# Patient Record
Sex: Male | Born: 1940
Health system: Southern US, Community
[De-identification: ages and names within clinical notes are randomized; demographics above are authoritative.]

## PROBLEM LIST (undated history)

## (undated) DIAGNOSIS — N2889 Other specified disorders of kidney and ureter: Secondary | ICD-10-CM

## (undated) DIAGNOSIS — K635 Polyp of colon: Secondary | ICD-10-CM

## (undated) DIAGNOSIS — S42009A Fracture of unspecified part of unspecified clavicle, initial encounter for closed fracture: Secondary | ICD-10-CM

## (undated) DIAGNOSIS — Z8719 Personal history of other diseases of the digestive system: Secondary | ICD-10-CM

## (undated) DIAGNOSIS — G4730 Sleep apnea, unspecified: Secondary | ICD-10-CM

## (undated) DIAGNOSIS — C44509 Unspecified malignant neoplasm of skin of other part of trunk: Secondary | ICD-10-CM

## (undated) DIAGNOSIS — M1990 Unspecified osteoarthritis, unspecified site: Secondary | ICD-10-CM

## (undated) DIAGNOSIS — I499 Cardiac arrhythmia, unspecified: Secondary | ICD-10-CM

## (undated) DIAGNOSIS — R609 Edema, unspecified: Secondary | ICD-10-CM

## (undated) DIAGNOSIS — R519 Headache, unspecified: Secondary | ICD-10-CM

## (undated) DIAGNOSIS — R7303 Prediabetes: Secondary | ICD-10-CM

## (undated) DIAGNOSIS — I10 Essential (primary) hypertension: Secondary | ICD-10-CM

## (undated) HISTORY — DX: Essential (primary) hypertension: I10

## (undated) HISTORY — PX: OTHER SURGICAL HISTORY: SHX169

## (undated) HISTORY — DX: Unspecified osteoarthritis, unspecified site: M19.90

## (undated) HISTORY — DX: Other specified disorders of kidney and ureter: N28.89

## (undated) HISTORY — PX: FOOT SURGERY: SHX648

## (undated) HISTORY — DX: Polyp of colon: K63.5

## (undated) HISTORY — PX: SKIN CANCER EXCISION: SHX779

---

## 1968-01-22 HISTORY — PX: HERNIA REPAIR: SHX51

## 1968-01-22 HISTORY — PX: CHOLECYSTECTOMY: SHX55

## 1968-01-22 HISTORY — PX: FRACTURE SURGERY: SHX138

## 2001-01-21 DIAGNOSIS — K635 Polyp of colon: Secondary | ICD-10-CM

## 2001-01-21 HISTORY — DX: Polyp of colon: K63.5

## 2005-03-18 ENCOUNTER — Ambulatory Visit: Payer: Self-pay | Admitting: General Surgery

## 2008-03-22 ENCOUNTER — Ambulatory Visit: Payer: Self-pay | Admitting: General Surgery

## 2008-03-22 HISTORY — PX: COLONOSCOPY: SHX174

## 2008-03-22 LAB — HM COLONOSCOPY: HM Colonoscopy: NORMAL

## 2009-01-21 HISTORY — PX: BUNIONECTOMY WITH HAMMERTOE RECONSTRUCTION: SHX5600

## 2011-01-28 ENCOUNTER — Ambulatory Visit: Payer: Self-pay | Admitting: Family Medicine

## 2011-01-28 DIAGNOSIS — Z79899 Other long term (current) drug therapy: Secondary | ICD-10-CM | POA: Diagnosis not present

## 2011-01-28 DIAGNOSIS — M79609 Pain in unspecified limb: Secondary | ICD-10-CM | POA: Diagnosis not present

## 2011-01-28 DIAGNOSIS — M129 Arthropathy, unspecified: Secondary | ICD-10-CM | POA: Diagnosis not present

## 2011-01-28 DIAGNOSIS — N400 Enlarged prostate without lower urinary tract symptoms: Secondary | ICD-10-CM | POA: Diagnosis not present

## 2011-01-28 DIAGNOSIS — I10 Essential (primary) hypertension: Secondary | ICD-10-CM | POA: Diagnosis not present

## 2011-02-28 DIAGNOSIS — M79609 Pain in unspecified limb: Secondary | ICD-10-CM | POA: Diagnosis not present

## 2011-02-28 DIAGNOSIS — M545 Low back pain: Secondary | ICD-10-CM | POA: Diagnosis not present

## 2011-02-28 DIAGNOSIS — I10 Essential (primary) hypertension: Secondary | ICD-10-CM | POA: Diagnosis not present

## 2011-02-28 DIAGNOSIS — N400 Enlarged prostate without lower urinary tract symptoms: Secondary | ICD-10-CM | POA: Diagnosis not present

## 2011-07-30 DIAGNOSIS — Z0000 Encounter for general adult medical examination without abnormal findings: Secondary | ICD-10-CM | POA: Diagnosis not present

## 2011-07-30 DIAGNOSIS — Z136 Encounter for screening for cardiovascular disorders: Secondary | ICD-10-CM | POA: Diagnosis not present

## 2011-07-30 DIAGNOSIS — Z23 Encounter for immunization: Secondary | ICD-10-CM | POA: Diagnosis not present

## 2011-07-30 DIAGNOSIS — N400 Enlarged prostate without lower urinary tract symptoms: Secondary | ICD-10-CM | POA: Diagnosis not present

## 2011-07-30 DIAGNOSIS — M79609 Pain in unspecified limb: Secondary | ICD-10-CM | POA: Diagnosis not present

## 2011-07-30 DIAGNOSIS — I10 Essential (primary) hypertension: Secondary | ICD-10-CM | POA: Diagnosis not present

## 2011-07-30 DIAGNOSIS — Z125 Encounter for screening for malignant neoplasm of prostate: Secondary | ICD-10-CM | POA: Diagnosis not present

## 2011-09-16 DIAGNOSIS — H2510 Age-related nuclear cataract, unspecified eye: Secondary | ICD-10-CM | POA: Diagnosis not present

## 2011-12-17 DIAGNOSIS — L570 Actinic keratosis: Secondary | ICD-10-CM | POA: Diagnosis not present

## 2011-12-17 DIAGNOSIS — L821 Other seborrheic keratosis: Secondary | ICD-10-CM | POA: Diagnosis not present

## 2012-01-02 DIAGNOSIS — Z23 Encounter for immunization: Secondary | ICD-10-CM | POA: Diagnosis not present

## 2012-01-02 DIAGNOSIS — N400 Enlarged prostate without lower urinary tract symptoms: Secondary | ICD-10-CM | POA: Diagnosis not present

## 2012-01-27 DIAGNOSIS — I10 Essential (primary) hypertension: Secondary | ICD-10-CM | POA: Diagnosis not present

## 2012-01-27 DIAGNOSIS — R51 Headache: Secondary | ICD-10-CM | POA: Diagnosis not present

## 2012-01-27 DIAGNOSIS — E669 Obesity, unspecified: Secondary | ICD-10-CM | POA: Diagnosis not present

## 2012-01-27 DIAGNOSIS — M129 Arthropathy, unspecified: Secondary | ICD-10-CM | POA: Diagnosis not present

## 2012-01-31 ENCOUNTER — Ambulatory Visit: Payer: Self-pay | Admitting: Family Medicine

## 2012-01-31 DIAGNOSIS — R51 Headache: Secondary | ICD-10-CM | POA: Diagnosis not present

## 2012-02-10 DIAGNOSIS — R9431 Abnormal electrocardiogram [ECG] [EKG]: Secondary | ICD-10-CM | POA: Diagnosis not present

## 2012-02-10 DIAGNOSIS — I10 Essential (primary) hypertension: Secondary | ICD-10-CM | POA: Diagnosis not present

## 2012-02-10 DIAGNOSIS — E669 Obesity, unspecified: Secondary | ICD-10-CM | POA: Diagnosis not present

## 2012-02-10 DIAGNOSIS — I498 Other specified cardiac arrhythmias: Secondary | ICD-10-CM | POA: Diagnosis not present

## 2012-02-12 DIAGNOSIS — I10 Essential (primary) hypertension: Secondary | ICD-10-CM | POA: Diagnosis not present

## 2012-02-12 DIAGNOSIS — E785 Hyperlipidemia, unspecified: Secondary | ICD-10-CM | POA: Diagnosis not present

## 2012-02-12 DIAGNOSIS — I4949 Other premature depolarization: Secondary | ICD-10-CM | POA: Diagnosis not present

## 2012-02-12 DIAGNOSIS — R9431 Abnormal electrocardiogram [ECG] [EKG]: Secondary | ICD-10-CM | POA: Diagnosis not present

## 2012-02-12 DIAGNOSIS — R002 Palpitations: Secondary | ICD-10-CM | POA: Diagnosis not present

## 2012-02-18 DIAGNOSIS — I209 Angina pectoris, unspecified: Secondary | ICD-10-CM | POA: Diagnosis not present

## 2012-02-18 DIAGNOSIS — I4949 Other premature depolarization: Secondary | ICD-10-CM | POA: Diagnosis not present

## 2012-02-20 DIAGNOSIS — I209 Angina pectoris, unspecified: Secondary | ICD-10-CM | POA: Diagnosis not present

## 2012-02-20 DIAGNOSIS — R002 Palpitations: Secondary | ICD-10-CM | POA: Diagnosis not present

## 2012-02-20 DIAGNOSIS — I4949 Other premature depolarization: Secondary | ICD-10-CM | POA: Diagnosis not present

## 2012-02-20 DIAGNOSIS — R9431 Abnormal electrocardiogram [ECG] [EKG]: Secondary | ICD-10-CM | POA: Diagnosis not present

## 2012-02-27 DIAGNOSIS — I10 Essential (primary) hypertension: Secondary | ICD-10-CM | POA: Diagnosis not present

## 2012-02-27 DIAGNOSIS — I059 Rheumatic mitral valve disease, unspecified: Secondary | ICD-10-CM | POA: Diagnosis not present

## 2012-02-27 DIAGNOSIS — I4949 Other premature depolarization: Secondary | ICD-10-CM | POA: Diagnosis not present

## 2012-06-23 DIAGNOSIS — I10 Essential (primary) hypertension: Secondary | ICD-10-CM | POA: Diagnosis not present

## 2012-06-23 DIAGNOSIS — I498 Other specified cardiac arrhythmias: Secondary | ICD-10-CM | POA: Diagnosis not present

## 2012-06-23 DIAGNOSIS — E669 Obesity, unspecified: Secondary | ICD-10-CM | POA: Diagnosis not present

## 2012-06-23 DIAGNOSIS — R011 Cardiac murmur, unspecified: Secondary | ICD-10-CM | POA: Diagnosis not present

## 2012-09-16 DIAGNOSIS — H2510 Age-related nuclear cataract, unspecified eye: Secondary | ICD-10-CM | POA: Diagnosis not present

## 2012-10-22 DIAGNOSIS — I10 Essential (primary) hypertension: Secondary | ICD-10-CM | POA: Diagnosis not present

## 2012-10-22 DIAGNOSIS — I498 Other specified cardiac arrhythmias: Secondary | ICD-10-CM | POA: Diagnosis not present

## 2012-10-22 DIAGNOSIS — Z23 Encounter for immunization: Secondary | ICD-10-CM | POA: Diagnosis not present

## 2012-10-22 DIAGNOSIS — Z0000 Encounter for general adult medical examination without abnormal findings: Secondary | ICD-10-CM | POA: Diagnosis not present

## 2012-10-22 DIAGNOSIS — Z125 Encounter for screening for malignant neoplasm of prostate: Secondary | ICD-10-CM | POA: Diagnosis not present

## 2012-10-22 DIAGNOSIS — E669 Obesity, unspecified: Secondary | ICD-10-CM | POA: Diagnosis not present

## 2012-10-22 DIAGNOSIS — Z1331 Encounter for screening for depression: Secondary | ICD-10-CM | POA: Diagnosis not present

## 2012-10-22 DIAGNOSIS — Z1339 Encounter for screening examination for other mental health and behavioral disorders: Secondary | ICD-10-CM | POA: Diagnosis not present

## 2012-12-16 DIAGNOSIS — D235 Other benign neoplasm of skin of trunk: Secondary | ICD-10-CM | POA: Diagnosis not present

## 2012-12-16 DIAGNOSIS — L570 Actinic keratosis: Secondary | ICD-10-CM | POA: Diagnosis not present

## 2012-12-16 DIAGNOSIS — L821 Other seborrheic keratosis: Secondary | ICD-10-CM | POA: Diagnosis not present

## 2013-03-01 DIAGNOSIS — I050 Rheumatic mitral stenosis: Secondary | ICD-10-CM | POA: Diagnosis not present

## 2013-03-01 DIAGNOSIS — I10 Essential (primary) hypertension: Secondary | ICD-10-CM | POA: Diagnosis not present

## 2013-03-01 DIAGNOSIS — K2100 Gastro-esophageal reflux disease with esophagitis, without bleeding: Secondary | ICD-10-CM | POA: Diagnosis not present

## 2013-03-01 DIAGNOSIS — E785 Hyperlipidemia, unspecified: Secondary | ICD-10-CM | POA: Diagnosis not present

## 2013-03-24 ENCOUNTER — Ambulatory Visit (INDEPENDENT_AMBULATORY_CARE_PROVIDER_SITE_OTHER): Payer: Medicare Other | Admitting: General Surgery

## 2013-03-24 ENCOUNTER — Encounter: Payer: Self-pay | Admitting: General Surgery

## 2013-03-24 VITALS — BP 130/72 | HR 76 | Resp 12 | Ht 71.0 in | Wt 268.0 lb

## 2013-03-24 DIAGNOSIS — Z86010 Personal history of colonic polyps: Secondary | ICD-10-CM | POA: Diagnosis not present

## 2013-03-24 MED ORDER — POLYETHYLENE GLYCOL 3350 17 GM/SCOOP PO POWD: ORAL | Status: DC

## 2013-03-24 NOTE — Patient Instructions (Addendum)
Colonoscopy A colonoscopy is an exam to look at the entire large intestine (colon). This exam can help find problems such as tumors, polyps, inflammation, and areas of bleeding. The exam takes about 1 hour.  LET [facility] CARE PROVIDER KNOW ABOUT:   Any allergies you have.  All medicines you are taking, including vitamins, herbs, eye drops, creams, and over-the-counter medicines.  Previous problems you or members of your family have had with the use of anesthetics.  Any blood disorders you have.  Previous surgeries you have had.  Medical conditions you have. RISKS AND COMPLICATIONS  Generally, this is a safe procedure. However, as with any procedure, complications can occur. Possible complications include:  Bleeding.  Tearing or rupture of the colon wall.  Reaction to medicines given during the exam.  Infection (rare). BEFORE THE PROCEDURE   Ask your health care provider about changing or stopping your regular medicines.  You may be prescribed an oral bowel prep. This involves drinking a large amount of medicated liquid, starting the day before your procedure. The liquid will cause you to have multiple loose stools until your stool is almost clear or light green. This cleans out your colon in preparation for the procedure.  Do not eat or drink anything else once you have started the bowel prep, unless your health care provider tells you it is safe to do so.  Arrange for someone to drive you home after the procedure. PROCEDURE   You will be given medicine to help you relax (sedative).  You will lie on your side with your knees bent.  A long, flexible tube with a light and camera on the end (colonoscope) will be inserted through the rectum and into the colon. The camera sends video back to a computer screen as it moves through the colon. The colonoscope also releases carbon dioxide gas to inflate the colon. This helps your health care provider see the area better.  During  the exam, your health care provider may take a small tissue sample (biopsy) to be examined under a microscope if any abnormalities are found.  The exam is finished when the entire colon has been viewed. AFTER THE PROCEDURE   Do not drive for 24 hours after the exam.  You may have a small amount of blood in your stool.  You may pass moderate amounts of gas and have mild abdominal cramping or bloating. This is caused by the gas used to inflate your colon during the exam.  Ask when your test results will be ready and how you will get your results. Make sure you get your test results. Document Released: 01/05/2000 Document Revised: 10/28/2012 Document Reviewed: 09/14/2012 [facility] Patient Information 2014 [place].  Patient has been scheduled for a colonoscopy on 04-13-13 at [facility]. This patient has been asked to decrease aspirin to an 81 mg aspirin starting one week prior to procedure.

## 2013-03-24 NOTE — Progress Notes (Signed)
Patient ID: Patrick Thornton, male   DOB: 09-09-1940, 73 y.o.   MRN: [id number]  Chief Complaint  Patient presents with  . Colonoscopy    HPI Patrick Thornton is a 73 y.o. male who presents for an evaluation of a colonoscopy. His last colonoscopy was performed in 2010. Bowels move daily with no complications.   Normal stress test and echocardiogram in 2014 with Dr Nehemiah Massed.  HPI  Past Medical History  Diagnosis Date  . Arthritis   . Hypertension   . Colon polyp 2003    Past Surgical History  Procedure Laterality Date  . Hernia repair    . Cholecystectomy    . Colonoscopy  03-22-08    Dr Bary Castilla    Family History  Problem Relation Age of Onset  . Stroke Mother   . Cancer Father     melanoma    Social History History  Substance Use Topics  . Smoking status: Former Smoker -- 40 years  . Smokeless tobacco: Never Used  . Alcohol Use: No    No Known Allergies  Current Outpatient Prescriptions  Medication Sig Dispense Refill  . Cholecalciferol (VITAMIN D-3 PO) Take 1 tablet by mouth daily.      . Cyanocobalamin (VITAMIN B-12 PO) Take 1 tablet by mouth daily.      Marland Kitchen doxazosin (CARDURA) 8 MG tablet       . furosemide (LASIX) 20 MG tablet       . ibuprofen (ADVIL,MOTRIN) 800 MG tablet every 8 (eight) hours as needed.       . Multiple Vitamin (MULTIVITAMIN) tablet Take 1 tablet by mouth daily.      . quinapril (ACCUPRIL) 20 MG tablet       . temazepam (RESTORIL) 30 MG capsule at bedtime as needed.       . polyethylene glycol powder (GLYCOLAX/MIRALAX) powder 255 grams one bottle for colonoscopy prep  255 g  0   No current facility-administered medications for this visit.    Review of Systems Review of Systems  Constitutional: Negative.   Respiratory: Negative.   Cardiovascular: Negative.   Gastrointestinal: Negative for nausea, vomiting, diarrhea and blood in stool.    Blood pressure 130/72, pulse 76, resp. rate 12, height 5\' 11"  (1.803 m), weight 268 lb (121.564  kg).  Physical Exam Physical Exam  Constitutional: He is oriented to person, place, and time. He appears well-developed and well-nourished.  Neck: Neck supple.  Cardiovascular: Normal rate, regular rhythm and normal heart sounds.   Pulmonary/Chest: Effort normal and breath sounds normal.  Lymphadenopathy:    He has no cervical adenopathy.  Neurological: He is alert and oriented to person, place, and time.  Skin: Skin is warm and dry.    Data Reviewed Colonoscopy completed March 2010 with Versed and fentanyl showed no recurrent polyps.  2007 colonoscopy showed mucosa with hyperplastic changes.  January 2003 colonoscopy showed a large tubulovillous adenoma of the rectum at 3 cm. The largest fragment measured 1 cm in diameter.  Assessment    Previous tubulovillous no lobe the rectum 2003.  No current lower GI symptoms.     Plan    The patient is a candidate for a follow up exam based on the size of the 2003 polyp. This will be a screening exam. Colonoscopy with possible biopsy/polypectomy prn: Information regarding the procedure, including its potential risks and complications (including but not limited to perforation of the bowel, which may require emergency surgery to repair, and bleeding) was verbally given  to the patient. Educational information regarding lower instestinal endoscopy was given to the patient. Written instructions for how to complete the bowel prep using Miralax were provided. The importance of drinking ample fluids to avoid dehydration as a result of the prep emphasized.     Patient has been scheduled for a colonoscopy on 04-13-13 at [facility]. This patient has been asked to decrease aspirin to an 81 mg aspirin starting one week prior to procedure.    Robert Bellow 03/25/2013, 4:56 PM

## 2013-03-25 ENCOUNTER — Other Ambulatory Visit: Payer: Self-pay | Admitting: General Surgery

## 2013-03-25 DIAGNOSIS — Z1211 Encounter for screening for malignant neoplasm of colon: Secondary | ICD-10-CM

## 2013-03-25 DIAGNOSIS — Z860100 Personal history of colon polyps, unspecified: Secondary | ICD-10-CM | POA: Insufficient documentation

## 2013-04-13 ENCOUNTER — Ambulatory Visit: Payer: Self-pay | Admitting: General Surgery

## 2013-04-13 DIAGNOSIS — K639 Disease of intestine, unspecified: Secondary | ICD-10-CM | POA: Diagnosis not present

## 2013-04-13 DIAGNOSIS — Z823 Family history of stroke: Secondary | ICD-10-CM | POA: Diagnosis not present

## 2013-04-13 DIAGNOSIS — Z79899 Other long term (current) drug therapy: Secondary | ICD-10-CM | POA: Diagnosis not present

## 2013-04-13 DIAGNOSIS — D129 Benign neoplasm of anus and anal canal: Secondary | ICD-10-CM

## 2013-04-13 DIAGNOSIS — Z86010 Personal history of colonic polyps: Secondary | ICD-10-CM | POA: Diagnosis not present

## 2013-04-13 DIAGNOSIS — Z808 Family history of malignant neoplasm of other organs or systems: Secondary | ICD-10-CM | POA: Diagnosis not present

## 2013-04-13 DIAGNOSIS — Z87891 Personal history of nicotine dependence: Secondary | ICD-10-CM | POA: Diagnosis not present

## 2013-04-13 DIAGNOSIS — D128 Benign neoplasm of rectum: Secondary | ICD-10-CM | POA: Diagnosis not present

## 2013-04-13 DIAGNOSIS — Z1211 Encounter for screening for malignant neoplasm of colon: Secondary | ICD-10-CM | POA: Diagnosis not present

## 2013-04-13 DIAGNOSIS — K6389 Other specified diseases of intestine: Secondary | ICD-10-CM | POA: Diagnosis not present

## 2013-04-13 DIAGNOSIS — I10 Essential (primary) hypertension: Secondary | ICD-10-CM | POA: Diagnosis not present

## 2013-04-13 DIAGNOSIS — M129 Arthropathy, unspecified: Secondary | ICD-10-CM | POA: Diagnosis not present

## 2013-04-14 ENCOUNTER — Encounter: Payer: Self-pay | Admitting: General Surgery

## 2013-04-16 ENCOUNTER — Encounter: Payer: Self-pay | Admitting: General Surgery

## 2013-04-20 ENCOUNTER — Telehealth: Payer: Self-pay | Admitting: *Deleted

## 2013-04-20 ENCOUNTER — Encounter: Payer: Self-pay | Admitting: General Surgery

## 2013-04-20 LAB — PATHOLOGY REPORT

## 2013-04-20 NOTE — Telephone Encounter (Signed)
Message copied by Carson Myrtle on Tue Apr 20, 2013  4:53 PM ------      Message from: Robert Bellow      Created: Tue Apr 20, 2013  1:08 PM       Please notify the patient the biopsies were fine.  F/U exams if any problems. (We stop screening at age 73) ------

## 2013-04-20 NOTE — Telephone Encounter (Signed)
Notified patient wife as instructed, she was pleased. Discussed follow-up appointments on an as needed basis, she agrees.

## 2013-10-27 DIAGNOSIS — Z1212 Encounter for screening for malignant neoplasm of rectum: Secondary | ICD-10-CM | POA: Diagnosis not present

## 2013-10-27 DIAGNOSIS — Z23 Encounter for immunization: Secondary | ICD-10-CM | POA: Diagnosis not present

## 2013-10-27 DIAGNOSIS — Z0000 Encounter for general adult medical examination without abnormal findings: Secondary | ICD-10-CM | POA: Diagnosis not present

## 2013-10-27 DIAGNOSIS — Z125 Encounter for screening for malignant neoplasm of prostate: Secondary | ICD-10-CM | POA: Diagnosis not present

## 2013-10-27 DIAGNOSIS — Z1389 Encounter for screening for other disorder: Secondary | ICD-10-CM | POA: Diagnosis not present

## 2013-11-25 DIAGNOSIS — L821 Other seborrheic keratosis: Secondary | ICD-10-CM | POA: Diagnosis not present

## 2013-11-25 DIAGNOSIS — D225 Melanocytic nevi of trunk: Secondary | ICD-10-CM | POA: Diagnosis not present

## 2013-11-25 DIAGNOSIS — D2272 Melanocytic nevi of left lower limb, including hip: Secondary | ICD-10-CM | POA: Diagnosis not present

## 2013-11-25 DIAGNOSIS — D2261 Melanocytic nevi of right upper limb, including shoulder: Secondary | ICD-10-CM | POA: Diagnosis not present

## 2013-11-25 DIAGNOSIS — L570 Actinic keratosis: Secondary | ICD-10-CM | POA: Diagnosis not present

## 2013-11-29 DIAGNOSIS — I340 Nonrheumatic mitral (valve) insufficiency: Secondary | ICD-10-CM | POA: Insufficient documentation

## 2013-11-29 DIAGNOSIS — R002 Palpitations: Secondary | ICD-10-CM | POA: Diagnosis not present

## 2013-11-29 DIAGNOSIS — I10 Essential (primary) hypertension: Secondary | ICD-10-CM | POA: Diagnosis not present

## 2014-04-26 LAB — BASIC METABOLIC PANEL WITH GFR
BUN: 11 mg/dL (ref 4–21)
Creatinine: 1 mg/dL (ref 0.6–1.3)
Glucose: 100 mg/dL
Potassium: 4.3 mmol/L (ref 3.4–5.3)
Sodium: 142 mmol/L (ref 137–147)

## 2014-04-26 LAB — CBC AND DIFFERENTIAL
HEMATOCRIT: 42 % (ref 41–53)
Hemoglobin: 14.3 g/dL (ref 13.5–17.5)
NEUTROS ABS: 4 /uL
Platelets: 243 10*3/uL (ref 150–399)
WBC: 6.9 10*3/mL

## 2014-04-26 LAB — HEPATIC FUNCTION PANEL
ALT: 16 U/L (ref 10–40)
AST: 19 U/L (ref 14–40)
Alkaline Phosphatase: 86 U/L (ref 25–125)

## 2014-04-26 LAB — LIPID PANEL
Cholesterol: 142 mg/dL (ref 0–200)
HDL: 38 mg/dL (ref 35–70)
LDL Cholesterol: 94 mg/dL
LDl/HDL Ratio: 2.5
Triglycerides: 51 mg/dL (ref 40–160)

## 2014-04-26 LAB — TSH: TSH: 5.13 u[IU]/mL (ref 0.41–5.90)

## 2014-11-01 ENCOUNTER — Ambulatory Visit (INDEPENDENT_AMBULATORY_CARE_PROVIDER_SITE_OTHER): Payer: PPO | Admitting: Family Medicine

## 2014-11-01 ENCOUNTER — Encounter: Payer: Self-pay | Admitting: Family Medicine

## 2014-11-01 VITALS — BP 130/64 | HR 64 | Temp 97.7°F | Resp 16 | Ht 71.0 in | Wt 271.0 lb

## 2014-11-01 DIAGNOSIS — Z23 Encounter for immunization: Secondary | ICD-10-CM | POA: Diagnosis not present

## 2014-11-01 DIAGNOSIS — N400 Enlarged prostate without lower urinary tract symptoms: Secondary | ICD-10-CM | POA: Insufficient documentation

## 2014-11-01 DIAGNOSIS — E669 Obesity, unspecified: Secondary | ICD-10-CM | POA: Insufficient documentation

## 2014-11-01 DIAGNOSIS — Z0000 Encounter for general adult medical examination without abnormal findings: Secondary | ICD-10-CM | POA: Diagnosis not present

## 2014-11-01 DIAGNOSIS — G4700 Insomnia, unspecified: Secondary | ICD-10-CM | POA: Insufficient documentation

## 2014-11-01 DIAGNOSIS — M1990 Unspecified osteoarthritis, unspecified site: Secondary | ICD-10-CM | POA: Insufficient documentation

## 2014-11-01 DIAGNOSIS — N529 Male erectile dysfunction, unspecified: Secondary | ICD-10-CM | POA: Insufficient documentation

## 2014-11-01 DIAGNOSIS — K219 Gastro-esophageal reflux disease without esophagitis: Secondary | ICD-10-CM | POA: Insufficient documentation

## 2014-11-01 DIAGNOSIS — E785 Hyperlipidemia, unspecified: Secondary | ICD-10-CM | POA: Insufficient documentation

## 2014-11-01 DIAGNOSIS — I10 Essential (primary) hypertension: Secondary | ICD-10-CM | POA: Insufficient documentation

## 2014-11-01 NOTE — Progress Notes (Signed)
Patient ID: Patrick Thornton, male   DOB: 24-Aug-1940, 74 y.o.   MRN: [id number] Patient: Patrick Thornton, Male    DOB: 06-28-1940, 74 y.o.   MRN: [id number] Visit Date: 11/01/2014  Today's Provider: [name], MD   Chief Complaint  Patient presents with  . Annual Exam   Subjective:   Patrick Thornton is a 74 y.o. male who presents today for his Subsequent Annual Wellness Visit. He feels well. He reports he is not exercising. He reports he is sleeping well.  Review of Systems  Constitutional: Negative.   HENT: Positive for tinnitus.   Eyes: Positive for redness.  Respiratory: Negative.   Cardiovascular: Positive for leg swelling.  Gastrointestinal: Negative.   Endocrine: Negative.   Genitourinary: Negative.   Musculoskeletal: Positive for arthralgias.  Skin: Negative.   Allergic/Immunologic: Negative.   Neurological: Negative.   Hematological: Bruises/bleeds easily.  Psychiatric/Behavioral: Negative.     Patient Active Problem List   Diagnosis Date Noted  . Benign fibroma of prostate 11/01/2014  . ED (erectile dysfunction) of organic origin 11/01/2014  . Acid reflux 11/01/2014  . HLD (hyperlipidemia) 11/01/2014  . BP (high blood pressure) 11/01/2014  . Adiposity 11/01/2014  . Arthritis, degenerative 11/01/2014  . Cannot sleep 11/01/2014  . MI (mitral incompetence) 11/29/2013  . Personal history of colonic polyps 03/25/2013    Social History   Social History  . Marital Status: Married    Spouse Name: N/A  . Number of Children: N/A  . Years of Education: N/A   Occupational History  . Not on file.   Social History Main Topics  . Smoking status: Former Smoker -- 40 years  . Smokeless tobacco: Never Used  . Alcohol Use: No  . Drug Use: No  . Sexual Activity: Not on file   Other Topics Concern  . Not on file   Social History Narrative    Past Surgical History  Procedure Laterality Date  . Hernia repair    . Cholecystectomy    . Colonoscopy  03-22-08     Dr Bary Castilla  . Other surgical history      transanal excision of a villous adenoma  . Fracture surgery      His family history includes CAD in his brother; COPD in his son; Cancer in his father; Diabetes in his father and son; Heart attack in his father and son; Hyperlipidemia in his brother; Hypertension in his father; Obesity in his son; Stroke in his mother.    Outpatient Prescriptions Prior to Visit  Medication Sig Dispense Refill  . Ascorbic Acid (VITAMIN C) 100 MG tablet Take by mouth.    Marland Kitchen aspirin 81 MG tablet Take by mouth.    . Cholecalciferol (VITAMIN D-3 PO) Take 1 tablet by mouth daily.    . cyanocobalamin 1000 MCG tablet Take by mouth.    . doxazosin (CARDURA) 8 MG tablet     . furosemide (LASIX) 20 MG tablet     . ibuprofen (ADVIL,MOTRIN) 800 MG tablet every 8 (eight) hours as needed.     . Multiple Vitamin tablet Take by mouth.    . polyethylene glycol powder (GLYCOLAX/MIRALAX) powder 255 grams one bottle for colonoscopy prep 255 g 0  . quinapril (ACCUPRIL) 20 MG tablet     . temazepam (RESTORIL) 30 MG capsule at bedtime as needed.     . vitamin E 200 UNIT capsule Take by mouth.    . Cinnamon 500 MG capsule Take by mouth.    Marland Kitchen  Cyanocobalamin (VITAMIN B-12 PO) Take 1 tablet by mouth daily.    . Multiple Vitamin (MULTIVITAMIN) tablet Take 1 tablet by mouth daily.     No facility-administered medications prior to visit.    No Known Allergies  Patient Care Team: Jerrol Banana., MD as PCP - General (Family Medicine) Robert Bellow, MD (General Surgery)  Objective:   Vitals:  Filed Vitals:   11/01/14 1106  BP: 130/64  Pulse: 64  Temp: 97.7 F (36.5 C)  TempSrc: Oral  Resp: 16  Height: 5\' 11"  (1.803 m)  Weight: 271 lb (122.925 kg)    Physical Exam  Constitutional: He is oriented to person, place, and time. He appears well-developed and well-nourished.  Moderately obese white male in no acute distress  HENT:  Head: Normocephalic and  atraumatic.  Right Ear: External ear normal.  Left Ear: External ear normal.  Nose: Nose normal.  Mouth/Throat: Oropharynx is clear and moist.  Eyes: Conjunctivae are normal.  Neck: Neck supple.  Cardiovascular: Normal rate, regular rhythm, normal heart sounds and intact distal pulses.   Pulmonary/Chest: Effort normal and breath sounds normal.  Abdominal: Soft.  Neurological: He is oriented to person, place, and time.  Skin: Skin is warm and dry.  Psychiatric: He has a normal mood and affect. His behavior is normal. Judgment and thought content normal.    Activities of Daily Living In your present state of health, do you have any difficulty performing the following activities: 11/01/2014  Hearing? N  Vision? N  Difficulty concentrating or making decisions? N  Walking or climbing stairs? N  Dressing or bathing? N  Doing errands, shopping? N    Fall Risk Assessment Fall Risk  11/01/2014  Falls in the past year? No     Depression Screen PHQ 2/9 Scores 11/01/2014  PHQ - 2 Score 0    Cognitive Testing - 6-CIT    Year: 0 points  Month: 0 points  Memorize "Pia Mau, [address], [place]"  Time (within 1 hour:) 0 points  Count backwards from 20: 0  points  Name months of year: 0  points  Repeat Address: 0 points   Total Score: 0/28  Interpretation : Normal (0-7) Abnormal (8-28)    Assessment & Plan:     Annual Wellness Visit  Reviewed patient's Family Medical History Reviewed and updated list of patient's medical providers Assessment of cognitive impairment was done Assessed patient's functional ability Established a written schedule for health screening [place] Completed and Reviewed  Exercise Activities and Dietary recommendations Goals    None      Immunization History  Administered Date(s) Administered  . Influenza-Unspecified 10/21/2013  . Pneumococcal Conjugate-13 10/27/2013  . Pneumococcal Polysaccharide-23 07/30/2011     Health Maintenance  Topic Date Due  . TETANUS/TDAP  10/10/1960  . ZOSTAVAX  10/10/2001  . PNA vac Low Risk Adult (1 of 2 - PCV13) 10/11/2006  . INFLUENZA VACCINE  08/22/2014  . COLONOSCOPY  04/14/2023   Prevnar 10/27/13. Colonoscopy 03/22/08--5 year f/u so time to repeat if pt willing. Discussed health benefits of physical activity, and encouraged him to engage in regular exercise appropriate for his age and condition.    Miguel Aschoff MD [facility] 11/01/2014 11:10 AM  ------------------------------------------------------------------------------------------------------------

## 2015-01-19 ENCOUNTER — Other Ambulatory Visit: Payer: Self-pay | Admitting: Family Medicine

## 2015-02-20 ENCOUNTER — Ambulatory Visit (INDEPENDENT_AMBULATORY_CARE_PROVIDER_SITE_OTHER): Payer: PPO | Admitting: Family Medicine

## 2015-02-20 VITALS — BP 134/74 | HR 64 | Temp 98.2°F | Resp 16 | Wt 275.0 lb

## 2015-02-20 DIAGNOSIS — J019 Acute sinusitis, unspecified: Secondary | ICD-10-CM

## 2015-02-20 MED ORDER — DOXYCYCLINE HYCLATE 100 MG PO TABS: 100.0000 mg | ORAL_TABLET | Freq: Two times a day (BID) | ORAL | Status: DC

## 2015-02-20 MED ORDER — HYDROCODONE-HOMATROPINE 5-1.5 MG/5ML PO SYRP: 5.0000 mL | ORAL_SOLUTION | Freq: Three times a day (TID) | ORAL | Status: DC | PRN

## 2015-02-20 NOTE — Progress Notes (Signed)
Patient ID: Patrick Thornton, male   DOB: January 23, 1940, 75 y.o.   MRN: [id number]   Patrick Thornton  MRN: [id number] DOB: 11-05-1940  Subjective:  HPI   1. Acute sinusitis, recurrence not specified, unspecified location The patient is a 75 year old male who presents for evaluation of possible sinusitis.  He states his symptoms have been present for 1 week.  The include congestion, cough from drainage, eye and teeth pain, ribs sore from coughing so much and the cough is keeping him awake at night.  Patient Active Problem List   Diagnosis Date Noted  . Benign fibroma of prostate 11/01/2014  . ED (erectile dysfunction) of organic origin 11/01/2014  . Acid reflux 11/01/2014  . HLD (hyperlipidemia) 11/01/2014  . BP (high blood pressure) 11/01/2014  . Adiposity 11/01/2014  . Arthritis, degenerative 11/01/2014  . Cannot sleep 11/01/2014  . MI (mitral incompetence) 11/29/2013  . Personal history of colonic polyps 03/25/2013    Past Medical History  Diagnosis Date  . Arthritis   . Hypertension   . Colon polyp 2003    Social History   Social History  . Marital Status: Married    Spouse Name: N/A  . Number of Children: N/A  . Years of Education: N/A   Occupational History  . Not on file.   Social History Main Topics  . Smoking status: Former Smoker -- 40 years  . Smokeless tobacco: Never Used  . Alcohol Use: No  . Drug Use: No  . Sexual Activity: Not on file   Other Topics Concern  . Not on file   Social History Narrative    Outpatient Prescriptions Prior to Visit  Medication Sig Dispense Refill  . Ascorbic Acid (VITAMIN C) 100 MG tablet Take by mouth.    Marland Kitchen aspirin 81 MG tablet Take by mouth.    . Cholecalciferol (VITAMIN D-3 PO) Take 1 tablet by mouth daily.    . cyanocobalamin 1000 MCG tablet Take by mouth.    . doxazosin (CARDURA) 8 MG tablet     . furosemide (LASIX) 20 MG tablet     . ibuprofen (ADVIL,MOTRIN) 800 MG tablet every 8 (eight) hours as needed.     .  Multiple Vitamin tablet Take by mouth.    . polyethylene glycol powder (GLYCOLAX/MIRALAX) powder 255 grams one bottle for colonoscopy prep 255 g 0  . quinapril (ACCUPRIL) 20 MG tablet TAKE 1 TABLET BY MOUTH ONCE A DAY 30 tablet 5  . temazepam (RESTORIL) 30 MG capsule at bedtime as needed.     . vitamin E 200 UNIT capsule Take by mouth.     No facility-administered medications prior to visit.    No Known Allergies  Review of Systems  Constitutional: Positive for chills, malaise/fatigue and diaphoresis. Negative for fever.  HENT: Positive for congestion (with blood when he blows), sore throat (from mouth breathing his throat gets dry) and tinnitus (chronic and unchanged). Negative for ear discharge, ear pain, hearing loss and nosebleeds.   Eyes: Positive for pain and discharge. Negative for blurred vision, double vision, photophobia and redness.  Respiratory: Positive for cough and wheezing. Negative for hemoptysis, sputum production and shortness of breath.   Cardiovascular: Positive for orthopnea and leg swelling. Negative for chest pain and palpitations.  Neurological: Positive for weakness and headaches.   Objective:  BP 134/74 mmHg  Pulse 64  Temp(Src) 98.2 F (36.8 C) (Oral)  Resp 16  Wt 275 lb (124.739 kg)  Physical Exam  Constitutional: He  is oriented to person, place, and time and well-developed, well-nourished, and in no distress.  HENT:  Head: Normocephalic and atraumatic.  Right Ear: External ear normal.  Left Ear: External ear normal.  Nose: Nose normal.  Mouth/Throat: Oropharynx is clear and moist.  Eyes: Conjunctivae are normal.  Cardiovascular: Normal rate, regular rhythm and normal heart sounds.   Pulmonary/Chest: Effort normal and breath sounds normal.  Abdominal: Soft.  Neurological: He is alert and oriented to person, place, and time. Gait normal.  Skin: Skin is warm and dry.  Psychiatric: Mood, memory, affect and judgment normal.    Assessment and Plan :   Acute sinusitis, recurrence not specified, unspecified location I have done the exam and reviewed the above chart and it is accurate to the best of my knowledge.  Patrick Aschoff MD [facility] 02/20/2015 2:55 PM

## 2015-04-26 ENCOUNTER — Telehealth: Payer: Self-pay | Admitting: Family Medicine

## 2015-04-26 ENCOUNTER — Ambulatory Visit: Payer: PPO | Admitting: Family Medicine

## 2015-04-26 ENCOUNTER — Other Ambulatory Visit: Payer: Self-pay | Admitting: Family Medicine

## 2015-04-26 NOTE — Telephone Encounter (Signed)
Pt wife called back to scheduled an appointment for tomorrow at 3:15 with Dr Wilmon Arms

## 2015-04-26 NOTE — Telephone Encounter (Signed)
Pt wife states pt has a cough and congestion that started yesterday.   Pt wife states pt does not want to come in to the office.  Pt is requesting a Rx called in to help with this.  [employer].  [id number]

## 2015-04-26 NOTE — Telephone Encounter (Signed)
Please review-aa 

## 2015-04-27 ENCOUNTER — Encounter: Payer: Self-pay | Admitting: Family Medicine

## 2015-04-27 ENCOUNTER — Ambulatory Visit (INDEPENDENT_AMBULATORY_CARE_PROVIDER_SITE_OTHER): Payer: PPO | Admitting: Family Medicine

## 2015-04-27 VITALS — BP 138/70 | HR 78 | Temp 99.9°F | Resp 18 | Wt 279.0 lb

## 2015-04-27 DIAGNOSIS — J42 Unspecified chronic bronchitis: Secondary | ICD-10-CM

## 2015-04-27 DIAGNOSIS — R52 Pain, unspecified: Secondary | ICD-10-CM | POA: Diagnosis not present

## 2015-04-27 DIAGNOSIS — J019 Acute sinusitis, unspecified: Secondary | ICD-10-CM

## 2015-04-27 LAB — POC INFLUENZA A&B (BINAX/QUICKVUE)
Influenza A, POC: NEGATIVE
Influenza B, POC: NEGATIVE

## 2015-04-27 MED ORDER — HYDROCODONE-HOMATROPINE 5-1.5 MG/5ML PO SYRP: 5.0000 mL | ORAL_SOLUTION | Freq: Three times a day (TID) | ORAL | Status: DC | PRN

## 2015-04-27 MED ORDER — BENZONATATE 200 MG PO CAPS: 200.0000 mg | ORAL_CAPSULE | Freq: Three times a day (TID) | ORAL | Status: DC | PRN

## 2015-04-27 MED ORDER — DOXYCYCLINE HYCLATE 100 MG PO TABS
100.0000 mg | ORAL_TABLET | Freq: Two times a day (BID) | ORAL | Status: DC
Start: 2015-04-27 — End: 2015-05-10

## 2015-04-27 NOTE — Progress Notes (Signed)
Patient ID: Patrick Thornton, male   DOB: 01/12/1941, 75 y.o.   MRN: [id number]    Subjective:  HPI Pt reports that he started feeling bad on Monday (4 days ago) and yesterday and today he feels worse. He has a productive cough with a pink tint to the sputum, body aches, chills, sweats, sore throat, nausea, hoarseness, watery, painful/burning eyes, shortness of breath and muscle pain from coughing so much. He has felt like he has had a fever but has not checked it. He reports that he id going to the [employer] and wants to be feeling better. He does not have any nasal congestion, or ear pain. He would like a refill on his hycodan and his daughter told him about a coughing pill that would help him during the day with the cough.   Prior to Admission medications   Medication Sig Start Date End Date Taking? Authorizing Provider  Ascorbic Acid (VITAMIN C) 100 MG tablet Take by mouth. 07/30/11  Yes Historical Provider, MD  aspirin 81 MG tablet Take by mouth. 07/30/11  Yes Historical Provider, MD  Cholecalciferol (VITAMIN D-3 PO) Take 1 tablet by mouth daily.   Yes Historical Provider, MD  cyanocobalamin 1000 MCG tablet Take by mouth. 07/30/11  Yes Historical Provider, MD  doxazosin (CARDURA) 8 MG tablet  03/17/13  Yes Historical Provider, MD  furosemide (LASIX) 20 MG tablet  03/04/13  Yes Historical Provider, MD  HYDROcodone-homatropine (HYCODAN) 5-1.5 MG/5ML syrup Take 5 mLs by mouth every 8 (eight) hours as needed for cough. 02/20/15  Yes Richard Maceo Pro., MD  ibuprofen (ADVIL,MOTRIN) 800 MG tablet every 8 (eight) hours as needed.  01/23/13  Yes Historical Provider, MD  Multiple Vitamin tablet Take by mouth. 07/30/11  Yes Historical Provider, MD  polyethylene glycol powder (GLYCOLAX/MIRALAX) powder 255 grams one bottle for colonoscopy prep 03/24/13  Yes Robert Bellow, MD  quinapril (ACCUPRIL) 20 MG tablet TAKE 1 TABLET BY MOUTH ONCE A DAY 01/20/15  Yes Jerrol Banana., MD  temazepam (RESTORIL)  30 MG capsule at bedtime as needed.  03/22/13  Yes Historical Provider, MD  vitamin E 200 UNIT capsule Take by mouth. 07/30/11  Yes Historical Provider, MD    Patient Active Problem List   Diagnosis Date Noted  . Benign fibroma of prostate 11/01/2014  . ED (erectile dysfunction) of organic origin 11/01/2014  . Acid reflux 11/01/2014  . HLD (hyperlipidemia) 11/01/2014  . BP (high blood pressure) 11/01/2014  . Adiposity 11/01/2014  . Arthritis, degenerative 11/01/2014  . Cannot sleep 11/01/2014  . MI (mitral incompetence) 11/29/2013  . Personal history of colonic polyps 03/25/2013    Past Medical History  Diagnosis Date  . Arthritis   . Hypertension   . Colon polyp 2003    Social History   Social History  . Marital Status: Married    Spouse Name: N/A  . Number of Children: N/A  . Years of Education: N/A   Occupational History  . Not on file.   Social History Main Topics  . Smoking status: Former Smoker -- 40 years  . Smokeless tobacco: Never Used  . Alcohol Use: No  . Drug Use: No  . Sexual Activity: Not on file   Other Topics Concern  . Not on file   Social History Narrative    No Known Allergies  Review of Systems  Constitutional: Positive for chills, malaise/fatigue and diaphoresis.  HENT: Positive for congestion and sore throat.   Eyes: Positive for  pain, discharge and redness.  Respiratory: Positive for cough, sputum production and shortness of breath.   Cardiovascular: Negative.   Gastrointestinal: Positive for nausea.  Genitourinary: Negative.   Musculoskeletal: Positive for myalgias.  Skin: Negative.   Neurological: Positive for weakness.  Endo/Heme/Allergies: Negative.   Psychiatric/Behavioral: Negative.     Immunization History  Administered Date(s) Administered  . Influenza, High Dose Seasonal PF 11/01/2014  . Influenza-Unspecified 10/21/2013  . Pneumococcal Conjugate-13 10/27/2013  . Pneumococcal Polysaccharide-23 07/30/2011   Objective:    BP 138/70 mmHg  Pulse 78  Temp(Src) 99.9 F (37.7 C) (Oral)  Resp 18  Wt 279 lb (126.554 kg)  SpO2 97%  Physical Exam  Constitutional: He is well-developed, well-nourished, and in no distress.  HENT:  Head: Normocephalic and atraumatic.  Right Ear: External ear normal.  Left Ear: External ear normal.  Nose: Nose normal.  Mouth/Throat: Oropharynx is clear and moist.  Eyes: Conjunctivae and EOM are normal. Pupils are equal, round, and reactive to light.  Neck: Normal range of motion. Neck supple.  Cardiovascular: Normal rate, regular rhythm, normal heart sounds and intact distal pulses.   Pulmonary/Chest: Effort normal and breath sounds normal.  Abdominal: Soft.  Neurological: He is alert. He has normal reflexes. Gait normal. GCS score is 15.  Skin: Skin is warm and dry.  Psychiatric: Mood, memory, affect and judgment normal.    Lab Results  Component Value Date   WBC 6.9 04/26/2014   HGB 14.3 04/26/2014   HCT 42 04/26/2014   PLT 243 04/26/2014   CHOL 142 04/26/2014   TRIG 51 04/26/2014   HDL 38 04/26/2014   LDLCALC 94 04/26/2014   TSH 5.13 04/26/2014    CMP     Component Value Date/Time   NA 142 04/26/2014   K 4.3 04/26/2014   BUN 11 04/26/2014   CREATININE 1.0 04/26/2014   AST 19 04/26/2014   ALT 16 04/26/2014   ALKPHOS 86 04/26/2014    Assessment and Plan :  1. Body aches  - POC Influenza A&B(BINAX/QUICKVUE)-- Negative.  2. Acute sinusitis, recurrence not specified, unspecified location   3. Chronic bronchitis, unspecified chronic bronchitis type ([place])  - HYDROcodone-homatropine (HYCODAN) 5-1.5 MG/5ML syrup; Take 5 mLs by mouth every 8 (eight) hours as needed for cough.  Dispense: 120 mL; Refill: 0 - benzonatate (TESSALON) 200 MG capsule; Take 1 capsule (200 mg total) by mouth 3 (three) times daily as needed for cough.  Dispense: 20 capsule; Refill: 0 - doxycycline (VIBRA-TABS) 100 MG tablet; Take 1 tablet (100 mg total) by mouth 2 (two) times daily.   Dispense: 20 tablet; Refill: 0  I have done the exam and reviewed the above chart and it is accurate to the best of my knowledge.  Patient was seen and examined by Dr. Miguel Aschoff, and noted scribed by Webb Laws, [place] MD [facility] 04/27/2015 10:15 AM

## 2015-04-30 ENCOUNTER — Emergency Department
Admission: EM | Admit: 2015-04-30 | Discharge: 2015-04-30 | Disposition: A | Discharge status: Home / Self Care | Payer: PPO | Attending: Emergency Medicine | Admitting: Emergency Medicine

## 2015-04-30 ENCOUNTER — Emergency Department: Payer: PPO

## 2015-04-30 ENCOUNTER — Encounter: Payer: Self-pay | Admitting: *Deleted

## 2015-04-30 ENCOUNTER — Other Ambulatory Visit: Payer: Self-pay

## 2015-04-30 DIAGNOSIS — I10 Essential (primary) hypertension: Secondary | ICD-10-CM | POA: Diagnosis not present

## 2015-04-30 DIAGNOSIS — Z9049 Acquired absence of other specified parts of digestive tract: Secondary | ICD-10-CM | POA: Insufficient documentation

## 2015-04-30 DIAGNOSIS — J0100 Acute maxillary sinusitis, unspecified: Secondary | ICD-10-CM | POA: Insufficient documentation

## 2015-04-30 DIAGNOSIS — Z86010 Personal history of colonic polyps: Secondary | ICD-10-CM | POA: Insufficient documentation

## 2015-04-30 DIAGNOSIS — R05 Cough: Secondary | ICD-10-CM | POA: Diagnosis not present

## 2015-04-30 DIAGNOSIS — Z79899 Other long term (current) drug therapy: Secondary | ICD-10-CM | POA: Insufficient documentation

## 2015-04-30 DIAGNOSIS — R0602 Shortness of breath: Secondary | ICD-10-CM | POA: Insufficient documentation

## 2015-04-30 DIAGNOSIS — Z7982 Long term (current) use of aspirin: Secondary | ICD-10-CM | POA: Diagnosis not present

## 2015-04-30 DIAGNOSIS — J069 Acute upper respiratory infection, unspecified: Secondary | ICD-10-CM | POA: Insufficient documentation

## 2015-04-30 DIAGNOSIS — M1990 Unspecified osteoarthritis, unspecified site: Secondary | ICD-10-CM | POA: Insufficient documentation

## 2015-04-30 DIAGNOSIS — Z87891 Personal history of nicotine dependence: Secondary | ICD-10-CM | POA: Diagnosis not present

## 2015-04-30 DIAGNOSIS — E785 Hyperlipidemia, unspecified: Secondary | ICD-10-CM | POA: Diagnosis not present

## 2015-04-30 LAB — BASIC METABOLIC PANEL
Anion gap: 4 — ABNORMAL LOW (ref 5–15)
BUN: 13 mg/dL (ref 6–20)
CALCIUM: 8.2 mg/dL — AB (ref 8.9–10.3)
CO2: 26 mmol/L (ref 22–32)
CREATININE: 0.85 mg/dL (ref 0.61–1.24)
Chloride: 106 mmol/L (ref 101–111)
GFR calc non Af Amer: >60 mL/min (ref >60)
GLUCOSE: 110 mg/dL — AB (ref 65–99)
Potassium: 3.8 mmol/L (ref 3.5–5.1)
Sodium: 136 mmol/L (ref 135–145)

## 2015-04-30 LAB — CBC
HEMATOCRIT: 38.7 % — AB (ref 40.0–52.0)
Hemoglobin: 13.1 g/dL (ref 13.0–18.0)
MCH: 32 pg (ref 26.0–34.0)
MCHC: 34 g/dL (ref 32.0–36.0)
MCV: 94.2 fL (ref 80.0–100.0)
Platelets: 188 10*3/uL (ref 150–440)
RBC: 4.11 MIL/uL — ABNORMAL LOW (ref 4.40–5.90)
RDW: 13.8 % (ref 11.5–14.5)
WBC: 3.8 10*3/uL (ref 3.8–10.6)

## 2015-04-30 LAB — RAPID INFLUENZA A&B ANTIGENS
Influenza A (ARMC): NEGATIVE
Influenza B (ARMC): NEGATIVE

## 2015-04-30 LAB — BRAIN NATRIURETIC PEPTIDE: B Natriuretic Peptide: 245 pg/mL — ABNORMAL HIGH (ref 0.0–100.0)

## 2015-04-30 MED ORDER — PREDNISONE 20 MG PO TABS: 60.0000 mg | ORAL_TABLET | Freq: Every day | ORAL | Status: DC

## 2015-04-30 MED ORDER — IPRATROPIUM-ALBUTEROL 0.5-2.5 (3) MG/3ML IN SOLN
3.0000 mL | Freq: Once | RESPIRATORY_TRACT | Status: AC
  Administered 2015-04-30: 3 mL via RESPIRATORY_TRACT
  Filled 2015-04-30: qty 3

## 2015-04-30 MED ORDER — ALBUTEROL SULFATE HFA 108 (90 BASE) MCG/ACT IN AERS: 2.0000 | INHALATION_SPRAY | Freq: Four times a day (QID) | RESPIRATORY_TRACT | Status: DC | PRN

## 2015-04-30 MED ORDER — AMOXICILLIN-POT CLAVULANATE 875-125 MG PO TABS
1.0000 | ORAL_TABLET | Freq: Two times a day (BID) | ORAL | Status: AC
Start: 2015-04-30 — End: 2015-05-10

## 2015-04-30 NOTE — ED Notes (Signed)
Pt alert and oriented X4, active, cooperative, pt in NAD. RR even and unlabored, color WNL.  Pt informed to return if any life threatening symptoms occur.   

## 2015-04-30 NOTE — Discharge Instructions (Signed)
Please stop taking doxycycline. Take the entire course of Augmentin, even if you are feeling better.  Please make an appointment for follow-up with your primary care physician in 2-3 days.  Please return to the emergency department if you develop shortness of breath, chest pain, lightheadedness or fainting, or any other symptoms concerning to you.

## 2015-04-30 NOTE — ED Notes (Signed)
Patient ambulated around nursing station without difficulty. Patient maintained O2 sat of 92-96% during ambulation. Patient reports some worsening of shortness of breath with ambulation.

## 2015-04-30 NOTE — ED Notes (Signed)
Patient states that he has been having shortness of breath since Friday. Patient was seen by his PCP on Thursday for nasal congestion, he was tested for flu and that came back negative. Patient was given antibiotic and cough med.  Patient states that he is more short of breath when walking

## 2015-04-30 NOTE — ED Provider Notes (Signed)
[facility] Emergency Department Provider Note  ____________________________________________  Time seen: Approximately 10:42 AM  I have reviewed the triage vital signs and the nursing notes.   HISTORY  Chief Complaint Nasal Congestion and Shortness of Breath    HPI Patrick Thornton is a 75 y.o. male with a history of HTN presenting with shortness of breath. The patient reports that for the past 4-5 days he has been having a cough productive of thin occasionally blood-tinged sputum associated with frontal sinus pressure, congestion and rhinorrhea, bilateral eye "burning" without discharge. For the past day he has been having progressively worsening exertional shortness of breath with decreased exercise tolerance. He is not having any chest pain, palpitations, lightheadedness or syncope. He did have fever to 102 several days ago. No nausea vomiting or diarrhea, no worsening leg swelling. The patient was placed on doxycycline by his PMD on 4/6, but his symptoms have not improved.   Past Medical History  Diagnosis Date  . Arthritis   . Hypertension   . Colon polyp 2003    Patient Active Problem List   Diagnosis Date Noted  . Benign fibroma of prostate 11/01/2014  . ED (erectile dysfunction) of organic origin 11/01/2014  . Acid reflux 11/01/2014  . HLD (hyperlipidemia) 11/01/2014  . BP (high blood pressure) 11/01/2014  . Adiposity 11/01/2014  . Arthritis, degenerative 11/01/2014  . Cannot sleep 11/01/2014  . MI (mitral incompetence) 11/29/2013  . Personal history of colonic polyps 03/25/2013    Past Surgical History  Procedure Laterality Date  . Hernia repair    . Cholecystectomy    . Colonoscopy  03-22-08    Dr Bary Castilla  . Other surgical history      transanal excision of a villous adenoma  . Fracture surgery      Current Outpatient Rx  Name  Route  Sig  Dispense  Refill  . Ascorbic Acid (VITAMIN C) 100 MG tablet   Oral   Take 100 mg by mouth  daily.          Marland Kitchen aspirin 81 MG tablet   Oral   Take 81 mg by mouth daily.          . benzonatate (TESSALON) 200 MG capsule   Oral   Take 1 capsule (200 mg total) by mouth 3 (three) times daily as needed for cough.   20 capsule   0   . Cholecalciferol (VITAMIN D-3 PO)   Oral   Take 1 tablet by mouth daily.         . cyanocobalamin 1000 MCG tablet   Oral   Take 1,000 mcg by mouth daily.          Marland Kitchen doxazosin (CARDURA) 8 MG tablet   Oral   Take 8 mg by mouth daily.          Marland Kitchen doxycycline (VIBRA-TABS) 100 MG tablet   Oral   Take 1 tablet (100 mg total) by mouth 2 (two) times daily.   20 tablet   0   . furosemide (LASIX) 20 MG tablet   Oral   Take 20 mg by mouth daily.          Marland Kitchen HYDROcodone-homatropine (HYCODAN) 5-1.5 MG/5ML syrup   Oral   Take 5 mLs by mouth every 8 (eight) hours as needed for cough.   120 mL   0   . ibuprofen (ADVIL,MOTRIN) 800 MG tablet   Oral   Take 800 mg by mouth every 8 (eight) hours  as needed.          . Multiple Vitamin tablet   Oral   Take 1 tablet by mouth daily.          . quinapril (ACCUPRIL) 20 MG tablet      TAKE 1 TABLET BY MOUTH ONCE A DAY   30 tablet   5   . temazepam (RESTORIL) 30 MG capsule   Oral   Take 30 mg by mouth at bedtime as needed.          . vitamin E 200 UNIT capsule   Oral   Take 400 Units by mouth.          Marland Kitchen albuterol (PROVENTIL HFA;VENTOLIN HFA) 108 (90 Base) MCG/ACT inhaler   Inhalation   Inhale 2 puffs into the lungs every 6 (six) hours as needed for wheezing or shortness of breath.   1 Inhaler   0   . amoxicillin-clavulanate (AUGMENTIN) 875-125 MG tablet   Oral   Take 1 tablet by mouth every 12 (twelve) hours.   28 tablet   0   . predniSONE (DELTASONE) 20 MG tablet   Oral   Take 3 tablets (60 mg total) by mouth daily.   15 tablet   0     Allergies Review of patient's allergies indicates no known allergies.  Family History  Problem Relation Age of Onset  .  Stroke Mother   . Cancer Father     melanoma  . Diabetes Father   . Hypertension Father   . Heart attack Father   . Hyperlipidemia Brother   . Diabetes Son   . COPD Son   . Obesity Son   . Heart attack Son   . CAD Brother     Social History Social History  Substance Use Topics  . Smoking status: Former Smoker -- 40 years  . Smokeless tobacco: Never Used  . Alcohol Use: No    Review of Systems Constitutional: Positive fever, no chills.  No lightheadedness or syncope.  Positive decreased exercise tolerance due to sob. Eyes: No visual changes. Positive lacrimation and burning sensation in the eyes bilaterally. ENT: No sore throat. Positive congestion and rhinorrhea.  Positive frontal sinus pressure. Cardiovascular: Denies chest pain. Denies palpitations. Respiratory: Positive shortness of breath.  Positive cough. Gastrointestinal: No abdominal pain.  No nausea, no vomiting.  No diarrhea.  No constipation. Genitourinary: Negative for dysuria. Musculoskeletal: Negative for back pain. Skin: Negative for rash. Neurological: Negative for headaches. No focal numbness, tingling or weakness.   10-point ROS otherwise negative.  ____________________________________________   PHYSICAL EXAM:  VITAL SIGNS: ED Triage Vitals  Enc Vitals Group     BP 04/30/15 1005 144/53 mmHg     Pulse Rate 04/30/15 1005 87     Resp 04/30/15 1005 18     Temp 04/30/15 1005 98.3 F (36.8 C)     Temp Source 04/30/15 1005 Oral     SpO2 04/30/15 1005 94 %     Weight 04/30/15 1005 275 lb (124.739 kg)     Height 04/30/15 1005 5\' 11"  (1.803 m)     Head Cir --      Peak Flow --      Pain Score --      Pain Loc --      Pain Edu? --      Excl. in [place]? --     Constitutional: Alert and oriented. Mildly uncomfortable appearing but in no acute distress. Answers questions appropriately. Eyes: Conjunctivae  are normal.  EOMI. No scleral icterus.No eye discharge. Positive bilateral conjunctival  erythema. Head: Atraumatic. Positive tenderness over the maxillary and frontal sinuses. Nose: Positive congestion/rhinnorhea. Mouth/Throat: Mucous membranes are moist.  Neck: No stridor.  Supple.  No JVD. Cardiovascular: Normal rate, regular rhythm. No murmurs, rubs or gallops.  Respiratory: Mild tachypnea.  No accessory muscle use or retractions. Lungs CTAB.  No wheezes, rales or ronchi. Gastrointestinal: Soft, nontender and nondistended.  No guarding or rebound.  No peritoneal signs. Musculoskeletal: Positive symmetric bilateral lower extremity edema to the mid tibial shaft. No ttp in the calves or palpable cords.  Negative Homan's sign. Neurologic:  A&Ox3.  Speech is clear.  Face and smile are symmetric.  EOMI.  Moves all extremities well. Skin:  Skin is warm, dry and intact. No rash noted. Psychiatric: Mood and affect are normal. Speech and behavior are normal.  Normal judgement.  ____________________________________________   LABS (all labs ordered are listed, but only abnormal results are displayed)  Labs Reviewed  CBC - Abnormal; Notable for the following:    RBC 4.11 (*)    HCT 38.7 (*)    All other components within normal limits  BASIC METABOLIC PANEL - Abnormal; Notable for the following:    Glucose, Bld 110 (*)    Calcium 8.2 (*)    Anion gap 4 (*)    All other components within normal limits  RAPID INFLUENZA A&B ANTIGENS ([facility] ONLY)  BRAIN NATRIURETIC PEPTIDE   ____________________________________________  EKG  ED ECG REPORT I, Eula Listen, the attending physician, personally viewed and interpreted this ECG.   Date: 04/30/2015  EKG Time: 1002  Rate: 89  Rhythm: normal sinus rhythm  Axis: Leftward  Intervals:none  ST&T Change: Nonspecific T-wave inversions in V1. No ST elevation.  ____________________________________________  RADIOLOGY  Dg Chest 2 View  04/30/2015  CLINICAL DATA:  Pt to ED c/o productive cough, shortness of breath worsened with  exertion x 1 week, states cough and difficulty breathing are worse at night and when laying down, h/o htn, no other known heart/lung conditions, denies ever being dx with pneumonia EXAM: CHEST  2 VIEW COMPARISON:  None. FINDINGS: Cardiac silhouette is normal in size. No mediastinal or hilar masses or evidence of adenopathy. There is prominence of the bronchovascular markings. No lung consolidation to suggest pneumonia. No pulmonary edema. No pleural effusion or pneumothorax. The bony thorax is demineralized but grossly intact. IMPRESSION: No acute cardiopulmonary disease. Electronically Signed   By: Lajean Manes M.D.   On: 04/30/2015 11:11    ____________________________________________   PROCEDURES  Procedure(s) performed: None  Critical Care performed: No ____________________________________________   INITIAL IMPRESSION / ASSESSMENT AND PLAN / ED COURSE  Pertinent labs & imaging results that were available during my care of the patient were reviewed by me and considered in my medical decision making (see chart for details).  75 y.o. male with 4-5 days of fever, cough, frontal and maxillary sinus pressure, congestion and rhinorrhea presenting for shortness of breath with exertion and decreased exercise tolerance. The patient is not having any chest pain and his symptoms are most consistent with a viral URI or sinusitis.  I'll get a chest x-ray to rule out pneumonia. We also evaluate for influenza. In the meantime, I'll treat the patient symptomatically and reevaluate him after his evaluation is complete.  ----------------------------------------- 12:29 PM on 04/30/2015 -----------------------------------------  The patient is feeling better after DuoNeb. He was ambulated and maintained oxygen saturations of 92% and greater throughout the entirety  of his long walk. His chest x-ray does not show pneumonia. I will plan to send him home with Augmentin to treat sinusitis. I've asked him to  discontinue his doxycycline. He will follow-up with his primary care physician on Tuesday.  ____________________________________________  FINAL CLINICAL IMPRESSION(S) / ED DIAGNOSES  Final diagnoses:  Exertional shortness of breath  URI (upper respiratory infection)  Acute maxillary sinusitis, recurrence not specified      NEW MEDICATIONS STARTED DURING THIS VISIT:  New Prescriptions   ALBUTEROL (PROVENTIL HFA;VENTOLIN HFA) 108 (90 BASE) MCG/ACT INHALER    Inhale 2 puffs into the lungs every 6 (six) hours as needed for wheezing or shortness of breath.   AMOXICILLIN-CLAVULANATE (AUGMENTIN) 875-125 MG TABLET    Take 1 tablet by mouth every 12 (twelve) hours.   PREDNISONE (DELTASONE) 20 MG TABLET    Take 3 tablets (60 mg total) by mouth daily.     Eula Listen, MD 04/30/15 1322

## 2015-04-30 NOTE — ED Notes (Signed)
States cough, congestion, and SOB since last week, states he was negative for the flu at PCP, states pink tinged productive cough

## 2015-04-30 NOTE — ED Notes (Signed)
Report given to Ally, RN 

## 2015-04-30 NOTE — ED Notes (Signed)
Pt finished with breathing tx.

## 2015-05-02 ENCOUNTER — Ambulatory Visit: Payer: PPO | Admitting: Family Medicine

## 2015-05-10 ENCOUNTER — Other Ambulatory Visit: Payer: Self-pay | Admitting: Family Medicine

## 2015-05-10 ENCOUNTER — Encounter: Payer: Self-pay | Admitting: Family Medicine

## 2015-05-10 ENCOUNTER — Ambulatory Visit (INDEPENDENT_AMBULATORY_CARE_PROVIDER_SITE_OTHER): Payer: PPO | Admitting: Family Medicine

## 2015-05-10 VITALS — BP 128/68 | Temp 98.5°F | Resp 20 | Ht 71.0 in | Wt 272.0 lb

## 2015-05-10 DIAGNOSIS — J320 Chronic maxillary sinusitis: Secondary | ICD-10-CM | POA: Diagnosis not present

## 2015-05-10 DIAGNOSIS — J42 Unspecified chronic bronchitis: Secondary | ICD-10-CM

## 2015-05-10 MED ORDER — HYDROCODONE-HOMATROPINE 5-1.5 MG/5ML PO SYRP: 5.0000 mL | ORAL_SOLUTION | Freq: Three times a day (TID) | ORAL | Status: DC | PRN

## 2015-05-10 MED ORDER — CLARITHROMYCIN 500 MG PO TABS: 500.0000 mg | ORAL_TABLET | Freq: Two times a day (BID) | ORAL | Status: DC

## 2015-05-10 NOTE — Progress Notes (Signed)
Patient ID: JALYNN HUTZELL, male   DOB: 1940-04-28, 75 y.o.   MRN: [id number]       Patient: Patrick Thornton Male    DOB: 1940/04/28   75 y.o.   MRN: [id number] Visit Date: 05/10/2015  Today's Provider: [name], MD   Chief Complaint  Patient presents with  . Hypertension    6 month follow up   . Sinusitis   Subjective:    Hypertension The problem is controlled. Associated symptoms include shortness of breath. Pertinent negatives include no blurred vision, chest pain or headaches. (Patient has shortness of breath due to cough) There are no compliance problems.   Sinusitis There has been no fever. Associated symptoms include congestion, coughing, a hoarse voice, shortness of breath and sinus pressure. Pertinent negatives include no headaches. Past treatments include antibiotics.   Patient reports that he is still on antibiotics (Augmentin) for URI. He reports that he has shortness of breath with exertion and he still has a cough.     No Known Allergies Previous Medications   ALBUTEROL (PROVENTIL HFA;VENTOLIN HFA) 108 (90 BASE) MCG/ACT INHALER    Inhale 2 puffs into the lungs every 6 (six) hours as needed for wheezing or shortness of breath.   AMOXICILLIN-CLAVULANATE (AUGMENTIN) 875-125 MG TABLET    Take 1 tablet by mouth every 12 (twelve) hours.   ASCORBIC ACID (VITAMIN C) 100 MG TABLET    Take 100 mg by mouth daily.    ASPIRIN 81 MG TABLET    Take 81 mg by mouth daily.    BENZONATATE (TESSALON) 200 MG CAPSULE    Take 1 capsule (200 mg total) by mouth 3 (three) times daily as needed for cough.   CHOLECALCIFEROL (VITAMIN D-3 PO)    Take 1 tablet by mouth daily.   CYANOCOBALAMIN 1000 MCG TABLET    Take 1,000 mcg by mouth daily.    DOXAZOSIN (CARDURA) 8 MG TABLET    Take 8 mg by mouth daily.    FUROSEMIDE (LASIX) 20 MG TABLET    Take 20 mg by mouth daily.    HYDROCODONE-HOMATROPINE (HYCODAN) 5-1.5 MG/5ML SYRUP    Take 5 mLs by mouth every 8 (eight) hours as needed for cough.     IBUPROFEN (ADVIL,MOTRIN) 800 MG TABLET    Take 800 mg by mouth every 8 (eight) hours as needed.    MULTIPLE VITAMIN TABLET    Take 1 tablet by mouth daily.    PREDNISONE (DELTASONE) 20 MG TABLET    Take 3 tablets (60 mg total) by mouth daily.   QUINAPRIL (ACCUPRIL) 20 MG TABLET    TAKE 1 TABLET BY MOUTH ONCE A DAY   TEMAZEPAM (RESTORIL) 30 MG CAPSULE    Take 30 mg by mouth at bedtime as needed.    VITAMIN E 200 UNIT CAPSULE    Take 400 Units by mouth.     Review of Systems  HENT: Positive for congestion, hoarse voice and sinus pressure.   Eyes: Negative for blurred vision.  Respiratory: Positive for cough and shortness of breath.   Cardiovascular: Negative for chest pain.  Neurological: Negative for headaches.    Social History  Substance Use Topics  . Smoking status: Former Smoker -- 40 years  . Smokeless tobacco: Never Used  . Alcohol Use: No   Objective:   BP 128/68 mmHg  Temp(Src) 98.5 F (36.9 C)  Resp 20  Ht 5\' 11"  (1.803 m)  Wt 272 lb (123.378 kg)  BMI 37.95 kg/m2  Physical Exam  Constitutional: He appears well-developed and well-nourished.  HENT:  Head: Normocephalic and atraumatic.  Right Ear: External ear normal.  Left Ear: External ear normal.  Nose: Nose normal.  Mouth/Throat: Oropharynx is clear and moist.  Maxillary sinuses tender.   Eyes: Conjunctivae and EOM are normal. Pupils are equal, round, and reactive to light.  Neck: Normal range of motion. Neck supple.  Cardiovascular: Normal rate, regular rhythm and normal heart sounds.   Pulmonary/Chest: Effort normal and breath sounds normal.        Assessment & Plan:     1. Chronic bronchitis, unspecified chronic bronchitis type ([place])  - HYDROcodone-homatropine (HYCODAN) 5-1.5 MG/5ML syrup; Take 5 mLs by mouth every 8 (eight) hours as needed for cough.  Dispense: 120 mL; Refill: 0  2. Maxillary sinusitis, unspecified chronicity/chronic  - clarithromycin (BIAXIN) 500 MG tablet; Take 1 tablet (500  mg total) by mouth 2 (two) times daily.  Dispense: 20 tablet; Refill: 0 I have done the exam and reviewed the above chart and it is accurate to the best of my knowledge.       Richard Cranford Mon, MD  [facility]

## 2015-05-22 ENCOUNTER — Other Ambulatory Visit: Payer: Self-pay | Admitting: Family Medicine

## 2015-05-22 DIAGNOSIS — K219 Gastro-esophageal reflux disease without esophagitis: Secondary | ICD-10-CM | POA: Diagnosis not present

## 2015-05-22 DIAGNOSIS — R0600 Dyspnea, unspecified: Secondary | ICD-10-CM | POA: Diagnosis not present

## 2015-05-22 DIAGNOSIS — R002 Palpitations: Secondary | ICD-10-CM | POA: Diagnosis not present

## 2015-05-22 DIAGNOSIS — I340 Nonrheumatic mitral (valve) insufficiency: Secondary | ICD-10-CM | POA: Diagnosis not present

## 2015-05-22 DIAGNOSIS — R9431 Abnormal electrocardiogram [ECG] [EKG]: Secondary | ICD-10-CM | POA: Diagnosis not present

## 2015-05-22 DIAGNOSIS — I10 Essential (primary) hypertension: Secondary | ICD-10-CM | POA: Diagnosis not present

## 2015-06-06 DIAGNOSIS — R0600 Dyspnea, unspecified: Secondary | ICD-10-CM | POA: Diagnosis not present

## 2015-06-06 DIAGNOSIS — I10 Essential (primary) hypertension: Secondary | ICD-10-CM | POA: Diagnosis not present

## 2015-06-06 DIAGNOSIS — I340 Nonrheumatic mitral (valve) insufficiency: Secondary | ICD-10-CM | POA: Diagnosis not present

## 2015-06-06 DIAGNOSIS — R600 Localized edema: Secondary | ICD-10-CM | POA: Diagnosis not present

## 2015-06-06 DIAGNOSIS — E782 Mixed hyperlipidemia: Secondary | ICD-10-CM | POA: Diagnosis not present

## 2015-06-06 DIAGNOSIS — I071 Rheumatic tricuspid insufficiency: Secondary | ICD-10-CM | POA: Diagnosis not present

## 2015-06-06 DIAGNOSIS — I272 Other secondary pulmonary hypertension: Secondary | ICD-10-CM | POA: Diagnosis not present

## 2015-07-07 ENCOUNTER — Other Ambulatory Visit: Payer: Self-pay | Admitting: Family Medicine

## 2015-07-07 ENCOUNTER — Telehealth: Payer: Self-pay

## 2015-07-07 NOTE — Telephone Encounter (Signed)
Please call wife and let her know im on the phone-aa

## 2015-07-07 NOTE — Telephone Encounter (Signed)
Wife called and states patient pulled his back yesterday by twisting wrong and wanted to see if he could get a muscle relaxer or can he take Gabapentin for this? I advised wife that he may need to be seen but would check with provider. Please call wife back and let her know=-aa

## 2015-07-07 NOTE — Telephone Encounter (Signed)
Advised wife as below.  

## 2015-07-07 NOTE — Telephone Encounter (Signed)
May use Percogesic (OTC) with Ibuprofen 800 mg TID with food. Apply moist heat. If no better in the morning, I will be in the office between 9 am - 12 pm.

## 2015-07-10 ENCOUNTER — Ambulatory Visit (INDEPENDENT_AMBULATORY_CARE_PROVIDER_SITE_OTHER): Payer: PPO | Admitting: Family Medicine

## 2015-07-10 VITALS — BP 132/96 | HR 58 | Temp 98.4°F | Resp 16

## 2015-07-10 DIAGNOSIS — M158 Other polyosteoarthritis: Secondary | ICD-10-CM

## 2015-07-10 DIAGNOSIS — S39012A Strain of muscle, fascia and tendon of lower back, initial encounter: Secondary | ICD-10-CM

## 2015-07-10 MED ORDER — PREDNISONE 10 MG (21) PO TBPK: 10.0000 mg | ORAL_TABLET | Freq: Every day | ORAL | Status: DC

## 2015-07-10 MED ORDER — CYCLOBENZAPRINE HCL 10 MG PO TABS: 10.0000 mg | ORAL_TABLET | Freq: Three times a day (TID) | ORAL | Status: DC | PRN

## 2015-07-10 MED ORDER — HYDROCODONE-ACETAMINOPHEN 5-325 MG PO TABS: 1.0000 | ORAL_TABLET | ORAL | Status: DC | PRN

## 2015-07-10 NOTE — Progress Notes (Signed)
Patient ID: Patrick Thornton, male   DOB: 04/06/1940, 75 y.o.   MRN: [id number]    Subjective:  HPI  Patient has had lower mid to the left back pain for 7 days now. Pain is constant with movement, when he tries to rest in a chair for example has times of not been able to get comfortable. Pain is radiating down to the left leg. Patient states he has history of DDD. Patient does not recall any injury to the area just remember getting up from a stool and started to hurt after that. He has taking IBuprofen 800 mg twice daily and applied heat with no relief.  Prior to Admission medications   Medication Sig Start Date End Date Taking? Authorizing Provider  albuterol (PROVENTIL HFA;VENTOLIN HFA) 108 (90 Base) MCG/ACT inhaler Inhale 2 puffs into the lungs every 6 (six) hours as needed for wheezing or shortness of breath. 04/30/15  Yes Anne-Caroline Mariea Clonts, MD  Ascorbic Acid (VITAMIN C) 100 MG tablet Take 100 mg by mouth daily.  07/30/11  Yes Historical Provider, MD  aspirin 81 MG tablet Take 81 mg by mouth daily.  07/30/11  Yes Historical Provider, MD  Cholecalciferol (VITAMIN D-3 PO) Take 1 tablet by mouth daily.   Yes Historical Provider, MD  cyanocobalamin 1000 MCG tablet Take 1,000 mcg by mouth daily.  07/30/11  Yes Historical Provider, MD  doxazosin (CARDURA) 8 MG tablet TAKE 1 TABLET BY MOUTH ONCE A DAY 05/22/15  Yes Richard Maceo Pro., MD  furosemide (LASIX) 20 MG tablet TAKE 1 TABLET BY MOUTH ONCE A DAY 07/07/15  Yes Richard Maceo Pro., MD  ibuprofen (ADVIL,MOTRIN) 800 MG tablet Take 800 mg by mouth every 8 (eight) hours as needed.  01/23/13  Yes Historical Provider, MD  Multiple Vitamin tablet Take 1 tablet by mouth daily.  07/30/11  Yes Historical Provider, MD  quinapril (ACCUPRIL) 20 MG tablet TAKE 1 TABLET BY MOUTH ONCE A DAY 01/20/15  Yes Jerrol Banana., MD  temazepam (RESTORIL) 30 MG capsule Take 30 mg by mouth at bedtime as needed.  03/22/13  Yes Historical Provider, MD  vitamin E 200 UNIT  capsule Take 400 Units by mouth.  07/30/11  Yes Historical Provider, MD    Patient Active Problem List   Diagnosis Date Noted  . Benign fibroma of prostate 11/01/2014  . ED (erectile dysfunction) of organic origin 11/01/2014  . Acid reflux 11/01/2014  . HLD (hyperlipidemia) 11/01/2014  . BP (high blood pressure) 11/01/2014  . Adiposity 11/01/2014  . Arthritis, degenerative 11/01/2014  . Cannot sleep 11/01/2014  . MI (mitral incompetence) 11/29/2013  . Personal history of colonic polyps 03/25/2013    Past Medical History  Diagnosis Date  . Arthritis   . Hypertension   . Colon polyp 2003    Social History   Social History  . Marital Status: Married    Spouse Name: N/A  . Number of Children: N/A  . Years of Education: N/A   Occupational History  . Not on file.   Social History Main Topics  . Smoking status: Former Smoker -- 40 years  . Smokeless tobacco: Never Used  . Alcohol Use: No  . Drug Use: No  . Sexual Activity: Not on file   Other Topics Concern  . Not on file   Social History Narrative    No Known Allergies  Review of Systems  Constitutional: Negative.   Respiratory: Negative.   Cardiovascular: Negative.   Musculoskeletal: Positive for myalgias,  back pain and joint pain.    Immunization History  Administered Date(s) Administered  . Influenza, High Dose Seasonal PF 11/01/2014  . Influenza-Unspecified 10/21/2013  . Pneumococcal Conjugate-13 10/27/2013  . Pneumococcal Polysaccharide-23 07/30/2011   Objective:  BP 132/96 mmHg  Pulse 58  Temp(Src) 98.4 F (36.9 C)  Resp 16  Physical Exam  Constitutional: He is oriented to person, place, and time and well-developed, well-nourished, and in no distress.  HENT:  Head: Normocephalic and atraumatic.  Eyes: Conjunctivae are normal. Pupils are equal, round, and reactive to light.  Cardiovascular: Normal rate, regular rhythm, normal heart sounds and intact distal pulses.   No murmur  heard. Pulmonary/Chest: Effort normal and breath sounds normal. No respiratory distress. He has no wheezes.  Musculoskeletal:  Left side notch, straight leg raise positive on the left, figure 4 negative  Neurological: He is alert and oriented to person, place, and time. He has normal reflexes. He displays normal reflexes. He exhibits normal muscle tone. Gait normal. Coordination normal.  Psychiatric: Mood, memory, affect and judgment normal.    Lab Results  Component Value Date   WBC 3.8 04/30/2015   HGB 13.1 04/30/2015   HCT 38.7* 04/30/2015   PLT 188 04/30/2015   GLUCOSE 110* 04/30/2015   CHOL 142 04/26/2014   TRIG 51 04/26/2014   HDL 38 04/26/2014   LDLCALC 94 04/26/2014   TSH 5.13 04/26/2014    CMP     Component Value Date/Time   NA 136 04/30/2015 1032   NA 142 04/26/2014   K 3.8 04/30/2015 1032   CL 106 04/30/2015 1032   CO2 26 04/30/2015 1032   GLUCOSE 110* 04/30/2015 1032   BUN 13 04/30/2015 1032   BUN 11 04/26/2014   CREATININE 0.85 04/30/2015 1032   CREATININE 1.0 04/26/2014   CALCIUM 8.2* 04/30/2015 1032   AST 19 04/26/2014   ALT 16 04/26/2014   ALKPHOS 86 04/26/2014   GFRNONAA >60 04/30/2015 1032   GFRAA >60 04/30/2015 1032    Assessment and Plan :  1. Back strain, initial encounter New. Seems to be muscular, strain. Discussed with patient using heating pad. Start medications. Advised patient that it can take 2 to 3 weeks to resolve. Follow as needed.  Do not think xray is needed at this time. - predniSONE (STERAPRED UNI-PAK 21 TAB) 10 MG (21) TBPK tablet; Take 1 tablet (10 mg total) by mouth daily. As directed  Dispense: 21 tablet; Refill: 0 - HYDROcodone-acetaminophen (NORCO/VICODIN) 5-325 MG tablet; Take 1 tablet by mouth every 4 (four) hours as needed for moderate pain.  Dispense: 55 tablet; Refill: 0 - cyclobenzaprine (FLEXERIL) 10 MG tablet; Take 1 tablet (10 mg total) by mouth every 8 (eight) hours as needed for muscle spasms.  Dispense: 55 tablet;  Refill: 0  2. Other osteoarthritis involving multiple joints 3.Obesity 4.HTN Patient was seen and examined by Dr. Eulas Post and note was scribed by Theressa Millard, [place].    Miguel Aschoff MD [facility] 07/10/2015 11:15 AM

## 2015-07-31 ENCOUNTER — Other Ambulatory Visit: Payer: Self-pay | Admitting: Family Medicine

## 2015-08-03 ENCOUNTER — Other Ambulatory Visit: Payer: Self-pay | Admitting: Family Medicine

## 2015-08-03 ENCOUNTER — Other Ambulatory Visit: Payer: Self-pay

## 2015-08-03 DIAGNOSIS — S39012A Strain of muscle, fascia and tendon of lower back, initial encounter: Secondary | ICD-10-CM

## 2015-08-03 NOTE — Telephone Encounter (Signed)
refill request from [facility] for Norco=-aa

## 2015-08-04 MED ORDER — HYDROCODONE-ACETAMINOPHEN 5-325 MG PO TABS: 1.0000 | ORAL_TABLET | ORAL | Status: DC | PRN

## 2015-08-17 ENCOUNTER — Ambulatory Visit: Payer: PPO | Admitting: Family Medicine

## 2015-09-13 DIAGNOSIS — H2513 Age-related nuclear cataract, bilateral: Secondary | ICD-10-CM | POA: Diagnosis not present

## 2015-10-10 ENCOUNTER — Ambulatory Visit (INDEPENDENT_AMBULATORY_CARE_PROVIDER_SITE_OTHER): Payer: PPO | Admitting: Family Medicine

## 2015-10-10 DIAGNOSIS — Z23 Encounter for immunization: Secondary | ICD-10-CM | POA: Diagnosis not present

## 2015-10-13 ENCOUNTER — Ambulatory Visit: Payer: PPO | Admitting: Family Medicine

## 2015-11-08 ENCOUNTER — Ambulatory Visit (INDEPENDENT_AMBULATORY_CARE_PROVIDER_SITE_OTHER): Payer: PPO | Admitting: Family Medicine

## 2015-11-08 ENCOUNTER — Encounter: Payer: Self-pay | Admitting: Family Medicine

## 2015-11-08 VITALS — BP 160/84 | HR 36 | Temp 98.5°F | Resp 16 | Ht 70.5 in | Wt 280.0 lb

## 2015-11-08 DIAGNOSIS — Z0000 Encounter for general adult medical examination without abnormal findings: Secondary | ICD-10-CM

## 2015-11-08 DIAGNOSIS — N471 Phimosis: Secondary | ICD-10-CM | POA: Diagnosis not present

## 2015-11-08 DIAGNOSIS — R001 Bradycardia, unspecified: Secondary | ICD-10-CM

## 2015-11-08 DIAGNOSIS — E6609 Other obesity due to excess calories: Secondary | ICD-10-CM | POA: Diagnosis not present

## 2015-11-08 NOTE — Progress Notes (Signed)
Patient: Patrick Thornton, Male    DOB: Aug 26, 1940, 75 y.o.   MRN: [id number] Visit Date: 11/08/2015  Today's Provider: [name], MD   Chief Complaint  Patient presents with  . Medicare Wellness  . Bradycardia   Subjective:    Annual wellness visit Patrick Thornton is a 75 y.o. male. He feels well. He reports exercising none. He reports he is sleeping fairly well. Patient overall is doing okay. Wife who is dependent on him for just about everything. She has severe bipolar disorder and and morbid obesity.  ----------------------------------------------------------- Last colonoscopy- 04/13/2013- no atypia or malignancy, no need to FU again.  Bradycardia Pt's HR is 36 today. Denies chest pain and SOB. States he did have left arm pain last night, and "can see his heart rate in his eyes".  Review of Systems  HENT: Positive for tinnitus.   Eyes: Positive for pain.  Cardiovascular: Positive for leg swelling.  Genitourinary: Positive for penile pain.  Musculoskeletal: Positive for back pain.  Allergic/Immunologic: Negative.   Neurological: Positive for numbness.       Patient feels like he has neuropathy of the feet as they feel numb.  Hematological: Bruises/bleeds easily.  All other systems reviewed and are negative.   Social History   Social History  . Marital status: Married    Spouse name: Patrick Thornton  . Number of children: 4  . Years of education: N/A   Occupational History  . retired    Social History Main Topics  . Smoking status: Former Smoker    Years: 40.00  . Smokeless tobacco: Never Used  . Alcohol use No  . Drug use: No  . Sexual activity: Not on file   Other Topics Concern  . Not on file   Social History Narrative  . No narrative on file    Past Medical History:  Diagnosis Date  . Arthritis   . Colon polyp 2003  . Hypertension      Patient Active Problem List   Diagnosis Date Noted  . Benign fibroma of prostate 11/01/2014  .  ED (erectile dysfunction) of organic origin 11/01/2014  . Acid reflux 11/01/2014  . HLD (hyperlipidemia) 11/01/2014  . BP (high blood pressure) 11/01/2014  . Adiposity 11/01/2014  . Arthritis, degenerative 11/01/2014  . Cannot sleep 11/01/2014  . MI (mitral incompetence) 11/29/2013  . Personal history of colonic polyps 03/25/2013    Past Surgical History:  Procedure Laterality Date  . CHOLECYSTECTOMY    . COLONOSCOPY  03-22-08   Dr Patrick Thornton  . FRACTURE SURGERY    . HERNIA REPAIR    . OTHER SURGICAL HISTORY     transanal excision of a villous adenoma    His family history includes CAD in his brother; COPD in his son; Cancer in his father; Diabetes in his father and son; Heart attack in his father and son; Hyperlipidemia in his brother; Hypertension in his father; Obesity in his son; Stroke in his mother.    Current Meds  Medication Sig  . aspirin 81 MG tablet Take 81 mg by mouth daily.   . cyanocobalamin 1000 MCG tablet Take 1,000 mcg by mouth daily.   . cyclobenzaprine (FLEXERIL) 10 MG tablet TAKE 1 TABLET BY MOUTH EVERY 8 HOURS AS NEEDED FOR MUSCLE SPASMS.  Marland Kitchen doxazosin (CARDURA) 8 MG tablet TAKE 1 TABLET BY MOUTH ONCE A DAY  . furosemide (LASIX) 20 MG tablet TAKE 1 TABLET BY MOUTH ONCE A DAY  .  ibuprofen (ADVIL,MOTRIN) 800 MG tablet Take 800 mg by mouth every 8 (eight) hours as needed.   . Multiple Vitamin tablet Take 1 tablet by mouth daily.   . quinapril (ACCUPRIL) 20 MG tablet TAKE 1 TABLET BY MOUTH ONCE A DAY    Patient Care Team: Patrick Banana., MD as PCP - General (Family Medicine) Patrick Bellow, MD (General Surgery)    Objective:   Vitals: BP (!) 160/84 (BP Location: Right Arm, Patient Position: Sitting, Cuff Size: Large)   Pulse (!) 36   Temp 98.5 F (36.9 C) (Oral)   Resp 16   Ht 5' 10.5" (1.791 m)   Wt 280 lb (127 kg)   SpO2 97%   BMI 39.61 kg/m   Physical Exam  Constitutional: He is oriented to person, place, and time. He appears  well-developed and well-nourished.  HENT:  Head: Normocephalic and atraumatic.  Right Ear: External ear normal.  Left Ear: External ear normal.  Nose: Nose normal.  Mouth/Throat: Oropharynx is clear and moist. No oropharyngeal exudate.  Eyes: Conjunctivae and EOM are normal. Pupils are equal, round, and reactive to light. Right eye exhibits no discharge. Left eye exhibits no discharge.  Neck: Normal range of motion. Neck supple. No tracheal deviation present. No thyromegaly present.  Cardiovascular: An irregular rhythm present. Bradycardia present.   Pulmonary/Chest: Effort normal and breath sounds normal. No respiratory distress.  Abdominal: Soft. He exhibits no distension. There is no tenderness.  Genitourinary: Rectum normal and prostate normal. Uncircumcised. Phimosis present.  Genitourinary Comments: Foreskin of the penis is thickened and will not retract.  Musculoskeletal: Normal range of motion. He exhibits edema.  Lymphadenopathy:    He has no cervical adenopathy.  Neurological: He is alert and oriented to person, place, and time. He has normal reflexes.  Neurosensory exam of the legs and feet isnormal  Skin: Skin is warm and dry.  Psychiatric: He has a normal mood and affect. His behavior is normal. Judgment and thought content normal.    Activities of Daily Living In your present state of health, do you have any difficulty performing the following activities: 11/08/2015  Hearing? N  Vision? N  Difficulty concentrating or making decisions? N  Walking or climbing stairs? N  Dressing or bathing? N  Doing errands, shopping? N  Some recent data might be hidden    Fall Risk Assessment Fall Risk  11/08/2015 11/01/2014  Falls in the past year? Yes No  Number falls in past yr: 1 -  Injury with Fall? Yes -     Depression Screen PHQ 2/9 Scores 11/08/2015 11/01/2014  PHQ - 2 Score 0 0    Cognitive Testing - 6-CIT  Correct? Score   What year is it? yes 0 0 or 4  What  month is it? yes 0 0 or 3  Memorize:    Patrick Thornton,  [birth year],  [place],      What time is it? (within 1 hour) yes 0 0 or 3  Count backwards from 20 yes 0 0, 2, or 4  Name the months of the year yes 0 0, 2, or 4  Repeat name & address above yes 0 0, 2, 4, 6, 8, or 10       TOTAL SCORE  0/28   Interpretation:  Normal  Normal (0-7) Abnormal (8-28)       Assessment & Plan:     Annual Wellness Visit  Reviewed patient's Family Medical History Reviewed and  updated list of patient's medical providers Assessment of cognitive impairment was done Assessed patient's functional ability Established a written schedule for health screening [place] Completed and Reviewed  Exercise Activities and Dietary recommendations Goals    None      Immunization History  Administered Date(s) Administered  . Influenza, High Dose Seasonal PF 11/01/2014, 10/10/2015  . Influenza-Unspecified 10/21/2013  . Pneumococcal Conjugate-13 10/27/2013  . Pneumococcal Polysaccharide-23 07/30/2011    Health Maintenance  Topic Date Due  . TETANUS/TDAP  10/10/1960  . ZOSTAVAX  10/10/2001  . COLONOSCOPY  04/14/2023  . INFLUENZA VACCINE  Completed  . PNA vac Low Risk Adult  Completed      Discussed health benefits of physical activity, and encouraged him to engage in regular exercise appropriate for his age and condition.    ------------------------------------------------------------------------------------------------------------  1. Medicare annual wellness visit, subsequent   2. Bradycardia [place] today. Asymptomatic. Pt given copy of today's EKG to give cardiology at FU.Pulse is about 40-50 today. Discussed why this is an issue. If he becomes symptomatic at all will let us know. - EKG 12-Lead   3. Phimosis Refer to urology. - Ambulatory referral to Urology  I have done the exam and reviewed the above chart and it is accurate to the best of my  knowledge.  Jarl Sellitto Cranford Mon, MD  [facility]

## 2015-11-09 DIAGNOSIS — I10 Essential (primary) hypertension: Secondary | ICD-10-CM | POA: Diagnosis not present

## 2015-11-09 DIAGNOSIS — I340 Nonrheumatic mitral (valve) insufficiency: Secondary | ICD-10-CM | POA: Diagnosis not present

## 2015-11-09 DIAGNOSIS — R001 Bradycardia, unspecified: Secondary | ICD-10-CM | POA: Diagnosis not present

## 2015-11-09 DIAGNOSIS — R600 Localized edema: Secondary | ICD-10-CM | POA: Diagnosis not present

## 2015-11-29 ENCOUNTER — Other Ambulatory Visit: Payer: Self-pay | Admitting: Family Medicine

## 2015-11-30 ENCOUNTER — Ambulatory Visit: Payer: PPO

## 2015-11-30 DIAGNOSIS — X32XXXA Exposure to sunlight, initial encounter: Secondary | ICD-10-CM | POA: Diagnosis not present

## 2015-11-30 DIAGNOSIS — L570 Actinic keratosis: Secondary | ICD-10-CM | POA: Diagnosis not present

## 2015-11-30 DIAGNOSIS — D225 Melanocytic nevi of trunk: Secondary | ICD-10-CM | POA: Diagnosis not present

## 2015-11-30 DIAGNOSIS — D2262 Melanocytic nevi of left upper limb, including shoulder: Secondary | ICD-10-CM | POA: Diagnosis not present

## 2015-11-30 DIAGNOSIS — D2261 Melanocytic nevi of right upper limb, including shoulder: Secondary | ICD-10-CM | POA: Diagnosis not present

## 2015-11-30 DIAGNOSIS — D2272 Melanocytic nevi of left lower limb, including hip: Secondary | ICD-10-CM | POA: Diagnosis not present

## 2015-12-01 ENCOUNTER — Encounter: Payer: Self-pay | Admitting: Urology

## 2015-12-01 ENCOUNTER — Ambulatory Visit (INDEPENDENT_AMBULATORY_CARE_PROVIDER_SITE_OTHER): Payer: PPO | Admitting: Urology

## 2015-12-01 VITALS — BP 155/57 | HR 37 | Ht 70.0 in | Wt 280.0 lb

## 2015-12-01 DIAGNOSIS — N476 Balanoposthitis: Secondary | ICD-10-CM | POA: Diagnosis not present

## 2015-12-01 MED ORDER — CLOTRIMAZOLE-BETAMETHASONE 1-0.05 % EX CREA: 1.0000 "application " | TOPICAL_CREAM | Freq: Two times a day (BID) | CUTANEOUS | 3 refills | Status: AC

## 2015-12-01 NOTE — Progress Notes (Signed)
12/01/2015 3:50 PM   Patrick Thornton 05/24/1940 [id number]  Referring provider: Jerrol Thornton., MD [address] [place], [place] [address]  Chief Complaint  Patient presents with  . Phimosis    HPI: Patrick Thornton is a 75yo seen today for phimosis. The pt started having difficulty retracting 13 months ago and now it is a pinhole and he has burning and irritation. He gets sores for which he uses hydrogen peroxide. He denies difficulty urinating. He has a dull ache in the glans with no other associated symptoms. NO exacerbating/allevaiting events     PMH: Past Medical History:  Diagnosis Date  . Arthritis   . Colon polyp 2003  . Hypertension     Surgical History: Past Surgical History:  Procedure Laterality Date  . CHOLECYSTECTOMY    . COLONOSCOPY  03-22-08   Dr Patrick Thornton  . FRACTURE SURGERY    . HERNIA REPAIR    . OTHER SURGICAL HISTORY     transanal excision of a villous adenoma    Home Medications:    Medication List       Accurate as of 12/01/15  3:50 PM. Always use your most recent med list.          albuterol 108 (90 Base) MCG/ACT inhaler Commonly known as:  PROVENTIL HFA;VENTOLIN HFA Inhale 2 puffs into the lungs every 6 (six) hours as needed for wheezing or shortness of breath.   aspirin 81 MG tablet Take 81 mg by mouth daily.   cyanocobalamin 1000 MCG tablet Take 1,000 mcg by mouth daily.   cyclobenzaprine 10 MG tablet Commonly known as:  FLEXERIL TAKE 1 TABLET BY MOUTH EVERY 8 HOURS AS NEEDED FOR MUSCLE SPASMS.   doxazosin 8 MG tablet Commonly known as:  CARDURA TAKE 1 TABLET BY MOUTH ONCE A DAY   furosemide 20 MG tablet Commonly known as:  LASIX TAKE 1 TABLET BY MOUTH ONCE A DAY   ibuprofen 800 MG tablet Commonly known as:  ADVIL,MOTRIN TAKE 1 TABLET BY MOUTH TWICE A DAY AS NEEDED   Multiple Vitamin tablet Take 1 tablet by mouth daily.   quinapril 20 MG tablet Commonly known as:  ACCUPRIL TAKE 1 TABLET BY MOUTH ONCE  A DAY       Allergies: No Known Allergies  Family History: Family History  Problem Relation Age of Onset  . Stroke Mother   . Cancer Father     melanoma  . Diabetes Father   . Hypertension Father   . Heart attack Father   . Hyperlipidemia Brother   . Diabetes Son   . COPD Son   . Obesity Son   . Heart attack Son   . CAD Brother     Social History:  reports that he has quit smoking. He quit after 40.00 years of use. He has never used smokeless tobacco. He reports that he does not drink alcohol or use drugs.  ROS: UROLOGY Frequent Urination?: No Hard to postpone urination?: No Burning/pain with urination?: Yes Get up at night to urinate?: No Leakage of urine?: No Urine stream starts and stops?: No Trouble starting stream?: No Do you have to strain to urinate?: No Blood in urine?: No Urinary tract infection?: No Sexually transmitted disease?: No Injury to kidneys or bladder?: No Painful intercourse?: No Weak stream?: No Erection problems?: No Penile pain?: No  Gastrointestinal Nausea?: No Vomiting?: No Indigestion/heartburn?: No Diarrhea?: No Constipation?: No  Constitutional Fever: No Night sweats?: No Weight loss?: No Fatigue?:  No  Skin Skin rash/lesions?: No Itching?: No  Eyes Blurred vision?: No Double vision?: No  Ears/Nose/Throat Sore throat?: No Sinus problems?: No  Hematologic/Lymphatic Swollen glands?: No Easy bruising?: Yes  Cardiovascular Leg swelling?: Yes Chest pain?: No  Respiratory Cough?: No Shortness of breath?: No  Endocrine Excessive thirst?: No  Musculoskeletal Back pain?: No Joint pain?: Yes  Neurological Headaches?: No Dizziness?: No  Psychologic Depression?: No Anxiety?: No  Physical Exam: BP (!) 155/57   Pulse (!) 37   Ht 5\' 10"  (1.778 m)   Wt 127 kg (280 lb)   BMI 40.18 kg/m   Constitutional:  Alert and oriented, No acute distress. HEENT: [place] AT, moist mucus membranes.  Trachea midline, no  masses. Cardiovascular: No clubbing, cyanosis, or edema. Respiratory: Normal respiratory effort, no increased work of breathing. GI: Abdomen is soft, nontender, nondistended, no abdominal masses GU: No CVA tenderness. NO masses/lesions on penile shaft, scrotum and testis. Tight phimotic ring with erythema Skin: No rashes, bruises or suspicious lesions. Lymph: No cervical or inguinal adenopathy. Neurologic: Grossly intact, no focal deficits, moving all 4 extremities. Psychiatric: Normal mood and affect.  Laboratory Data: Lab Results  Component Value Date   WBC 3.8 04/30/2015   HGB 13.1 04/30/2015   HCT 38.7 (L) 04/30/2015   MCV 94.2 04/30/2015   PLT 188 04/30/2015    Lab Results  Component Value Date   CREATININE 0.85 04/30/2015    No results found for: PSA  No results found for: TESTOSTERONE  No results found for: HGBA1C  Urinalysis No results found for: COLORURINE, APPEARANCEUR, LABSPEC, PHURINE, GLUCOSEU, HGBUR, BILIRUBINUR, KETONESUR, PROTEINUR, UROBILINOGEN, NITRITE, LEUKOCYTESUR  Pertinent Imaging: none  Assessment & Plan:   1. Balanoposthitis: -clotrimazole-betamethosome BID for 1 month-RTC 1 month  There are no diagnoses linked to this encounter.  No Follow-up on file.  Patrick Bang, MD  [facility] [address] [place], [place] [address] [phone]

## 2016-01-02 ENCOUNTER — Encounter: Payer: Self-pay | Admitting: Urology

## 2016-01-02 ENCOUNTER — Other Ambulatory Visit: Payer: Self-pay | Admitting: Radiology

## 2016-01-02 ENCOUNTER — Ambulatory Visit: Payer: PPO | Admitting: Urology

## 2016-01-02 ENCOUNTER — Telehealth: Payer: Self-pay | Admitting: Radiology

## 2016-01-02 VITALS — BP 147/77 | HR 61 | Ht 71.0 in | Wt 277.5 lb

## 2016-01-02 DIAGNOSIS — N400 Enlarged prostate without lower urinary tract symptoms: Secondary | ICD-10-CM

## 2016-01-02 DIAGNOSIS — N471 Phimosis: Secondary | ICD-10-CM

## 2016-01-02 DIAGNOSIS — N476 Balanoposthitis: Secondary | ICD-10-CM

## 2016-01-02 NOTE — Progress Notes (Signed)
01/02/2016 7:02 AM   Patrick Thornton 1940-06-20 [id number]  Referring provider: Jerrol Banana., MD [address] [place], [place] [address]  CC: F/u phimosis  HPI: 1 - Phimosis - progressive pinhole phimosis x few years with foreskin irritative symptoms. Given trial of topicals with azole/steroid and reports improvement in irritation but still bothered by difficulty with hygiene and having to sit to void due to spraying form phimosis.  2 - Urinary Urgency - on doxazosin 8mg  at baseline for long h/o obstrutive symptoms with good control. No retention. He is on lasix for CHF.  PMH sig for CHF (not limiting), HTN.   Today "Patrick Thornton" is seen in f/u above after trial of topicals for phimosis.    PMH: Past Medical History:  Diagnosis Date  . Arthritis   . Colon polyp 2003  . Hypertension     Surgical History: Past Surgical History:  Procedure Laterality Date  . CHOLECYSTECTOMY    . COLONOSCOPY  03-22-08   Dr Bary Castilla  . FRACTURE SURGERY    . HERNIA REPAIR    . OTHER SURGICAL HISTORY     transanal excision of a villous adenoma    Home Medications:    Medication List       Accurate as of 01/02/16  7:02 AM. Always use your most recent med list.          albuterol 108 (90 Base) MCG/ACT inhaler Commonly known as:  PROVENTIL HFA;VENTOLIN HFA Inhale 2 puffs into the lungs every 6 (six) hours as needed for wheezing or shortness of breath.   aspirin 81 MG tablet Take 81 mg by mouth daily.   cyanocobalamin 1000 MCG tablet Take 1,000 mcg by mouth daily.   cyclobenzaprine 10 MG tablet Commonly known as:  FLEXERIL TAKE 1 TABLET BY MOUTH EVERY 8 HOURS AS NEEDED FOR MUSCLE SPASMS.   doxazosin 8 MG tablet Commonly known as:  CARDURA TAKE 1 TABLET BY MOUTH ONCE A DAY   furosemide 20 MG tablet Commonly known as:  LASIX TAKE 1 TABLET BY MOUTH ONCE A DAY   ibuprofen 800 MG tablet Commonly known as:  ADVIL,MOTRIN TAKE 1 TABLET BY MOUTH TWICE A DAY AS  NEEDED   Multiple Vitamin tablet Take 1 tablet by mouth daily.   quinapril 20 MG tablet Commonly known as:  ACCUPRIL TAKE 1 TABLET BY MOUTH ONCE A DAY       Allergies: No Known Allergies  Family History: Family History  Problem Relation Age of Onset  . Stroke Mother   . Cancer Father     melanoma  . Diabetes Father   . Hypertension Father   . Heart attack Father   . Hyperlipidemia Brother   . Diabetes Son   . COPD Son   . Obesity Son   . Heart attack Son   . CAD Brother     Social History:  reports that he has quit smoking. He quit after 40.00 years of use. He has never used smokeless tobacco. He reports that he does not drink alcohol or use drugs.      Review of Systems  Gastrointestinal (upper)  : Negative for upper GI symptoms  Gastrointestinal (lower) : Negative for lower GI symptoms  Constitutional : Negative for symptoms  Skin: Negative for skin symptoms  Eyes: Negative for eye symptoms  Ear/Nose/Throat : Negative for Ear/Nose/Throat symptoms  Hematologic/Lymphatic: Negative for Hematologic/Lymphatic symptoms  Cardiovascular : Negative for cardiovascular symptoms  Respiratory : Negative for respiratory symptoms  Endocrine: Negative for endocrine symptoms  Musculoskeletal: Negative for musculoskeletal symptoms  Neurological: Negative for neurological symptoms  Psychologic: Negative for psychiatric symptoms   Physical Exam: There were no vitals taken for this visit.  Constitutional:  Alert and oriented, No acute distress. HEENT: [place] AT, moist mucus membranes.  Trachea midline, no masses. Cardiovascular: No clubbing, cyanosis, or edema. Respiratory: Normal respiratory effort, no increased work of breathing. GI: Abdomen is soft, nontender, nondistended, no abdominal masses GU: No CVA tenderness. NO masses/lesions on penile shaft, scrotum and testis. Tight phimotic ring with erythema. No signs of active fungal or bacterial  infection.  Skin: No rashes, bruises or suspicious lesions. Lymph: No cervical or inguinal adenopathy. Neurologic: Grossly intact, no focal deficits, moving all 4 extremities. Psychiatric: Normal mood and affect.  Laboratory Data: Lab Results  Component Value Date   WBC 3.8 04/30/2015   HGB 13.1 04/30/2015   HCT 38.7 (L) 04/30/2015   MCV 94.2 04/30/2015   PLT 188 04/30/2015    Lab Results  Component Value Date   CREATININE 0.85 04/30/2015     Urinalysis No results found for: COLORURINE, APPEARANCEUR, LABSPEC, PHURINE, GLUCOSEU, HGBUR, BILIRUBINUR, KETONESUR, PROTEINUR, UROBILINOGEN, NITRITE, LEUKOCYTESUR  Pertinent Imaging: none  Assessment & Plan:     1 - Phimosis - improved with topicals but not meeting patient's goals. Discussed further therapy with circumcision v. Dorsal slit and he wants to proceed with circumcision. I agree. Risks, benefits, alternatives, expected peri-op course discussed.  He will continue topicals now PRN prior.   2 - Urinary Urgency - controlled on doxazosin for this and HTN, continue.   Alexis Frock, [facility] [address], [place], [place] [address] [phone]

## 2016-01-02 NOTE — Telephone Encounter (Signed)
Notified pt of surgery scheduled with Dr Erlene Quan on 01/09/16, pre-admit testing appt on 01/05/16 @9 :15 & to call day prior to surgery for arrival time to SDS. Advised pt to hold ASA beginning immediately per Dr Tresa Moore. Pt voices understanding.

## 2016-01-05 ENCOUNTER — Encounter
Admission: RE | Admit: 2016-01-05 | Discharge: 2016-01-05 | Disposition: A | Discharge status: Home / Self Care | Payer: PPO | Source: Ambulatory Visit | Attending: Urology | Admitting: Urology

## 2016-01-05 DIAGNOSIS — Z01812 Encounter for preprocedural laboratory examination: Secondary | ICD-10-CM | POA: Insufficient documentation

## 2016-01-05 DIAGNOSIS — N471 Phimosis: Secondary | ICD-10-CM | POA: Insufficient documentation

## 2016-01-05 HISTORY — DX: Sleep apnea, unspecified: G47.30

## 2016-01-05 HISTORY — DX: Edema, unspecified: R60.9

## 2016-01-05 HISTORY — DX: Cardiac arrhythmia, unspecified: I49.9

## 2016-01-05 LAB — BASIC METABOLIC PANEL
ANION GAP: 3 — AB (ref 5–15)
BUN: 14 mg/dL (ref 6–20)
CALCIUM: 8.7 mg/dL — AB (ref 8.9–10.3)
CO2: 29 mmol/L (ref 22–32)
CREATININE: 0.99 mg/dL (ref 0.61–1.24)
Chloride: 106 mmol/L (ref 101–111)
GLUCOSE: 107 mg/dL — AB (ref 65–99)
Potassium: 4.1 mmol/L (ref 3.5–5.1)
Sodium: 138 mmol/L (ref 135–145)

## 2016-01-05 LAB — CBC
HCT: 43.5 % (ref 40.0–52.0)
Hemoglobin: 14.8 g/dL (ref 13.0–18.0)
MCH: 31.8 pg (ref 26.0–34.0)
MCHC: 33.9 g/dL (ref 32.0–36.0)
MCV: 93.8 fL (ref 80.0–100.0)
PLATELETS: 230 10*3/uL (ref 150–440)
RBC: 4.64 MIL/uL (ref 4.40–5.90)
RDW: 13.9 % (ref 11.5–14.5)
WBC: 6.9 10*3/uL (ref 3.8–10.6)

## 2016-01-05 NOTE — Pre-Procedure Instructions (Signed)
CARDIAC CLEARANCE NOTE FROM DR [facility] REQUESTED. PATIENT SEEN 11/09/15. ALSO SENT TO [facility] AT DR Cherrie Gauze

## 2016-01-05 NOTE — Patient Instructions (Signed)
  Your procedure is scheduled on: 01/09/16 Report to Day Surgery. [facility] SECOND FLOOR To find out your arrival time please call [phone] between 1PM - 3PM on 01/08/16  Remember: Instructions that are not followed completely may result in serious medical risk, up to and including death, or upon the discretion of your surgeon and anesthesiologist your surgery may need to be rescheduled.    X____ 1. Do not eat food or drink liquids after midnight. No gum chewing or hard candies.     ____ 2. No Alcohol for 24 hours before or after surgery.   ____ 3. Do Not Smoke For 24 Hours Prior to Your Surgery.   ____ 4. Bring all medications with you on the day of surgery if instructed.    _X___ 5. Notify your doctor if there is any change in your medical condition     (cold, fever, infections).       Do not wear jewelry, make-up, hairpins, clips or nail polish.  Do not wear lotions, powders, or perfumes. You may wear deodorant.  Do not shave 48 hours prior to surgery. Men may shave face and neck.  Do not bring valuables to the hospital.    [facility] is not responsible for any belongings or valuables.               Contacts, dentures or bridgework may not be worn into surgery.  Leave your suitcase in the car. After surgery it may be brought to your room.  For patients admitted to the hospital, discharge time is determined by your                treatment team.   Patients discharged the day of surgery will not be allowed to drive home.     ____ Take these medicines the morning of surgery with A SIP OF WATER:    1.   2.   3.   4.  5.  6.  ____ Fleet Enema (as directed)   _X___ Use CHG Soap as directed  ____ Use inhalers on the day of surgery  ____ Stop metformin 2 days prior to surgery    ____ Take 1/2 of usual insulin dose the night before surgery and none on the morning of surgery.   _X___ Stop Coumadin/Plavix/aspirin on   ASPIRIN ALREADY STOPPED __X__ Stop  Anti-inflammatories on    NOT TAKING ____ Stop supplements until after surgery.    ____ Bring C-Pap to the hospital.

## 2016-01-08 NOTE — Pre-Procedure Instructions (Signed)
CLEARED BY DR Nehemiah Massed 01/08/16

## 2016-01-09 ENCOUNTER — Ambulatory Visit: Payer: PPO | Admitting: Anesthesiology

## 2016-01-09 ENCOUNTER — Ambulatory Visit
Admission: RE | Admit: 2016-01-09 | Discharge: 2016-01-09 | Disposition: A | Discharge status: Home / Self Care | Payer: PPO | Source: Ambulatory Visit | Attending: Urology | Admitting: Urology

## 2016-01-09 ENCOUNTER — Encounter: Payer: Self-pay | Admitting: *Deleted

## 2016-01-09 ENCOUNTER — Encounter
Admission: RE | Disposition: A | Discharge status: Home / Self Care | Payer: Self-pay | Source: Ambulatory Visit | Attending: Urology

## 2016-01-09 DIAGNOSIS — R3915 Urgency of urination: Secondary | ICD-10-CM | POA: Insufficient documentation

## 2016-01-09 DIAGNOSIS — Z79899 Other long term (current) drug therapy: Secondary | ICD-10-CM | POA: Diagnosis not present

## 2016-01-09 DIAGNOSIS — Z87891 Personal history of nicotine dependence: Secondary | ICD-10-CM | POA: Insufficient documentation

## 2016-01-09 DIAGNOSIS — Z7982 Long term (current) use of aspirin: Secondary | ICD-10-CM | POA: Insufficient documentation

## 2016-01-09 DIAGNOSIS — N471 Phimosis: Secondary | ICD-10-CM | POA: Diagnosis not present

## 2016-01-09 DIAGNOSIS — I110 Hypertensive heart disease with heart failure: Secondary | ICD-10-CM | POA: Insufficient documentation

## 2016-01-09 DIAGNOSIS — I509 Heart failure, unspecified: Secondary | ICD-10-CM | POA: Insufficient documentation

## 2016-01-09 HISTORY — DX: Fracture of unspecified part of unspecified clavicle, initial encounter for closed fracture: S42.009A

## 2016-01-09 HISTORY — PX: CIRCUMCISION: SHX1350

## 2016-01-09 HISTORY — DX: Personal history of other diseases of the digestive system: Z87.19

## 2016-01-09 SURGERY — CIRCUMCISION, ADULT
Anesthesia: General | Site: Penis | Wound class: Clean Contaminated

## 2016-01-09 MED ORDER — BACITRACIN ZINC 500 UNIT/GM EX OINT
TOPICAL_OINTMENT | CUTANEOUS | Status: AC
  Filled 2016-01-09: qty 28.35

## 2016-01-09 MED ORDER — FAMOTIDINE 20 MG PO TABS
ORAL_TABLET | ORAL | Status: AC
  Administered 2016-01-09: 20 mg via ORAL
  Filled 2016-01-09: qty 1

## 2016-01-09 MED ORDER — MIDAZOLAM HCL 2 MG/2ML IJ SOLN
INTRAMUSCULAR | Status: DC | PRN
  Administered 2016-01-09 (×2): 1 mg via INTRAVENOUS

## 2016-01-09 MED ORDER — GLYCOPYRROLATE 0.2 MG/ML IJ SOLN
INTRAMUSCULAR | Status: DC | PRN
  Administered 2016-01-09: 0.2 mg via INTRAVENOUS

## 2016-01-09 MED ORDER — FENTANYL CITRATE (PF) 100 MCG/2ML IJ SOLN: 25.0000 ug | INTRAMUSCULAR | Status: DC | PRN

## 2016-01-09 MED ORDER — FLUCONAZOLE 100MG IVPB
100.0000 mg | Freq: Once | INTRAVENOUS | Status: AC
  Administered 2016-01-09: 100 mg via INTRAVENOUS
  Filled 2016-01-09: qty 50

## 2016-01-09 MED ORDER — LACTATED RINGERS IV SOLN
INTRAVENOUS | Status: DC
  Administered 2016-01-09: 12:00:00 via INTRAVENOUS

## 2016-01-09 MED ORDER — ONDANSETRON HCL 4 MG/2ML IJ SOLN: 4.0000 mg | Freq: Once | INTRAMUSCULAR | Status: DC | PRN

## 2016-01-09 MED ORDER — LIDOCAINE 1% INJECTION FOR CIRCUMCISION
INJECTION | INTRAVENOUS | Status: DC | PRN
  Administered 2016-01-09: 30 mL via SUBCUTANEOUS

## 2016-01-09 MED ORDER — LIDOCAINE HCL (PF) 1 % IJ SOLN
INTRAMUSCULAR | Status: AC
  Filled 2016-01-09: qty 30

## 2016-01-09 MED ORDER — CLINDAMYCIN PHOSPHATE 900 MG/50ML IV SOLN: 900.0000 mg | INTRAVENOUS | Status: DC

## 2016-01-09 MED ORDER — PROPOFOL 500 MG/50ML IV EMUL
INTRAVENOUS | Status: DC | PRN
  Administered 2016-01-09: 100 ug/kg/min via INTRAVENOUS

## 2016-01-09 MED ORDER — LIDOCAINE HCL (CARDIAC) 20 MG/ML IV SOLN
INTRAVENOUS | Status: DC | PRN
  Administered 2016-01-09: 50 mg via INTRAVENOUS

## 2016-01-09 MED ORDER — FAMOTIDINE 20 MG PO TABS
20.0000 mg | ORAL_TABLET | Freq: Once | ORAL | Status: AC
  Administered 2016-01-09: 20 mg via ORAL

## 2016-01-09 MED ORDER — PROPOFOL 10 MG/ML IV BOLUS
INTRAVENOUS | Status: DC | PRN
Start: 2016-01-09 — End: 2016-01-09
  Administered 2016-01-09 (×2): 30 mg via INTRAVENOUS

## 2016-01-09 MED ORDER — CLINDAMYCIN PHOSPHATE 900 MG/50ML IV SOLN
INTRAVENOUS | Status: DC
Start: 2016-01-09 — End: 2016-01-09
  Filled 2016-01-09: qty 50

## 2016-01-09 MED ORDER — DOCUSATE SODIUM 100 MG PO CAPS: 100.0000 mg | ORAL_CAPSULE | Freq: Two times a day (BID) | ORAL | 0 refills | Status: DC

## 2016-01-09 MED ORDER — HYDROCODONE-ACETAMINOPHEN 5-325 MG PO TABS: 1.0000 | ORAL_TABLET | Freq: Four times a day (QID) | ORAL | 0 refills | Status: DC | PRN

## 2016-01-09 SURGICAL SUPPLY — 21 items
BLADE SURG 15 STRL LF DISP TIS (BLADE) ×1 IMPLANT
BLADE SURG 15 STRL SS (BLADE) ×1
CANISTER SUCT 1200ML W/VALVE (MISCELLANEOUS) ×2 IMPLANT
CHLORAPREP W/TINT 26ML (MISCELLANEOUS) ×2 IMPLANT
DRAPE LAPAROTOMY 77X122 PED (DRAPES) ×2 IMPLANT
ELECT REM PT RETURN 9FT ADLT (ELECTROSURGICAL) ×2
ELECTRODE REM PT RTRN 9FT ADLT (ELECTROSURGICAL) ×1 IMPLANT
GAUZE PETROLATUM 1 X8 (GAUZE/BANDAGES/DRESSINGS) ×2 IMPLANT
GAUZE STRETCH 2X75IN STRL (MISCELLANEOUS) ×2 IMPLANT
GLOVE BIO SURGEON STRL SZ 6.5 (GLOVE) ×2 IMPLANT
GOWN STRL REUS W/ TWL LRG LVL3 (GOWN DISPOSABLE) ×2 IMPLANT
GOWN STRL REUS W/TWL LRG LVL3 (GOWN DISPOSABLE) ×2
KIT RM TURNOVER STRD PROC AR (KITS) ×2 IMPLANT
LABEL OR SOLS (LABEL) ×2 IMPLANT
NEEDLE HYPO 25X1 1.5 SAFETY (NEEDLE) ×2 IMPLANT
NS IRRIG 500ML POUR BTL (IV SOLUTION) ×2 IMPLANT
PACK BASIN MINOR ARMC (MISCELLANEOUS) ×2 IMPLANT
SOL PREP PVP 2OZ (MISCELLANEOUS) ×2
SOLUTION PREP PVP 2OZ (MISCELLANEOUS) ×1 IMPLANT
SUT CHROMIC 3 0 SH 27 (SUTURE) ×4 IMPLANT
SYRINGE 10CC LL (SYRINGE) ×2 IMPLANT

## 2016-01-09 NOTE — H&P (View-Only) (Signed)
01/02/2016 7:02 AM   Patrick Thornton 1940-05-11 [id number]  Referring provider: Jerrol Banana., MD [address] [place], [place] [address]  CC: F/u phimosis  HPI: 1 - Phimosis - progressive pinhole phimosis x few years with foreskin irritative symptoms. Given trial of topicals with azole/steroid and reports improvement in irritation but still bothered by difficulty with hygiene and having to sit to void due to spraying form phimosis.  2 - Urinary Urgency - on doxazosin 8mg  at baseline for long h/o obstrutive symptoms with good control. No retention. He is on lasix for CHF.  PMH sig for CHF (not limiting), HTN.   Today "Patrick Thornton" is seen in f/u above after trial of topicals for phimosis.    PMH: Past Medical History:  Diagnosis Date  . Arthritis   . Colon polyp 2003  . Hypertension     Surgical History: Past Surgical History:  Procedure Laterality Date  . CHOLECYSTECTOMY    . COLONOSCOPY  03-22-08   Dr Bary Castilla  . FRACTURE SURGERY    . HERNIA REPAIR    . OTHER SURGICAL HISTORY     transanal excision of a villous adenoma    Home Medications:    Medication List       Accurate as of 01/02/16  7:02 AM. Always use your most recent med list.          albuterol 108 (90 Base) MCG/ACT inhaler Commonly known as:  PROVENTIL HFA;VENTOLIN HFA Inhale 2 puffs into the lungs every 6 (six) hours as needed for wheezing or shortness of breath.   aspirin 81 MG tablet Take 81 mg by mouth daily.   cyanocobalamin 1000 MCG tablet Take 1,000 mcg by mouth daily.   cyclobenzaprine 10 MG tablet Commonly known as:  FLEXERIL TAKE 1 TABLET BY MOUTH EVERY 8 HOURS AS NEEDED FOR MUSCLE SPASMS.   doxazosin 8 MG tablet Commonly known as:  CARDURA TAKE 1 TABLET BY MOUTH ONCE A DAY   furosemide 20 MG tablet Commonly known as:  LASIX TAKE 1 TABLET BY MOUTH ONCE A DAY   ibuprofen 800 MG tablet Commonly known as:  ADVIL,MOTRIN TAKE 1 TABLET BY MOUTH TWICE A DAY AS  NEEDED   Multiple Vitamin tablet Take 1 tablet by mouth daily.   quinapril 20 MG tablet Commonly known as:  ACCUPRIL TAKE 1 TABLET BY MOUTH ONCE A DAY       Allergies: No Known Allergies  Family History: Family History  Problem Relation Age of Onset  . Stroke Mother   . Cancer Father     melanoma  . Diabetes Father   . Hypertension Father   . Heart attack Father   . Hyperlipidemia Brother   . Diabetes Son   . COPD Son   . Obesity Son   . Heart attack Son   . CAD Brother     Social History:  reports that he has quit smoking. He quit after 40.00 years of use. He has never used smokeless tobacco. He reports that he does not drink alcohol or use drugs.      Review of Systems  Gastrointestinal (upper)  : Negative for upper GI symptoms  Gastrointestinal (lower) : Negative for lower GI symptoms  Constitutional : Negative for symptoms  Skin: Negative for skin symptoms  Eyes: Negative for eye symptoms  Ear/Nose/Throat : Negative for Ear/Nose/Throat symptoms  Hematologic/Lymphatic: Negative for Hematologic/Lymphatic symptoms  Cardiovascular : Negative for cardiovascular symptoms  Respiratory : Negative for respiratory symptoms  Endocrine: Negative for endocrine symptoms  Musculoskeletal: Negative for musculoskeletal symptoms  Neurological: Negative for neurological symptoms  Psychologic: Negative for psychiatric symptoms   Physical Exam: There were no vitals taken for this visit.  Constitutional:  Alert and oriented, No acute distress. HEENT: [place] AT, moist mucus membranes.  Trachea midline, no masses. Cardiovascular: No clubbing, cyanosis, or edema. Respiratory: Normal respiratory effort, no increased work of breathing. GI: Abdomen is soft, nontender, nondistended, no abdominal masses GU: No CVA tenderness. NO masses/lesions on penile shaft, scrotum and testis. Tight phimotic ring with erythema. No signs of active fungal or bacterial  infection.  Skin: No rashes, bruises or suspicious lesions. Lymph: No cervical or inguinal adenopathy. Neurologic: Grossly intact, no focal deficits, moving all 4 extremities. Psychiatric: Normal mood and affect.  Laboratory Data: Lab Results  Component Value Date   WBC 3.8 04/30/2015   HGB 13.1 04/30/2015   HCT 38.7 (L) 04/30/2015   MCV 94.2 04/30/2015   PLT 188 04/30/2015    Lab Results  Component Value Date   CREATININE 0.85 04/30/2015     Urinalysis No results found for: COLORURINE, APPEARANCEUR, LABSPEC, PHURINE, GLUCOSEU, HGBUR, BILIRUBINUR, KETONESUR, PROTEINUR, UROBILINOGEN, NITRITE, LEUKOCYTESUR  Pertinent Imaging: none  Assessment & Plan:     1 - Phimosis - improved with topicals but not meeting patient's goals. Discussed further therapy with circumcision v. Dorsal slit and he wants to proceed with circumcision. I agree. Risks, benefits, alternatives, expected peri-op course discussed.  He will continue topicals now PRN prior.   2 - Urinary Urgency - controlled on doxazosin for this and HTN, continue.   Alexis Frock, [facility] [address], [place], [place] [address] [phone]

## 2016-01-09 NOTE — OR Nursing (Signed)
Heart rate on admission for surgery was in the 60's with a quick drop into the 30's while sitting casually.  The heart rate lasted at 30-37 for several minutes before increasing to 50- 60's.  Pulse ox remained on during preop stay with readings consistent in the 30's. Dr. Rosey Bath notified and requested to see patient.  No beta blockers. Patient states that this has happened before in the urology office and he was sent to cardiology.  Dr. Nehemiah Massed has cleared patient for surgery but told him if he continues to drop, he will need a holter monitor.  Patient understands and son is aware.  They agree to contact cardiology after surgery.

## 2016-01-09 NOTE — Op Note (Signed)
Date of procedure: 01/09/16  Preoperative diagnosis:  1. phimosis   Postoperative diagnosis:  1. phimosis   Procedure: 1.  circumcision  Surgeon: Hollice Espy, MD  Anesthesia: Monitored anesthesia care with penile block/ring block  Complications: None  Intraoperative findings: Tight phimosis with chronic skin thickening  EBL: Minimal  Specimens: None, foreskin not sent  Drains: None  Indication: Patrick Thornton is a 75 y.o. patient with severe phimosis desiring circumcision.  After reviewing the management options for treatment, he elected to proceed with the above surgical procedure(s). We have discussed the potential benefits and risks of the procedure, side effects of the proposed treatment, the likelihood of the patient achieving the goals of the procedure, and any potential problems that might occur during the procedure or recuperation. Informed consent has been obtained.  Description of procedure:  The patient was taken to the operating room and sedation was administered.  The patient was placed in the supine position, prepped and draped in the usual sterile fashion, and preoperative antibiotics were administered. A preoperative time-out was performed.   First, a penile block was performed. One percent lidocaine was used, 10 cc as a penile block an additional 10 cc as a ring block around the base of the penis. Anesthesia was tested and adequate. Given the inability to fully retract the foreskin, a clamp was placed in the dorsal aspect of the foreskin and then incised for a dorsal slit. The phimotic foreskin was unable to be retracted. The glandular adhesions were taken down bluntly.  Additional Betadine solution was used to clean the glans and foreskin. 2 circumferential incisions were then created, 1 proximally 1 cm below the coronal margin and one on the mid shaft. The foreskin was then removed in a sleevelike fashion. Careful hemostasis was then achieved. The skin edges were  then reapproximated in a simple interrupted fashion using 3-0 chromic suture.  The patient was then cleaned and dried. A dressing of Vaseline gauze, conform and Coban was applied. Additional bacitracin ointment was applied to the glans were a frenular adhesion was taken down. He tolerated the procedure well. He was taken the PACU in stable condition.  Plan: He will follow-up in 4 weeks for wound check.  Hollice Espy, M.D.

## 2016-01-09 NOTE — Discharge Instructions (Signed)
AMBULATORY SURGERY  DISCHARGE INSTRUCTIONS   1) The drugs that you were given will stay in your system until tomorrow so for the next 24 hours you should not:  A) Drive an automobile B) Make any legal decisions C) Drink any alcoholic beverage   2) You may resume regular meals tomorrow.  Today it is better to start with liquids and gradually work up to solid foods.  You may eat anything you prefer, but it is better to start with liquids, then soup and crackers, and gradually work up to solid foods.   3) Please notify your doctor immediately if you have any unusual bleeding, trouble breathing, redness and pain at the surgery site, drainage, fever, or pain not relieved by medication.    4) Additional Instructions:        Please contact your physician with any problems or Same Day Surgery at [phone], Monday through Friday 6 am to 4 pm, or [place] [facility] number at [phone].  Circumcision Information and Post Care Instructions  After-care: After the procedure your whole penis will be swollen and look very bruised. This is a normal effect of both the injected anaesthetic and the handling it necessarily receives during the operation. These will gradually reduce over the next week or two. Underwear If you normally wear boxers you may find that they give insufficient support immediately post procedure. You may wish to consider some form of briefs which will hold your penis in position to provide support and reduce the friction The Bandage The bandage will normally be wound tightly around the penis. Leave for 48hrs and keep clean and dry.    Promoting Healing Do not apply any antiseptic cream to your penis, nor add any antiseptic to bath water. Though they do help to kill germs, most are corrosive to new skin and actually slow down healing. In the rare cases where an infection develops, see a doctor as soon as possible. Smoking can delay healing and place you at  higher risk of infection. You should quit or significantly reduce the amount of cigarettes you smoke prior to and after procedure.  Pain Killers Everyone reacts differently in respect of pain. For most people circumcision will not be truly painful, but a degree of discomfort is to be expected during the first few days. If you choose to take pain killing tablets like Tylenol then follow the instructions precisely. Do not take more than the recommended maximum dose. Do not take Aspirin or any Aspirin based product since these thin the blood and have an anti-clotting action which can increase bleeding from a wound.  The Stitches Stitches need to remain in place long enough for the cut edges to knit together but not so long as to allow the skin around them to fully heal. In practice this usually means they should remain for between 1 and 2 weeks. Although the doctor will normally use soluble (or self-dissolving) stitches and will dissolve/ fall out on their own.    Time off School or Work There is no absolute need to take time off school or work after circumcision, but you may find it very hard to concentrate on work for the first few days and so may find it useful to take a week off. A week (or even two) off work is very desirable if you do heavy lifting or if your job keeps you seated and unable to move around freely for long periods. You should naturally avoid energetic or contact sports,  sexual activity, cycling and swimming until your circumcision has fully healed.

## 2016-01-09 NOTE — Anesthesia Preprocedure Evaluation (Signed)
Anesthesia Evaluation  Patient identified by MRN, date of birth, ID band Patient awake    Reviewed: Allergy & Precautions, H&P , NPO status , Patient's Chart, lab work & pertinent test results, reviewed documented beta blocker date and time   History of Anesthesia Complications Negative for: history of anesthetic complications  Airway Mallampati: II  TM Distance: >3 FB Neck ROM: full    Dental  (+) Caps, Partial Upper, Partial Lower, Poor Dentition, Missing   Pulmonary neg shortness of breath, sleep apnea , neg COPD, neg recent URI, former smoker,    Pulmonary exam normal breath sounds clear to auscultation       Cardiovascular Exercise Tolerance: Good hypertension, (-) angina(-) CAD, (-) Past MI, (-) Cardiac Stents and (-) CABG Normal cardiovascular exam+ dysrhythmias + Valvular Problems/Murmurs  Rhythm:regular Rate:Normal     Neuro/Psych negative neurological ROS  negative psych ROS   GI/Hepatic Neg liver ROS, hiatal hernia, GERD  ,  Endo/Other  negative endocrine ROS  Renal/GU negative Renal ROS  negative genitourinary   Musculoskeletal   Abdominal   Peds  Hematology negative hematology ROS (+)   Anesthesia Other Findings Past Medical History: No date: Arthritis No date: Collar bone fracture     Comment: x 2 2003: Colon polyp No date: Dysrhythmia     Comment: occasionally skips a beat and has               bradycardia(in the30's today) No date: Edema     Comment: legs/feet occas No date: History of hiatal hernia No date: Hypertension No date: Sleep apnea     Comment: 20 years ago and did not wear cpap long   Reproductive/Obstetrics negative OB ROS                             Anesthesia Physical Anesthesia Plan  ASA: II  Anesthesia Plan: General   Post-op Pain Management:    Induction:   Airway Management Planned:   Additional Equipment:   Intra-op Plan:    Post-operative Plan:   Informed Consent: I have reviewed the patients History and Physical, chart, labs and discussed the procedure including the risks, benefits and alternatives for the proposed anesthesia with the patient or authorized representative who has indicated his/her understanding and acceptance.   Dental Advisory Given  Plan Discussed with: Anesthesiologist, CRNA and Surgeon  Anesthesia Plan Comments:         Anesthesia Quick Evaluation

## 2016-01-09 NOTE — Interval H&P Note (Signed)
History and Physical Interval Note:  01/09/2016 11:22 AM  Patrick Thornton  has presented today for surgery, with the diagnosis of phimosis  The various methods of treatment have been discussed with the patient and family. After consideration of risks, benefits and other options for treatment, the patient has consented to  Procedure(s): CIRCUMCISION ADULT (N/A) as a surgical intervention .  The patient's history has been reviewed, patient examined, no change in status, stable for surgery.  I have reviewed the patient's chart and labs.  Questions were answered to the patient's satisfaction.    RRR CTAB  Hollice Espy

## 2016-01-09 NOTE — Transfer of Care (Signed)
Immediate Anesthesia Transfer of Care Note  Patient: Patrick Thornton  Procedure(s) Performed: Procedure(s): CIRCUMCISION ADULT (N/A)  Patient Location: PACU  Anesthesia Type:General  Level of Consciousness: awake and sedated  Airway & Oxygen Therapy: Patient Spontanous Breathing  Post-op Assessment: Report given to RN  Post vital signs: stable  Last Vitals:  Vitals:   01/09/16 1046 01/09/16 1112  BP:  (!) 143/62  Pulse: 65   Resp: 16   Temp: 36.5 C     Last Pain:  Vitals:   01/09/16 1046  TempSrc: Oral  PainSc: 0-No pain         Complications: No apparent anesthesia complications

## 2016-01-09 NOTE — Progress Notes (Signed)
Has an ocassional pvc's    Blood pressure good

## 2016-01-10 ENCOUNTER — Encounter: Payer: Self-pay | Admitting: Urology

## 2016-01-12 NOTE — Anesthesia Postprocedure Evaluation (Signed)
Anesthesia Post Note  Patient: Patrick Thornton  Procedure(s) Performed: Procedure(s) (LRB): CIRCUMCISION ADULT (N/A)  Patient location during evaluation: PACU Anesthesia Type: General Level of consciousness: awake and alert Pain management: pain level controlled Vital Signs Assessment: post-procedure vital signs reviewed and stable Respiratory status: spontaneous breathing, nonlabored ventilation, respiratory function stable and patient connected to nasal cannula oxygen Cardiovascular status: blood pressure returned to baseline and stable Postop Assessment: no signs of nausea or vomiting Anesthetic complications: no     Last Vitals:  Vitals:   01/09/16 1341 01/09/16 1400  BP: (!) 165/92 133/75  Pulse: 61 (!) 56  Resp: 12 20  Temp:  36.4 C    Last Pain:  Vitals:   01/10/16 1505  TempSrc:   PainSc: 0-No pain                 Martha Clan

## 2016-01-30 DIAGNOSIS — R42 Dizziness and giddiness: Secondary | ICD-10-CM | POA: Diagnosis not present

## 2016-01-30 DIAGNOSIS — R001 Bradycardia, unspecified: Secondary | ICD-10-CM | POA: Diagnosis not present

## 2016-01-30 DIAGNOSIS — I10 Essential (primary) hypertension: Secondary | ICD-10-CM | POA: Diagnosis not present

## 2016-02-05 DIAGNOSIS — R001 Bradycardia, unspecified: Secondary | ICD-10-CM | POA: Diagnosis not present

## 2016-02-05 DIAGNOSIS — R0600 Dyspnea, unspecified: Secondary | ICD-10-CM | POA: Diagnosis not present

## 2016-02-06 DIAGNOSIS — R001 Bradycardia, unspecified: Secondary | ICD-10-CM | POA: Diagnosis not present

## 2016-02-07 ENCOUNTER — Ambulatory Visit: Payer: PPO | Admitting: Urology

## 2016-02-12 DIAGNOSIS — I10 Essential (primary) hypertension: Secondary | ICD-10-CM | POA: Diagnosis not present

## 2016-02-12 DIAGNOSIS — I493 Ventricular premature depolarization: Secondary | ICD-10-CM | POA: Diagnosis not present

## 2016-02-12 DIAGNOSIS — R001 Bradycardia, unspecified: Secondary | ICD-10-CM | POA: Diagnosis not present

## 2016-02-13 ENCOUNTER — Encounter: Payer: Self-pay | Admitting: Urology

## 2016-02-13 ENCOUNTER — Ambulatory Visit (INDEPENDENT_AMBULATORY_CARE_PROVIDER_SITE_OTHER): Payer: PPO | Admitting: Urology

## 2016-02-13 VITALS — BP 154/80 | HR 79 | Ht 71.0 in | Wt 275.2 lb

## 2016-02-13 DIAGNOSIS — R3915 Urgency of urination: Secondary | ICD-10-CM

## 2016-02-13 DIAGNOSIS — N476 Balanoposthitis: Secondary | ICD-10-CM | POA: Diagnosis not present

## 2016-02-13 LAB — BLADDER SCAN AMB NON-IMAGING: Scan Result: 23

## 2016-02-13 NOTE — Progress Notes (Signed)
02/13/2016 3:39 PM   [name] Oct 15, 1940 [id number]  Referring provider: Jerrol Banana., MD [address] [place], [place] [address]  CC: F/u phimosis - s/p circumcision  HPI: Patient is a 76 year old [place] male who underwent a circumcision on 01/09/2016 with Dr. Erlene Quan for phimosis.  Background history 1 - Phimosis - progressive pinhole phimosis x few years with foreskin irritative symptoms. Was given trial of topicals with azole/steroid and reports improvement in irritation but was still bothered by difficulty with hygiene and having to sit to void due to spraying form phimosis.  He underwent circumcision and presents today for follow-up. He states post-procedurally he experience some mild penile swelling and pain.  Today, he states he has no further penile pain or swelling.  He denies any gross hematuria.  He has not had recent fevers, chills, nausea or vomiting.  He states the incision is well healed.  2 - Urinary Urgency - on doxazosin 8mg  at baseline for long h/o obstrutive symptoms with good control. No retention. He is on lasix for CHF.  PMH sig for CHF (not limiting), HTN.    PMH: Past Medical History:  Diagnosis Date  . Arthritis   . Collar bone fracture    x 2  . Colon polyp 2003  . Dysrhythmia    occasionally skips a beat and has bradycardia(in the30's today)  . Edema    legs/feet occas  . History of hiatal hernia   . Hypertension   . Sleep apnea    20 years ago and did not wear cpap long    Surgical History: Past Surgical History:  Procedure Laterality Date  . BUNIONECTOMY WITH HAMMERTOE RECONSTRUCTION Left 2011  . CHOLECYSTECTOMY  1970  . CIRCUMCISION N/A 01/09/2016   Procedure: CIRCUMCISION ADULT;  Surgeon: Hollice Espy, MD;  Location: [facility] ORS;  Service: Urology;  Laterality: N/A;  . COLONOSCOPY  03-22-08   Dr Bary Castilla  . FOOT SURGERY Left    x 2 pins  . FRACTURE SURGERY Left 1970   pins in left big toe and first toe    . HERNIA REPAIR  1970  . OTHER SURGICAL HISTORY     transanal excision of a villous adenoma    Home Medications:  Allergies as of 02/13/2016      Reactions   Tape Rash   Blisters; skin tore off when patient took off electrodes from wearing a holter monitor       Medication List       Accurate as of 02/13/16 11:59 PM. Always use your most recent med list.          aspirin 81 MG tablet Take 81 mg by mouth daily.   cyanocobalamin 1000 MCG tablet Take 1,000 mcg by mouth daily.   cyclobenzaprine 10 MG tablet Commonly known as:  FLEXERIL TAKE 1 TABLET BY MOUTH EVERY 8 HOURS AS NEEDED FOR MUSCLE SPASMS.   docusate sodium 100 MG capsule Commonly known as:  COLACE Take 1 capsule (100 mg total) by mouth 2 (two) times daily.   doxazosin 8 MG tablet Commonly known as:  CARDURA TAKE 1 TABLET BY MOUTH ONCE A DAY   furosemide 20 MG tablet Commonly known as:  LASIX TAKE 1 TABLET BY MOUTH ONCE A DAY   HYDROcodone-acetaminophen 5-325 MG tablet Commonly known as:  NORCO/VICODIN Take 1-2 tablets by mouth every 6 (six) hours as needed for moderate pain.   ibuprofen 800 MG tablet Commonly known as:  ADVIL,MOTRIN TAKE  1 TABLET BY MOUTH TWICE A DAY AS NEEDED   Multiple Vitamin tablet Take 1 tablet by mouth daily.   quinapril 20 MG tablet Commonly known as:  ACCUPRIL TAKE 1 TABLET BY MOUTH ONCE A DAY       Allergies:  Allergies  Allergen Reactions  . Tape Rash    Blisters; skin tore off when patient took off electrodes from wearing a holter monitor     Family History: Family History  Problem Relation Age of Onset  . Stroke Mother   . Cancer Father     melanoma  . Diabetes Father   . Hypertension Father   . Heart attack Father   . Hyperlipidemia Brother   . Diabetes Son   . COPD Son   . Obesity Son   . Heart attack Son   . CAD Brother     Social History:  reports that he quit smoking about 22 years ago. He quit after 40.00 years of use. He has never used  smokeless tobacco. He reports that he does not drink alcohol or use drugs. Psychologic Depression?: No Anxiety?: No  UROLOGY Frequent Urination?: No Hard to postpone urination?: Yes Burning/pain with urination?: No Get up at night to urinate?: Yes Leakage of urine?: No Urine stream starts and stops?: No Trouble starting stream?: No Do you have to strain to urinate?: No Blood in urine?: No Urinary tract infection?: No Sexually transmitted disease?: No Injury to kidneys or bladder?: No Painful intercourse?: No Weak stream?: No Erection problems?: No Penile pain?: No Gastrointestinal Nausea?: No Vomiting?: No Indigestion/heartburn?: No Diarrhea?: No Constipation?: No Constitutional Fever: No Night sweats?: No Weight loss?: No Fatigue?: No Skin Skin rash/lesions?: No Itching?: No Eyes Blurred vision?: No Double vision?: No Ears/Nose/Throat Sore throat?: No Sinus problems?: No Hematologic/Lymphatic Swollen glands?: No Easy bruising?: Yes Cardiovascular Leg swelling?: Yes Chest pain?: No Respiratory Cough?: No Shortness of breath?: No Endocrine Excessive thirst?: No Musculoskeletal Back pain?: No Joint pain?: No Neurological Headaches?: No Dizziness?: No Psychologic Depression?: No Anxiety?: No     Physical Exam: BP (!) 154/80 (BP Location: Left Arm, Patient Position: Sitting, Cuff Size: Large)   Pulse 79   Ht 5\' 11"  (1.803 m)   Wt 275 lb 3.2 oz (124.8 kg)   BMI 38.38 kg/m   Constitutional:  Alert and oriented, No acute distress. HEENT: [place] AT, moist mucus membranes.  Trachea midline, no masses. Cardiovascular: No clubbing, cyanosis, or edema. Respiratory: Normal respiratory effort, no increased work of breathing. GI: Abdomen is soft, nontender, nondistended, no abdominal masses GU: No CVA tenderness. NO masses/lesions on penile shaft, scrotum and testis. Circumcision wound is well-healed.  No erythema. No signs of active fungal or bacterial  infection.  Skin: No rashes, bruises or suspicious lesions. Lymph: No cervical or inguinal adenopathy. Neurologic: Grossly intact, no focal deficits, moving all 4 extremities. Psychiatric: Normal mood and affect.  Laboratory Data: Lab Results  Component Value Date   WBC 6.9 01/05/2016   HGB 14.8 01/05/2016   HCT 43.5 01/05/2016   MCV 93.8 01/05/2016   PLT 230 01/05/2016    Lab Results  Component Value Date   CREATININE 0.99 01/05/2016   Results for ZAMERE, PFEUFFER (MRN [id number]) as of 02/13/2016 15:05  Ref. Range 02/13/2016 15:01  Scan Result Unknown 23    Assessment & Plan:     1 - Phimosis -   - s/p circumcision - doing well  2 - Urinary Urgency - controlled on doxazosin for this and  HTN, continue.   - follow up for worsening symptoms  Zara Council, [facility] [address], [place], [place] [address] [phone]

## 2016-05-08 ENCOUNTER — Encounter: Payer: Self-pay | Admitting: Family Medicine

## 2016-05-08 ENCOUNTER — Ambulatory Visit (INDEPENDENT_AMBULATORY_CARE_PROVIDER_SITE_OTHER): Payer: PPO | Admitting: Family Medicine

## 2016-05-08 VITALS — BP 120/74 | HR 64 | Temp 98.1°F | Resp 16 | Wt 283.0 lb

## 2016-05-08 DIAGNOSIS — R55 Syncope and collapse: Secondary | ICD-10-CM | POA: Diagnosis not present

## 2016-05-08 DIAGNOSIS — R001 Bradycardia, unspecified: Secondary | ICD-10-CM

## 2016-05-08 DIAGNOSIS — I10 Essential (primary) hypertension: Secondary | ICD-10-CM

## 2016-05-08 NOTE — Progress Notes (Signed)
Subjective:  HPI  Hypertension, follow-up:  BP Readings from Last 3 Encounters:  05/08/16 120/74  02/13/16 (!) 154/80  01/09/16 133/75    He was last seen for hypertension 6 months ago.  BP at that visit was 133/75. Management since that visit includes none. He reports good compliance with treatment. He is not having side effects.  He is not exercising. He is adherent to low salt diet.   Outside blood pressures are not being checked. He is experiencing fatigue and near-syncope.  Patient denies chest pain, chest pressure/discomfort, claudication, dyspnea, exertional chest pressure/discomfort, irregular heart beat, lower extremity edema, orthopnea, palpitations, paroxysmal nocturnal dyspnea, syncope and tachypnea.   Cardiovascular risk factors include advanced age (older than 25 for men, 65 for women), hypertension, male gender and obesity (BMI >= 30 kg/m2).    Wt Readings from Last 3 Encounters:  05/08/16 283 lb (128.4 kg)  02/13/16 275 lb 3.2 oz (124.8 kg)  01/09/16 277 lb (125.6 kg)   ------------------------------------------------------------------------ He reports that last Friday he walking in the kitchen and he felt like he was going to pass out. This passed with in a couple minutes and he has not had this feeling since. He has felt fine otherwise. He recently had a cardiac work up with Dr. Nehemiah Massed. He was having dizziness at that time but this one episode was worse. He reports that he told him he may need a pacemaker at some point because he has "an extra beat". He denies chest pain, shortness of breath, palpitations etc.    Prior to Admission medications   Medication Sig Start Date End Date Taking? Authorizing Provider  aspirin 81 MG tablet Take 81 mg by mouth daily.  07/30/11   Historical Provider, MD  cyanocobalamin 1000 MCG tablet Take 1,000 mcg by mouth daily.  07/30/11   Historical Provider, MD  cyclobenzaprine (FLEXERIL) 10 MG tablet TAKE 1 TABLET BY MOUTH EVERY 8  HOURS AS NEEDED FOR MUSCLE SPASMS. 08/03/15   Hser Belanger Maceo Pro., MD  docusate sodium (COLACE) 100 MG capsule Take 1 capsule (100 mg total) by mouth 2 (two) times daily. 01/09/16   Hollice Espy, MD  doxazosin (CARDURA) 8 MG tablet TAKE 1 TABLET BY MOUTH ONCE A DAY Patient taking differently: Take 4mg s daily at night 05/22/15   Jerrol Banana., MD  furosemide (LASIX) 20 MG tablet TAKE 1 TABLET BY MOUTH ONCE A DAY 07/07/15   Abbegale Stehle Maceo Pro., MD  HYDROcodone-acetaminophen (NORCO/VICODIN) 5-325 MG tablet Take 1-2 tablets by mouth every 6 (six) hours as needed for moderate pain. 01/09/16   Hollice Espy, MD  ibuprofen (ADVIL,MOTRIN) 800 MG tablet TAKE 1 TABLET BY MOUTH TWICE A DAY AS NEEDED 11/29/15   Jerrol Banana., MD  Multiple Vitamin tablet Take 1 tablet by mouth daily.  07/30/11   Historical Provider, MD  quinapril (ACCUPRIL) 20 MG tablet TAKE 1 TABLET BY MOUTH ONCE A DAY Patient taking differently: TAKE 20MG  BY MOUTH ONCE A DAY AT NIGHT 07/31/15   Jerrol Banana., MD    Patient Active Problem List   Diagnosis Date Noted  . Balanoposthitis 12/01/2015  . Benign fibroma of prostate 11/01/2014  . ED (erectile dysfunction) of organic origin 11/01/2014  . Acid reflux 11/01/2014  . HLD (hyperlipidemia) 11/01/2014  . BP (high blood pressure) 11/01/2014  . Adiposity 11/01/2014  . Arthritis, degenerative 11/01/2014  . Cannot sleep 11/01/2014  . MI (mitral incompetence) 11/29/2013  . Personal history of colonic polyps  03/25/2013    Past Medical History:  Diagnosis Date  . Arthritis   . Collar bone fracture    x 2  . Colon polyp 2003  . Dysrhythmia    occasionally skips a beat and has bradycardia(in the30's today)  . Edema    legs/feet occas  . History of hiatal hernia   . Hypertension   . Sleep apnea    20 years ago and did not wear cpap long    Social History   Social History  . Marital status: Married    Spouse name: Sunday Spillers  . Number of children: 4  .  Years of education: N/A   Occupational History  . retired    Social History Main Topics  . Smoking status: Former Smoker    Years: 40.00    Quit date: 01/04/1994  . Smokeless tobacco: Never Used  . Alcohol use No  . Drug use: No  . Sexual activity: No   Other Topics Concern  . Not on file   Social History Narrative  . No narrative on file    Allergies  Allergen Reactions  . Tape Rash    Blisters; skin tore off when patient took off electrodes from wearing a holter monitor     Review of Systems  Constitutional: Positive for malaise/fatigue.  HENT: Negative.   Eyes: Negative.   Respiratory: Negative.   Cardiovascular: Negative.   Gastrointestinal: Negative.   Genitourinary: Negative.   Musculoskeletal: Negative.   Skin: Negative.   Neurological: Positive for dizziness.  Endo/Heme/Allergies: Negative.   Psychiatric/Behavioral: Negative.     Immunization History  Administered Date(s) Administered  . Influenza, High Dose Seasonal PF 11/01/2014, 10/10/2015  . Influenza-Unspecified 10/21/2013  . Pneumococcal Conjugate-13 10/27/2013  . Pneumococcal Polysaccharide-23 07/30/2011    Objective:  BP 120/74 (BP Location: Left Arm, Patient Position: Sitting, Cuff Size: Large)   Pulse 64   Temp 98.1 F (36.7 C) (Oral)   Resp 16   Wt 283 lb (128.4 kg)   SpO2 98%   BMI 39.47 kg/m   Physical Exam  Constitutional: He is oriented to person, place, and time and well-developed, well-nourished, and in no distress.  HENT:  Head: Normocephalic and atraumatic.  Right Ear: External ear normal.  Left Ear: External ear normal.  Nose: Nose normal.  Eyes: Conjunctivae and EOM are normal. Pupils are equal, round, and reactive to light.  Neck: Normal range of motion. Neck supple.  Cardiovascular: Normal rate, regular rhythm, normal heart sounds and intact distal pulses.   Pulmonary/Chest: Effort normal and breath sounds normal.  Abdominal: Soft.  Musculoskeletal: Normal range of  motion. He exhibits edema. Tenderness: trace.  Neurological: He is alert and oriented to person, place, and time. He has normal reflexes. Gait normal. GCS score is 15.  Skin: Skin is warm and dry.  Psychiatric: Mood, memory, affect and judgment normal.    Lab Results  Component Value Date   WBC 6.9 01/05/2016   HGB 14.8 01/05/2016   HCT 43.5 01/05/2016   PLT 230 01/05/2016   GLUCOSE 107 (H) 01/05/2016   CHOL 142 04/26/2014   TRIG 51 04/26/2014   HDL 38 04/26/2014   LDLCALC 94 04/26/2014   TSH 5.13 04/26/2014    CMP     Component Value Date/Time   NA 138 01/05/2016 0941   NA 142 04/26/2014   K 4.1 01/05/2016 0941   CL 106 01/05/2016 0941   CO2 29 01/05/2016 0941   GLUCOSE 107 (H) 01/05/2016 2376  BUN 14 01/05/2016 0941   BUN 11 04/26/2014   CREATININE 0.99 01/05/2016 0941   CALCIUM 8.7 (L) 01/05/2016 0941   AST 19 04/26/2014   ALT 16 04/26/2014   ALKPHOS 86 04/26/2014   GFRNONAA >60 01/05/2016 0941   GFRAA >60 01/05/2016 0941    Assessment and Plan :  1. Pre-syncope He has had cardio work up.  Pt will notify us if he gets this feeling again and will get in to see cardiology sooner.  - EKG 12-Lead Does not need neurology presently. 2. Essential hypertension Stable. Will get labs on next OV.  3. Bradycardia Followed by cardio. 4.Obesity HPI, Exam, and A&P Transcribed under the direction and in the presence of Maria Gallicchio L. Cranford Mon, MD  Electronically Signed: Katina Dung, CMA I have done the exam and reviewed the above chart and it is accurate to the best of my knowledge. Development worker, community has been used in this note in any air is in the dictation or transcription are unintentional.  [facility] 05/08/2016 2:12 PM

## 2016-05-24 ENCOUNTER — Other Ambulatory Visit: Payer: Self-pay | Admitting: Family Medicine

## 2016-06-25 ENCOUNTER — Other Ambulatory Visit: Payer: Self-pay | Admitting: Family Medicine

## 2016-07-10 ENCOUNTER — Ambulatory Visit (INDEPENDENT_AMBULATORY_CARE_PROVIDER_SITE_OTHER): Payer: PPO

## 2016-07-10 VITALS — BP 142/82 | HR 56 | Temp 98.5°F | Ht 71.0 in | Wt 283.0 lb

## 2016-07-10 DIAGNOSIS — Z0000 Encounter for general adult medical examination without abnormal findings: Secondary | ICD-10-CM | POA: Diagnosis not present

## 2016-07-10 NOTE — Progress Notes (Signed)
Subjective:   Patrick Thornton is a 76 y.o. male who presents for Medicare Annual/Subsequent preventive examination.  Review of Systems:  N/A  Cardiac Risk Factors include: advanced age (>26men, >75 women);obesity (BMI >30kg/m2);dyslipidemia;male gender;hypertension     Objective:    Vitals: BP (!) 142/82 (BP Location: Right Arm)   Pulse (!) 56   Temp 98.5 F (36.9 C) (Oral)   Ht 5\' 11"  (1.803 m)   Wt 283 lb (128.4 kg)   BMI 39.47 kg/m   Body mass index is 39.47 kg/m.  Tobacco History  Smoking Status  . Former Smoker  . Years: 40.00  . Quit date: 01/04/1994  Smokeless Tobacco  . Never Used     Counseling given: Not Answered   Past Medical History:  Diagnosis Date  . Arthritis   . Collar bone fracture    x 2  . Colon polyp 2003  . Dysrhythmia    occasionally skips a beat and has bradycardia(in the30's today)  . Edema    legs/feet occas  . History of hiatal hernia   . Hypertension   . Sleep apnea    20 years ago and did not wear cpap long   Past Surgical History:  Procedure Laterality Date  . BUNIONECTOMY WITH HAMMERTOE RECONSTRUCTION Left 2011  . CHOLECYSTECTOMY  1970  . CIRCUMCISION N/A 01/09/2016   Procedure: CIRCUMCISION ADULT;  Surgeon: Hollice Espy, MD;  Location: [facility] ORS;  Service: Urology;  Laterality: N/A;  . COLONOSCOPY  03-22-08   Dr Bary Castilla  . FOOT SURGERY Left    x 2 pins  . FRACTURE SURGERY Left 1970   pins in left big toe and first toe  . HERNIA REPAIR  1970  . OTHER SURGICAL HISTORY     transanal excision of a villous adenoma   Family History  Problem Relation Age of Onset  . Stroke Mother   . Cancer Father        melanoma  . Diabetes Father   . Hypertension Father   . Heart attack Father   . Hyperlipidemia Brother   . Diabetes Son   . COPD Son   . Obesity Son   . Heart attack Son   . Hepatitis C Son   . CAD Brother   . Diverticulitis Daughter    History  Sexual Activity  . Sexual activity: No    Outpatient  Encounter Prescriptions as of 07/10/2016  Medication Sig  . aspirin 81 MG tablet Take 81 mg by mouth daily.   . cyanocobalamin 1000 MCG tablet Take 1,000 mcg by mouth daily.   . cyclobenzaprine (FLEXERIL) 10 MG tablet TAKE 1 TABLET BY MOUTH EVERY 8 HOURS AS NEEDED FOR MUSCLE SPASMS.  Marland Kitchen docusate sodium (COLACE) 100 MG capsule Take 1 capsule (100 mg total) by mouth 2 (two) times daily. (Patient taking differently: Take 100 mg by mouth daily as needed. )  . doxazosin (CARDURA) 8 MG tablet TAKE 1 TABLET BY MOUTH ONCE A DAY  . furosemide (LASIX) 20 MG tablet TAKE 1 TABLET BY MOUTH ONCE A DAY (Patient taking differently: TAKE 1 TABLET BY MOUTH ONCE A DAY PRN)  . ibuprofen (ADVIL,MOTRIN) 800 MG tablet TAKE 1 TABLET BY MOUTH TWICE A DAY AS NEEDED  . Multiple Vitamin tablet Take 1 tablet by mouth daily.   . quinapril (ACCUPRIL) 20 MG tablet TAKE 1 TABLET BY MOUTH ONCE DAILY  . [DISCONTINUED] HYDROcodone-acetaminophen (NORCO/VICODIN) 5-325 MG tablet Take 1-2 tablets by mouth every 6 (six) hours as needed  for moderate pain. (Patient not taking: Reported on 05/08/2016)   No facility-administered encounter medications on file as of 07/10/2016.     Activities of Daily Living In your present state of health, do you have any difficulty performing the following activities: 07/10/2016 01/05/2016  Hearing? N N  Vision? Y N  Difficulty concentrating or making decisions? N N  Walking or climbing stairs? N N  Dressing or bathing? N N  Doing errands, shopping? N -  Preparing Food and eating ? N -  Using the Toilet? N -  In the past six months, have you accidently leaked urine? Y -  Do you have problems with loss of bowel control? N -  Managing your Medications? N -  Managing your Finances? N -  Housekeeping or managing your Housekeeping? N -  Some recent data might be hidden    Patient Care Team: Jerrol Banana., MD as PCP - General (Family Medicine) Bary Castilla, Forest Gleason, MD (General  Surgery) Birder Robson, MD as Referring Physician (Ophthalmology) Corey Skains, MD as Consulting Physician (Cardiology)   Assessment:     Exercise Activities and Dietary recommendations Current Exercise Habits: The patient does not participate in regular exercise at present (yardwork), Exercise limited by: None identified  Goals    . Exercise 3x per week (30 min per time)          Recommend increasing exercise. Pt to start walking 3 times a week for 30 minutes.       Fall Risk Fall Risk  07/10/2016 11/08/2015 11/01/2014  Falls in the past year? No Yes No  Number falls in past yr: - 1 -  Injury with Fall? - Yes -   Depression Screen PHQ 2/9 Scores 07/10/2016 11/08/2015 11/01/2014  PHQ - 2 Score 1 0 0    Cognitive Function     6CIT Screen 07/10/2016  What Year? 0 points  What month? 0 points  What time? 0 points  Count back from 20 0 points  Months in reverse 0 points  Repeat phrase 0 points  Total Score 0    Immunization History  Administered Date(s) Administered  . Influenza, High Dose Seasonal PF 11/01/2014, 10/10/2015  . Influenza-Unspecified 10/21/2013  . Pneumococcal Conjugate-13 10/27/2013  . Pneumococcal Polysaccharide-23 07/30/2011   Screening Tests Health Maintenance  Topic Date Due  . TETANUS/TDAP  01/21/2026 (Originally 10/10/1959)  . INFLUENZA VACCINE  08/21/2016  . COLONOSCOPY  04/14/2023  . PNA vac Low Risk Adult  Completed      Plan:  I have personally reviewed and addressed the Medicare Annual Wellness questionnaire and have noted the following in the patient's chart:  A. Medical and social history B. Use of alcohol, tobacco or illicit drugs  C. Current medications and supplements D. Functional ability and status E.  Nutritional status F.  Physical activity G. Advance directives H. List of other physicians I.  Hospitalizations, surgeries, and ER visits in previous 12 months J.  [place] such as hearing and vision if  needed, cognitive and depression L. Referrals and appointments - none  In addition, I have reviewed and discussed with patient certain preventive protocols, quality metrics, and best practice recommendations. A written personalized care plan for preventive services as well as general preventive health recommendations were provided to patient.  See attached scanned questionnaire for additional information.   Signed,  Fabio Neighbors, LPN Nurse Health Advisor   MD Recommendations: None, pt declined tetanus vaccine today.

## 2016-07-10 NOTE — Patient Instructions (Signed)
Patrick Thornton , Thank you for taking time to come for your Medicare Wellness Visit. I appreciate your ongoing commitment to your health goals. Please review the following plan we discussed and let me know if I can assist you in the future.   Screening recommendations/referrals: Colonoscopy: completed 04/13/13 Recommended yearly ophthalmology/optometry visit for glaucoma screening and checkup Recommended yearly dental visit for hygiene and checkup  Vaccinations: Influenza vaccine: up to date, due 09/2016 Pneumococcal vaccine: completed series Tdap vaccine: declined  Shingles vaccine: declined    Advanced directives: Already on file  Conditions/risks identified: Recommend increasing exercise. Pt to start walking 3 times a week for 30 minutes.   Next appointment: 11/12/16 @ 10:30 AM  Preventive Care 65 Years and Older, Male Preventive care refers to lifestyle choices and visits with your health care provider that can promote health and wellness. What does preventive care include?  A yearly physical exam. This is also called an annual well check.  Dental exams once or twice a year.  Routine eye exams. Ask your health care provider how often you should have your eyes checked.  Personal lifestyle choices, including:  Daily care of your teeth and gums.  Regular physical activity.  Eating a healthy diet.  Avoiding tobacco and drug use.  Limiting alcohol use.  Practicing safe sex.  Taking low doses of aspirin every day.  Taking vitamin and mineral supplements as recommended by your health care provider. What happens during an annual well check? The services and screenings done by your health care provider during your annual well check will depend on your age, overall health, lifestyle risk factors, and family history of disease. Counseling  Your health care provider may ask you questions about your:  Alcohol use.  Tobacco use.  Drug use.  Emotional well-being.  Home  and relationship well-being.  Sexual activity.  Eating habits.  History of falls.  Memory and ability to understand (cognition).  Work and work Statistician. Screening  You may have the following tests or measurements:  Height, weight, and BMI.  Blood pressure.  Lipid and cholesterol levels. These may be checked every 5 years, or more frequently if you are over 61 years old.  Skin check.  Lung cancer screening. You may have this screening every year starting at age 33 if you have a 30-pack-year history of smoking and currently smoke or have quit within the past 15 years.  Fecal occult blood test (FOBT) of the stool. You may have this test every year starting at age 46.  Flexible sigmoidoscopy or colonoscopy. You may have a sigmoidoscopy every 5 years or a colonoscopy every 10 years starting at age 33.  Prostate cancer screening. Recommendations will vary depending on your family history and other risks.  Hepatitis C blood test.  Hepatitis B blood test.  Sexually transmitted disease (STD) testing.  Diabetes screening. This is done by checking your blood sugar (glucose) after you have not eaten for a while (fasting). You may have this done every 1-3 years.  Abdominal aortic aneurysm (AAA) screening. You may need this if you are a current or former smoker.  Osteoporosis. You may be screened starting at age 58 if you are at high risk. Talk with your health care provider about your test results, treatment options, and if necessary, the need for more tests. Vaccines  Your health care provider may recommend certain vaccines, such as:  Influenza vaccine. This is recommended every year.  Tetanus, diphtheria, and acellular pertussis (Tdap, Td) vaccine. You  may need a Td booster every 10 years.  Zoster vaccine. You may need this after age 43.  Pneumococcal 13-valent conjugate (PCV13) vaccine. One dose is recommended after age 75.  Pneumococcal polysaccharide (PPSV23) vaccine.  One dose is recommended after age 21. Talk to your health care provider about which screenings and vaccines you need and how often you need them. This information is not intended to replace advice given to you by your health care provider. Make sure you discuss any questions you have with your health care provider. Document Released: 02/03/2015 Document Revised: 09/27/2015 Document Reviewed: 11/08/2014 Elsevier Interactive Patient Education  2017 [place] [employer] Prevention in the Home Falls can cause injuries. They can happen to people of all ages. There are many things you can do to make your home safe and to help prevent falls. What can I do on the outside of my home?  Regularly fix the edges of walkways and driveways and fix any cracks.  Remove anything that might make you trip as you walk through a door, such as a raised step or threshold.  Trim any bushes or trees on the path to your home.  Use bright outdoor lighting.  Clear any walking paths of anything that might make someone trip, such as rocks or tools.  Regularly check to see if handrails are loose or broken. Make sure that both sides of any steps have handrails.  Any raised decks and porches should have guardrails on the edges.  Have any leaves, snow, or ice cleared regularly.  Use sand or salt on walking paths during winter.  Clean up any spills in your garage right away. This includes oil or grease spills. What can I do in the bathroom?  Use night lights.  Install grab bars by the toilet and in the tub and shower. Do not use towel bars as grab bars.  Use non-skid mats or decals in the tub or shower.  If you need to sit down in the shower, use a plastic, non-slip stool.  Keep the floor dry. Clean up any water that spills on the floor as soon as it happens.  Remove soap buildup in the tub or shower regularly.  Attach bath mats securely with double-sided non-slip rug tape.  Do not have throw rugs and other  things on the floor that can make you trip. What can I do in the bedroom?  Use night lights.  Make sure that you have a light by your bed that is easy to reach.  Do not use any sheets or blankets that are too big for your bed. They should not hang down onto the floor.  Have a firm chair that has side arms. You can use this for support while you get dressed.  Do not have throw rugs and other things on the floor that can make you trip. What can I do in the kitchen?  Clean up any spills right away.  Avoid walking on wet floors.  Keep items that you use a lot in easy-to-reach places.  If you need to reach something above you, use a strong step stool that has a grab bar.  Keep electrical cords out of the way.  Do not use floor polish or wax that makes floors slippery. If you must use wax, use non-skid floor wax.  Do not have throw rugs and other things on the floor that can make you trip. What can I do with my stairs?  Do not leave any items  on the stairs.  Make sure that there are handrails on both sides of the stairs and use them. Fix handrails that are broken or loose. Make sure that handrails are as long as the stairways.  Check any carpeting to make sure that it is firmly attached to the stairs. Fix any carpet that is loose or worn.  Avoid having throw rugs at the top or bottom of the stairs. If you do have throw rugs, attach them to the floor with carpet tape.  Make sure that you have a light switch at the top of the stairs and the bottom of the stairs. If you do not have them, ask someone to add them for you. What else can I do to help prevent falls?  Wear shoes that:  Do not have high heels.  Have rubber bottoms.  Are comfortable and fit you well.  Are closed at the toe. Do not wear sandals.  If you use a stepladder:  Make sure that it is fully opened. Do not climb a closed stepladder.  Make sure that both sides of the stepladder are locked into place.  Ask  someone to hold it for you, if possible.  Clearly mark and make sure that you can see:  Any grab bars or handrails.  First and last steps.  Where the edge of each step is.  Use tools that help you move around (mobility aids) if they are needed. These include:  Canes.  Walkers.  Scooters.  Crutches.  Turn on the lights when you go into a dark area. Replace any light bulbs as soon as they burn out.  Set up your furniture so you have a clear path. Avoid moving your furniture around.  If any of your floors are uneven, fix them.  If there are any pets around you, be aware of where they are.  Review your medicines with your doctor. Some medicines can make you feel dizzy. This can increase your chance of falling. Ask your doctor what other things that you can do to help prevent falls. This information is not intended to replace advice given to you by your health care provider. Make sure you discuss any questions you have with your health care provider. Document Released: 11/03/2008 Document Revised: 06/15/2015 Document Reviewed: 02/11/2014 Elsevier Interactive Patient Education  2017 [employer].

## 2016-08-12 ENCOUNTER — Other Ambulatory Visit: Payer: Self-pay | Admitting: Family Medicine

## 2016-08-15 DIAGNOSIS — R001 Bradycardia, unspecified: Secondary | ICD-10-CM | POA: Diagnosis not present

## 2016-08-15 DIAGNOSIS — I10 Essential (primary) hypertension: Secondary | ICD-10-CM | POA: Diagnosis not present

## 2016-08-15 DIAGNOSIS — I340 Nonrheumatic mitral (valve) insufficiency: Secondary | ICD-10-CM | POA: Diagnosis not present

## 2016-08-15 DIAGNOSIS — E782 Mixed hyperlipidemia: Secondary | ICD-10-CM | POA: Diagnosis not present

## 2016-08-16 DIAGNOSIS — G4733 Obstructive sleep apnea (adult) (pediatric): Secondary | ICD-10-CM | POA: Diagnosis not present

## 2016-08-16 DIAGNOSIS — R0602 Shortness of breath: Secondary | ICD-10-CM | POA: Diagnosis not present

## 2016-09-09 DIAGNOSIS — H35033 Hypertensive retinopathy, bilateral: Secondary | ICD-10-CM | POA: Diagnosis not present

## 2016-09-17 DIAGNOSIS — R600 Localized edema: Secondary | ICD-10-CM | POA: Diagnosis not present

## 2016-09-17 DIAGNOSIS — I493 Ventricular premature depolarization: Secondary | ICD-10-CM | POA: Diagnosis not present

## 2016-09-17 DIAGNOSIS — I10 Essential (primary) hypertension: Secondary | ICD-10-CM | POA: Diagnosis not present

## 2016-09-17 DIAGNOSIS — E782 Mixed hyperlipidemia: Secondary | ICD-10-CM | POA: Diagnosis not present

## 2016-10-08 DIAGNOSIS — I272 Pulmonary hypertension, unspecified: Secondary | ICD-10-CM | POA: Diagnosis not present

## 2016-10-08 DIAGNOSIS — G4733 Obstructive sleep apnea (adult) (pediatric): Secondary | ICD-10-CM | POA: Diagnosis not present

## 2016-10-08 DIAGNOSIS — I10 Essential (primary) hypertension: Secondary | ICD-10-CM | POA: Diagnosis not present

## 2016-10-08 DIAGNOSIS — R600 Localized edema: Secondary | ICD-10-CM | POA: Diagnosis not present

## 2016-11-12 ENCOUNTER — Encounter: Payer: PPO | Admitting: Family Medicine

## 2016-11-28 DIAGNOSIS — D2271 Melanocytic nevi of right lower limb, including hip: Secondary | ICD-10-CM | POA: Diagnosis not present

## 2016-11-28 DIAGNOSIS — X32XXXA Exposure to sunlight, initial encounter: Secondary | ICD-10-CM | POA: Diagnosis not present

## 2016-11-28 DIAGNOSIS — D2272 Melanocytic nevi of left lower limb, including hip: Secondary | ICD-10-CM | POA: Diagnosis not present

## 2016-11-28 DIAGNOSIS — D045 Carcinoma in situ of skin of trunk: Secondary | ICD-10-CM | POA: Diagnosis not present

## 2016-11-28 DIAGNOSIS — D225 Melanocytic nevi of trunk: Secondary | ICD-10-CM | POA: Diagnosis not present

## 2016-11-28 DIAGNOSIS — L570 Actinic keratosis: Secondary | ICD-10-CM | POA: Diagnosis not present

## 2016-11-28 DIAGNOSIS — D485 Neoplasm of uncertain behavior of skin: Secondary | ICD-10-CM | POA: Diagnosis not present

## 2016-11-28 DIAGNOSIS — D2261 Melanocytic nevi of right upper limb, including shoulder: Secondary | ICD-10-CM | POA: Diagnosis not present

## 2017-01-06 DIAGNOSIS — D045 Carcinoma in situ of skin of trunk: Secondary | ICD-10-CM | POA: Diagnosis not present

## 2017-01-22 ENCOUNTER — Ambulatory Visit (INDEPENDENT_AMBULATORY_CARE_PROVIDER_SITE_OTHER): Payer: PPO | Admitting: Family Medicine

## 2017-01-22 VITALS — BP 126/72 | HR 58 | Temp 97.2°F | Resp 12 | Wt 284.2 lb

## 2017-01-22 DIAGNOSIS — N401 Enlarged prostate with lower urinary tract symptoms: Secondary | ICD-10-CM

## 2017-01-22 DIAGNOSIS — I10 Essential (primary) hypertension: Secondary | ICD-10-CM

## 2017-01-22 DIAGNOSIS — Z0001 Encounter for general adult medical examination with abnormal findings: Secondary | ICD-10-CM

## 2017-01-22 DIAGNOSIS — Z0000 Encounter for general adult medical examination without abnormal findings: Secondary | ICD-10-CM | POA: Diagnosis not present

## 2017-01-22 DIAGNOSIS — Z6839 Body mass index (BMI) 39.0-39.9, adult: Secondary | ICD-10-CM

## 2017-01-22 DIAGNOSIS — Z1211 Encounter for screening for malignant neoplasm of colon: Secondary | ICD-10-CM | POA: Diagnosis not present

## 2017-01-22 DIAGNOSIS — R739 Hyperglycemia, unspecified: Secondary | ICD-10-CM | POA: Diagnosis not present

## 2017-01-22 LAB — IFOBT (OCCULT BLOOD): IFOBT: NEGATIVE

## 2017-01-22 NOTE — Progress Notes (Signed)
Patient: Patrick Thornton, Male    DOB: [birth year], 77 y.o.   MRN: [id number] Visit Date: 01/22/2017  Today's Provider: [name], MD   Chief Complaint  Patient presents with  . Annual Exam   Subjective:  Patrick Thornton is a 77 y.o. male who presents today for health maintenance and complete physical. He feels fairly well. He reports exercising not at this time. He reports he is sleeping fairly well.  Last Colonoscopy was on 04/13/13 Dr Fleet Contras, submucosal cyst, consistent with leiomyoma of muscularis mucosae. Had Wellness visit with [facility] on 07/10/16. Second marriage. He has 4 children and 2 stepchildren.  Depression screen [facility] 2/9 07/10/2016 11/08/2015 11/01/2014  Decreased Interest 0 0 0  Down, Depressed, Hopeless 1 0 0  PHQ - 2 Score 1 0 0   6CIT Screen 07/10/2016  What Year? 0 points  What month? 0 points  What time? 0 points  Count back from 20 0 points  Months in reverse 0 points  Repeat phrase 0 points  Total Score 0   Fall Risk  07/10/2016 11/08/2015 11/01/2014  Falls in the past year? No Yes No  Number falls in past yr: - 1 -  Injury with Fall? - Yes -   Immunization History  Administered Date(s) Administered  . Influenza Inj Mdck Quad Pf 12/09/2016  . Influenza, High Dose Seasonal PF 11/01/2014, 10/10/2015  . Influenza-Unspecified 10/21/2013  . Pneumococcal Conjugate-13 10/27/2013  . Pneumococcal Polysaccharide-23 07/30/2011   Review of Systems  Constitutional: Negative.   HENT: Positive for tinnitus.   Eyes: Negative.   Respiratory: Negative.   Cardiovascular: Negative.   Gastrointestinal: Negative.   Endocrine: Negative.   Genitourinary: Negative.   Musculoskeletal: Positive for back pain (soreness off and on).  Skin: Negative.   Allergic/Immunologic: Negative.   Neurological: Negative.   Hematological: Bruises/bleeds easily.  Psychiatric/Behavioral: Negative.     Social History   Socioeconomic History  . Marital status: Married     Spouse name: Sunday Spillers  . Number of children: 4  . Years of education: Not on file  . Highest education level: Not on file  Social Needs  . Financial resource strain: Not on file  . Food insecurity - worry: Not on file  . Food insecurity - inability: Not on file  . Transportation needs - medical: Not on file  . Transportation needs - non-medical: Not on file  Occupational History  . Occupation: retired  Tobacco Use  . Smoking status: Former Smoker    Years: 40.00    Last attempt to quit: 01/04/1994    Years since quitting: 23.0  . Smokeless tobacco: Never Used  Substance and Sexual Activity  . Alcohol use: No  . Drug use: No  . Sexual activity: No  Other Topics Concern  . Not on file  Social History Narrative  . Not on file    Patient Active Problem List   Diagnosis Date Noted  . OSA (obstructive sleep apnea) 10/08/2016  . Balanoposthitis 12/01/2015  . Benign fibroma of prostate 11/01/2014  . ED (erectile dysfunction) of organic origin 11/01/2014  . Acid reflux 11/01/2014  . HLD (hyperlipidemia) 11/01/2014  . BP (high blood pressure) 11/01/2014  . Adiposity 11/01/2014  . Arthritis, degenerative 11/01/2014  . Cannot sleep 11/01/2014  . MI (mitral incompetence) 11/29/2013  . Personal history of colonic polyps 03/25/2013    Past Surgical History:  Procedure Laterality Date  . BUNIONECTOMY WITH HAMMERTOE RECONSTRUCTION Left 2011  . CHOLECYSTECTOMY  1970  .  CIRCUMCISION N/A 01/09/2016   Procedure: CIRCUMCISION ADULT;  Surgeon: Hollice Espy, MD;  Location: [facility] ORS;  Service: Urology;  Laterality: N/A;  . COLONOSCOPY  03-22-08   Dr Bary Castilla  . FOOT SURGERY Left    x 2 pins  . FRACTURE SURGERY Left 1970   pins in left big toe and first toe  . HERNIA REPAIR  1970  . OTHER SURGICAL HISTORY     transanal excision of a villous adenoma    His family history includes CAD in his brother; COPD in his son; Cancer in his father; Diabetes in his father and son; Diverticulitis  in his daughter; Heart attack in his father and son; Hepatitis C in his son; Hyperlipidemia in his brother; Hypertension in his father; Obesity in his son; Stroke in his mother.     Outpatient Encounter Medications as of 01/22/2017  Medication Sig  . amLODipine (NORVASC) 5 MG tablet Take by mouth.  Marland Kitchen aspirin 81 MG tablet Take 81 mg by mouth daily.   . cyanocobalamin 1000 MCG tablet Take 1,000 mcg by mouth daily.   . cyclobenzaprine (FLEXERIL) 10 MG tablet TAKE 1 TABLET BY MOUTH EVERY 8 HOURS AS NEEDED FOR MUSCLE SPASMS.  Marland Kitchen docusate sodium (COLACE) 100 MG capsule Take 1 capsule (100 mg total) by mouth 2 (two) times daily. (Patient taking differently: Take 100 mg by mouth daily as needed. )  . doxazosin (CARDURA) 8 MG tablet TAKE 1 TABLET BY MOUTH ONCE A DAY  . furosemide (LASIX) 20 MG tablet TAKE 1 TABLET BY MOUTH ONCE DAILY  . ibuprofen (ADVIL,MOTRIN) 800 MG tablet TAKE 1 TABLET BY MOUTH TWICE A DAY AS NEEDED  . Multiple Vitamin tablet Take 1 tablet by mouth daily.   . quinapril (ACCUPRIL) 20 MG tablet TAKE 1 TABLET BY MOUTH ONCE DAILY   No facility-administered encounter medications on file as of 01/22/2017.     Patient Care Team: Jerrol Banana., MD as PCP - General (Family Medicine) Bary Castilla, Forest Gleason, MD (General Surgery) Birder Robson, MD as Referring Physician (Ophthalmology) Corey Skains, MD as Consulting Physician (Cardiology)      Objective:   Vitals:  Vitals:   01/22/17 1033  BP: 126/72  Pulse: (!) 58  Resp: 12  Temp: (!) 97.2 F (36.2 C)  Weight: 284 lb 3.2 oz (128.9 kg)    Physical Exam  Constitutional: He is oriented to person, place, and time. He appears well-developed and well-nourished.  HENT:  Head: Normocephalic and atraumatic.  Right Ear: External ear normal.  Left Ear: External ear normal.  Nose: Nose normal.  Mouth/Throat: Oropharynx is clear and moist.  Eyes: Conjunctivae are normal. No scleral icterus.  Neck: No thyromegaly present.   Cardiovascular: Normal rate, regular rhythm, normal heart sounds and intact distal pulses.  Pulmonary/Chest: Effort normal and breath sounds normal.  Abdominal: Soft.  Genitourinary: Penis normal.  Lymphadenopathy:    He has no cervical adenopathy.  Neurological: He is alert and oriented to person, place, and time.  Skin: Skin is warm and dry.  Psychiatric: He has a normal mood and affect. His behavior is normal. Judgment and thought content normal.     Depression Screen PHQ 2/9 Scores 07/10/2016 11/08/2015 11/01/2014  PHQ - 2 Score 1 0 0   Assessment & Plan:   1. Annual physical exam - CBC with Differential/Platelet - Comprehensive metabolic panel - TSH - Lipid Panel With LDL/HDL Ratio  2. Essential hypertension - CBC with Differential/Platelet - Comprehensive metabolic panel  3. Hyperglycemia - Comprehensive metabolic panel - HgB W7P  4. BMI 39.0-39.9,adult - TSH - Lipid Panel With LDL/HDL Ratio  5. Colon cancer screening - IFOBT POC (occult bld, rslt in office)  HPI, Exam and A&P transcribed by Tiffany Kocher, RMA under direction and in the presence of Miguel Aschoff, MD.

## 2017-01-23 LAB — CBC WITH DIFFERENTIAL/PLATELET
BASOS ABS: 0.1 10*3/uL (ref 0.0–0.2)
Basos: 1 %
EOS (ABSOLUTE): 0.1 10*3/uL (ref 0.0–0.4)
Eos: 2 %
HEMOGLOBIN: 14.6 g/dL (ref 13.0–17.7)
Hematocrit: 41.6 % (ref 37.5–51.0)
Immature Grans (Abs): 0 10*3/uL (ref 0.0–0.1)
Immature Granulocytes: 0 %
LYMPHS ABS: 1.8 10*3/uL (ref 0.7–3.1)
Lymphs: 27 %
MCH: 32.3 pg (ref 26.6–33.0)
MCHC: 35.1 g/dL (ref 31.5–35.7)
MCV: 92 fL (ref 79–97)
MONOCYTES: 9 %
MONOS ABS: 0.6 10*3/uL (ref 0.1–0.9)
NEUTROS ABS: 4 10*3/uL (ref 1.4–7.0)
Neutrophils: 61 %
Platelets: 264 10*3/uL (ref 150–379)
RBC: 4.52 x10E6/uL (ref 4.14–5.80)
RDW: 13.7 % (ref 12.3–15.4)
WBC: 6.6 10*3/uL (ref 3.4–10.8)

## 2017-01-23 LAB — LIPID PANEL WITH LDL/HDL RATIO
Cholesterol, Total: 161 mg/dL (ref 100–199)
HDL: 47 mg/dL (ref >39)
LDL CALC: 100 mg/dL — AB (ref 0–99)
LDL/HDL RATIO: 2.1 ratio (ref 0.0–3.6)
Triglycerides: 69 mg/dL (ref 0–149)
VLDL Cholesterol Cal: 14 mg/dL (ref 5–40)

## 2017-01-23 LAB — COMPREHENSIVE METABOLIC PANEL
ALBUMIN: 4.4 g/dL (ref 3.5–4.8)
ALK PHOS: 87 IU/L (ref 39–117)
ALT: 22 IU/L (ref 0–44)
AST: 22 IU/L (ref 0–40)
Albumin/Globulin Ratio: 1.7 (ref 1.2–2.2)
BILIRUBIN TOTAL: 0.8 mg/dL (ref 0.0–1.2)
BUN / CREAT RATIO: 14 (ref 10–24)
BUN: 14 mg/dL (ref 8–27)
CHLORIDE: 101 mmol/L (ref 96–106)
CO2: 24 mmol/L (ref 20–29)
Calcium: 9.1 mg/dL (ref 8.6–10.2)
Creatinine, Ser: 0.99 mg/dL (ref 0.76–1.27)
GFR calc Af Amer: 86 mL/min/{1.73_m2} (ref >59)
GFR calc non Af Amer: 74 mL/min/{1.73_m2} (ref >59)
GLOBULIN, TOTAL: 2.6 g/dL (ref 1.5–4.5)
Glucose: 101 mg/dL — ABNORMAL HIGH (ref 65–99)
Potassium: 4.5 mmol/L (ref 3.5–5.2)
SODIUM: 141 mmol/L (ref 134–144)
Total Protein: 7 g/dL (ref 6.0–8.5)

## 2017-01-23 LAB — TSH: TSH: 5.84 u[IU]/mL — AB (ref 0.450–4.500)

## 2017-01-23 LAB — HEMOGLOBIN A1C
Est. average glucose Bld gHb Est-mCnc: 111 mg/dL
Hgb A1c MFr Bld: 5.5 % (ref 4.8–5.6)

## 2017-01-27 ENCOUNTER — Telehealth: Payer: Self-pay

## 2017-01-27 NOTE — Telephone Encounter (Signed)
Pt advised.   Thanks,   -Fitzpatrick Alberico  

## 2017-01-27 NOTE — Telephone Encounter (Signed)
-----   Message from Jerrol Banana., MD sent at 01/27/2017  8:20 AM EST ----- All stable.

## 2017-02-11 DIAGNOSIS — M5136 Other intervertebral disc degeneration, lumbar region: Secondary | ICD-10-CM | POA: Diagnosis not present

## 2017-04-02 DIAGNOSIS — G4733 Obstructive sleep apnea (adult) (pediatric): Secondary | ICD-10-CM | POA: Diagnosis not present

## 2017-04-02 DIAGNOSIS — R635 Abnormal weight gain: Secondary | ICD-10-CM | POA: Diagnosis not present

## 2017-04-02 DIAGNOSIS — I10 Essential (primary) hypertension: Secondary | ICD-10-CM | POA: Diagnosis not present

## 2017-04-02 DIAGNOSIS — R072 Precordial pain: Secondary | ICD-10-CM | POA: Diagnosis not present

## 2017-04-21 DIAGNOSIS — R072 Precordial pain: Secondary | ICD-10-CM | POA: Diagnosis not present

## 2017-04-23 DIAGNOSIS — R0602 Shortness of breath: Secondary | ICD-10-CM | POA: Insufficient documentation

## 2017-04-23 DIAGNOSIS — I493 Ventricular premature depolarization: Secondary | ICD-10-CM | POA: Diagnosis not present

## 2017-04-23 DIAGNOSIS — I10 Essential (primary) hypertension: Secondary | ICD-10-CM | POA: Diagnosis not present

## 2017-04-23 DIAGNOSIS — E782 Mixed hyperlipidemia: Secondary | ICD-10-CM | POA: Diagnosis not present

## 2017-05-08 ENCOUNTER — Other Ambulatory Visit: Payer: Self-pay | Admitting: Family Medicine

## 2017-06-06 DIAGNOSIS — Z85828 Personal history of other malignant neoplasm of skin: Secondary | ICD-10-CM | POA: Diagnosis not present

## 2017-06-06 DIAGNOSIS — X32XXXA Exposure to sunlight, initial encounter: Secondary | ICD-10-CM | POA: Diagnosis not present

## 2017-06-06 DIAGNOSIS — D225 Melanocytic nevi of trunk: Secondary | ICD-10-CM | POA: Diagnosis not present

## 2017-06-06 DIAGNOSIS — D2262 Melanocytic nevi of left upper limb, including shoulder: Secondary | ICD-10-CM | POA: Diagnosis not present

## 2017-06-06 DIAGNOSIS — D2261 Melanocytic nevi of right upper limb, including shoulder: Secondary | ICD-10-CM | POA: Diagnosis not present

## 2017-06-06 DIAGNOSIS — L570 Actinic keratosis: Secondary | ICD-10-CM | POA: Diagnosis not present

## 2017-06-06 DIAGNOSIS — D2272 Melanocytic nevi of left lower limb, including hip: Secondary | ICD-10-CM | POA: Diagnosis not present

## 2017-06-06 DIAGNOSIS — D2271 Melanocytic nevi of right lower limb, including hip: Secondary | ICD-10-CM | POA: Diagnosis not present

## 2017-06-06 DIAGNOSIS — Z08 Encounter for follow-up examination after completed treatment for malignant neoplasm: Secondary | ICD-10-CM | POA: Diagnosis not present

## 2017-06-09 ENCOUNTER — Telehealth: Payer: Self-pay | Admitting: Emergency Medicine

## 2017-06-09 ENCOUNTER — Ambulatory Visit (INDEPENDENT_AMBULATORY_CARE_PROVIDER_SITE_OTHER): Payer: PPO | Admitting: Family Medicine

## 2017-06-09 ENCOUNTER — Encounter: Payer: Self-pay | Admitting: Family Medicine

## 2017-06-09 ENCOUNTER — Other Ambulatory Visit: Payer: Self-pay | Admitting: Family Medicine

## 2017-06-09 VITALS — BP 132/78 | HR 60 | Temp 98.2°F | Resp 16 | Ht 71.0 in | Wt 286.0 lb

## 2017-06-09 DIAGNOSIS — K219 Gastro-esophageal reflux disease without esophagitis: Secondary | ICD-10-CM

## 2017-06-09 NOTE — Progress Notes (Signed)
  Subjective:     Patient ID: Patrick Thornton, male   DOB: [birth year], 77 y.o.   MRN: [id number] Chief Complaint  Patient presents with  . Cough  . Shortness of Breath   HPI  States over the last week he has developed a dry cough along with the feeling of shortness of breath. Reports cough is worse lying down then persists during the day. No cold sx, fever, or chills. Hx of reflux and ACE use. Recent cardiology visit and felt to be doing well.   Review of Systems     Objective:   Physical Exam  Constitutional: He appears well-developed and well-nourished. No distress.  Cardiovascular: Normal rate and regular rhythm.  Pulmonary/Chest: Breath sounds normal.       Assessment:    1. Gastroesophageal reflux disease without esophagitis: Start PPI     Plan:    Discussed HOB elevation and taking otc Prevacid 15 mg two pills daily.

## 2017-06-09 NOTE — Telephone Encounter (Signed)
Pt called stating that he had been having chest pressure in the right side when he takes a deep breath for about a week. Shortness of breath, dizziness occasionally, cough and malaise. He was triaged he is on the schedule for 9:40 this morning.

## 2017-06-09 NOTE — Patient Instructions (Signed)
Start prevacid 15 mg two pills daily. Put the legs of the head of your bed on 6 inch blocks to help with reflux at night.

## 2017-07-22 ENCOUNTER — Ambulatory Visit (INDEPENDENT_AMBULATORY_CARE_PROVIDER_SITE_OTHER): Payer: PPO | Admitting: Family Medicine

## 2017-07-22 VITALS — BP 118/64 | HR 64 | Temp 98.5°F | Resp 16 | Wt 287.0 lb

## 2017-07-22 DIAGNOSIS — E039 Hypothyroidism, unspecified: Secondary | ICD-10-CM | POA: Diagnosis not present

## 2017-07-22 DIAGNOSIS — G4733 Obstructive sleep apnea (adult) (pediatric): Secondary | ICD-10-CM

## 2017-07-22 DIAGNOSIS — Z6841 Body Mass Index (BMI) 40.0 and over, adult: Secondary | ICD-10-CM | POA: Diagnosis not present

## 2017-07-22 DIAGNOSIS — E66813 Obesity, class 3: Secondary | ICD-10-CM

## 2017-07-22 DIAGNOSIS — I10 Essential (primary) hypertension: Secondary | ICD-10-CM | POA: Diagnosis not present

## 2017-07-22 NOTE — Progress Notes (Signed)
Patrick Thornton  MRN: [id number] DOB: Oct 23, 1940  Subjective:  HPI   The patient is a 77 year old male who presents for follow up of chronic health.  He was last seen on 06/09/17 for an acute visit with Mikki Santee.    Hypertension-He checks his blood pressure at home on occasion and gets reading about 120s over 70s.  Hyperglycemia-the patient states he has not had a problem with his glucose in the past.  However he has had some borderline glucose readings but his A1C has been in the normal range.  Last done on 01/22/17 and it was 5.5.  Patient Active Problem List   Diagnosis Date Noted  . OSA (obstructive sleep apnea) 10/08/2016  . Balanoposthitis 12/01/2015  . Benign fibroma of prostate 11/01/2014  . ED (erectile dysfunction) of organic origin 11/01/2014  . Acid reflux 11/01/2014  . HLD (hyperlipidemia) 11/01/2014  . BP (high blood pressure) 11/01/2014  . Adiposity 11/01/2014  . Arthritis, degenerative 11/01/2014  . Cannot sleep 11/01/2014  . MI (mitral incompetence) 11/29/2013  . Personal history of colonic polyps 03/25/2013    Past Medical History:  Diagnosis Date  . Arthritis   . Collar bone fracture    x 2  . Colon polyp 2003  . Dysrhythmia    occasionally skips a beat and has bradycardia(in the30's today)  . Edema    legs/feet occas  . History of hiatal hernia   . Hypertension   . Sleep apnea    20 years ago and did not wear cpap long    Social History   Socioeconomic History  . Marital status: Married    Spouse name: Sunday Spillers  . Number of children: 4  . Years of education: Not on file  . Highest education level: Not on file  Occupational History  . Occupation: retired  Scientific laboratory technician  . Financial resource strain: Not on file  . Food insecurity:    Worry: Not on file    Inability: Not on file  . Transportation needs:    Medical: Not on file    Non-medical: Not on file  Tobacco Use  . Smoking status: Former Smoker    Years: 40.00    Last attempt to quit:  01/04/1994    Years since quitting: 23.5  . Smokeless tobacco: Never Used  Substance and Sexual Activity  . Alcohol use: No  . Drug use: No  . Sexual activity: Never  Lifestyle  . Physical activity:    Days per week: Not on file    Minutes per session: Not on file  . Stress: Not on file  Relationships  . Social connections:    Talks on phone: Not on file    Gets together: Not on file    Attends religious service: Not on file    Active member of club or organization: Not on file    Attends meetings of clubs or organizations: Not on file    Relationship status: Not on file  . Intimate partner violence:    Fear of current or ex partner: Not on file    Emotionally abused: Not on file    Physically abused: Not on file    Forced sexual activity: Not on file  Other Topics Concern  . Not on file  Social History Narrative  . Not on file    Outpatient Encounter Medications as of 07/22/2017  Medication Sig  . amLODipine (NORVASC) 5 MG tablet Take by mouth.  . cyanocobalamin 1000 MCG tablet  Take 1,000 mcg by mouth daily.   . cyclobenzaprine (FLEXERIL) 10 MG tablet TAKE 1 TABLET BY MOUTH EVERY 8 HOURS AS NEEDED FOR MUSCLE SPASMS.  Marland Kitchen docusate sodium (COLACE) 100 MG capsule Take 1 capsule (100 mg total) by mouth 2 (two) times daily. (Patient taking differently: Take 100 mg by mouth daily as needed. )  . doxazosin (CARDURA) 8 MG tablet TAKE 1 TABLET BY MOUTH ONCE DAILY  . furosemide (LASIX) 20 MG tablet TAKE 1 TABLET BY MOUTH ONCE DAILY  . ibuprofen (ADVIL,MOTRIN) 800 MG tablet TAKE 1 TABLET BY MOUTH TWICE A DAY AS NEEDED  . Multiple Vitamin tablet Take 1 tablet by mouth daily.   . quinapril (ACCUPRIL) 20 MG tablet TAKE 1 TABLET BY MOUTH ONCE A DAY   No facility-administered encounter medications on file as of 07/22/2017.     Allergies  Allergen Reactions  . Tape Rash    Blisters; skin tore off when patient took off electrodes from wearing a holter monitor     Review of Systems    Constitutional: Negative for fever and malaise/fatigue.  Eyes: Negative.   Respiratory: Negative for cough, shortness of breath and wheezing.   Cardiovascular: Positive for leg swelling (ankle swelling). Negative for chest pain, palpitations and orthopnea.  Gastrointestinal: Negative.   Genitourinary: Negative.   Musculoskeletal: Positive for joint pain.  Skin: Negative.   Neurological: Negative.   Endo/Heme/Allergies: Negative.   Psychiatric/Behavioral: Negative.     Objective:  BP 118/64 (BP Location: Right Arm, Patient Position: Sitting, Cuff Size: Normal)   Pulse 64   Temp 98.5 F (36.9 C) (Oral)   Resp 16   Wt 287 lb (130.2 kg)   BMI 40.03 kg/m   Physical Exam  Constitutional: He is oriented to person, place, and time and well-developed, well-nourished, and in no distress.  HENT:  Head: Normocephalic and atraumatic.  Right Ear: External ear normal.  Left Ear: External ear normal.  Nose: Nose normal.  Eyes: Conjunctivae are normal. No scleral icterus.  Neck: No thyromegaly present.  Cardiovascular: Normal rate, regular rhythm and normal heart sounds.  Pulmonary/Chest: Effort normal and breath sounds normal.  Abdominal: Soft.  Neurological: He is alert and oriented to person, place, and time. Gait normal. GCS score is 15.  Skin: Skin is warm and dry.  Psychiatric: Mood, memory, affect and judgment normal.    Assessment and Plan :  1. Hypothyroidism, unspecified type  - TSH 2.HTN 3.BPH 4.Obesity with HTN/OA  I have done the exam and reviewed the chart and it is accurate to the best of my knowledge. Development worker, community has been used and  any errors in dictation or transcription are unintentional. Miguel Aschoff M.D. [facility]

## 2017-07-23 LAB — TSH: TSH: 5.15 u[IU]/mL — ABNORMAL HIGH (ref 0.450–4.500)

## 2017-09-16 ENCOUNTER — Other Ambulatory Visit: Payer: Self-pay | Admitting: Family Medicine

## 2017-10-28 DIAGNOSIS — R600 Localized edema: Secondary | ICD-10-CM | POA: Diagnosis not present

## 2017-10-28 DIAGNOSIS — I10 Essential (primary) hypertension: Secondary | ICD-10-CM | POA: Diagnosis not present

## 2017-10-28 DIAGNOSIS — I493 Ventricular premature depolarization: Secondary | ICD-10-CM | POA: Diagnosis not present

## 2017-11-01 ENCOUNTER — Ambulatory Visit: Payer: Self-pay

## 2017-11-25 ENCOUNTER — Ambulatory Visit: Payer: PPO

## 2017-12-01 DIAGNOSIS — H2513 Age-related nuclear cataract, bilateral: Secondary | ICD-10-CM | POA: Diagnosis not present

## 2018-01-27 ENCOUNTER — Ambulatory Visit (INDEPENDENT_AMBULATORY_CARE_PROVIDER_SITE_OTHER): Payer: PPO

## 2018-01-27 ENCOUNTER — Ambulatory Visit (INDEPENDENT_AMBULATORY_CARE_PROVIDER_SITE_OTHER): Payer: PPO | Admitting: Family Medicine

## 2018-01-27 VITALS — BP 130/68 | HR 80 | Temp 98.8°F | Ht 71.0 in | Wt 289.0 lb

## 2018-01-27 VITALS — BP 130/68 | HR 80 | Temp 98.8°F | Resp 16 | Ht 71.0 in | Wt 289.0 lb

## 2018-01-27 DIAGNOSIS — E66813 Obesity, class 3: Secondary | ICD-10-CM

## 2018-01-27 DIAGNOSIS — Z6841 Body Mass Index (BMI) 40.0 and over, adult: Secondary | ICD-10-CM

## 2018-01-27 DIAGNOSIS — E785 Hyperlipidemia, unspecified: Secondary | ICD-10-CM

## 2018-01-27 DIAGNOSIS — G4733 Obstructive sleep apnea (adult) (pediatric): Secondary | ICD-10-CM

## 2018-01-27 DIAGNOSIS — Z0000 Encounter for general adult medical examination without abnormal findings: Secondary | ICD-10-CM

## 2018-01-27 DIAGNOSIS — I10 Essential (primary) hypertension: Secondary | ICD-10-CM

## 2018-01-27 NOTE — Progress Notes (Signed)
Subjective:   Patrick Thornton is a 78 y.o. male who presents for Medicare Annual/Subsequent preventive examination.  Review of Systems:  N/A  Cardiac Risk Factors include: advanced age (>74men, >79 women);hypertension;male gender;obesity (BMI >30kg/m2)     Objective:    Vitals: BP 130/68 (BP Location: Right Arm)   Pulse 80   Temp 98.8 F (37.1 C) (Oral)   Ht 5\' 11"  (1.803 m)   Wt 289 lb (131.1 kg)   BMI 40.31 kg/m   Body mass index is 40.31 kg/m.  Advanced Directives 01/27/2018 07/10/2016 01/09/2016 01/05/2016 11/08/2015 04/30/2015 02/20/2015  Does Patient Have a Medical Advance Directive? Yes Yes Yes Yes Yes Yes Yes  Type of Paramedic of [place];Living will [place];Living will [place];Living will - Living will;Healthcare Power of [place];Living will -  Does patient want to make changes to medical advance directive? - - - No - Patient declined - - -  Copy of [place] in Chart? No - copy requested Yes No - copy requested - - - -    Tobacco Social History   Tobacco Use  Smoking Status Former Smoker  . Years: 40.00  . Last attempt to quit: 01/04/1994  . Years since quitting: 24.0  Smokeless Tobacco Never Used     Counseling given: Not Answered   Clinical Intake:     Pain : No/denies pain Pain Score: 0-No pain     Nutritional Status: BMI > 30  Obese Nutritional Risks: None Diabetes: No  How often do you need to have someone help you when you read instructions, pamphlets, or other written materials from your doctor or pharmacy?: 1 - Never  Interpreter Needed?: No  Information entered by :: [facility], LPN  Past Medical History:  Diagnosis Date  . Arthritis   . Collar bone fracture    x 2  . Colon polyp 2003  . Dysrhythmia    occasionally skips a beat and has bradycardia(in the30's today)  . Edema    legs/feet occas  . History of hiatal  hernia   . Hypertension   . Sleep apnea    20 years ago and did not wear cpap long   Past Surgical History:  Procedure Laterality Date  . BUNIONECTOMY WITH HAMMERTOE RECONSTRUCTION Left 2011  . CHOLECYSTECTOMY  1970  . CIRCUMCISION N/A 01/09/2016   Procedure: CIRCUMCISION ADULT;  Surgeon: Hollice Espy, MD;  Location: [facility] ORS;  Service: Urology;  Laterality: N/A;  . COLONOSCOPY  03-22-08   Dr Bary Castilla  . FOOT SURGERY Left    x 2 pins  . FRACTURE SURGERY Left 1970   pins in left big toe and first toe  . HERNIA REPAIR  1970  . OTHER SURGICAL HISTORY     transanal excision of a villous adenoma   Family History  Problem Relation Age of Onset  . Stroke Mother   . Cancer Father        melanoma  . Diabetes Father   . Hypertension Father   . Heart attack Father   . Hyperlipidemia Brother   . Diabetes Son   . COPD Son   . Obesity Son   . Heart attack Son   . Hepatitis C Son   . CAD Brother   . Diverticulitis Daughter    Social History   Socioeconomic History  . Marital status: Married    Spouse name: Sunday Spillers  . Number of children: 4  .  Years of education: Not on file  . Highest education level: Some college, no degree  Occupational History  . Occupation: retired  Scientific laboratory technician  . Financial resource strain: Not hard at all  . Food insecurity:    Worry: Never true    Inability: Never true  . Transportation needs:    Medical: No    Non-medical: No  Tobacco Use  . Smoking status: Former Smoker    Years: 40.00    Last attempt to quit: 01/04/1994    Years since quitting: 24.0  . Smokeless tobacco: Never Used  Substance and Sexual Activity  . Alcohol use: No  . Drug use: No  . Sexual activity: Never  Lifestyle  . Physical activity:    Days per week: 0 days    Minutes per session: 0 min  . Stress: Only a little  Relationships  . Social connections:    Talks on phone: Patient refused    Gets together: Patient refused    Attends religious service: Patient refused      Active member of club or organization: Patient refused    Attends meetings of clubs or organizations: Patient refused    Relationship status: Patient refused  Other Topics Concern  . Not on file  Social History Narrative  . Not on file    Outpatient Encounter Medications as of 01/27/2018  Medication Sig  . amLODipine (NORVASC) 5 MG tablet Take 5 mg by mouth daily.   Marland Kitchen doxazosin (CARDURA) 8 MG tablet TAKE 1 TABLET BY MOUTH ONCE DAILY  . furosemide (LASIX) 20 MG tablet TAKE 1 TABLET BY MOUTH ONCE A DAY (Patient taking differently: Take 10 mg by mouth daily. )  . Multiple Vitamin tablet Take 1 tablet by mouth daily.   . quinapril (ACCUPRIL) 20 MG tablet TAKE 1 TABLET BY MOUTH ONCE A DAY  . cyanocobalamin 1000 MCG tablet Take 1,000 mcg by mouth daily.   . cyclobenzaprine (FLEXERIL) 10 MG tablet TAKE 1 TABLET BY MOUTH EVERY 8 HOURS AS NEEDED FOR MUSCLE SPASMS. (Patient not taking: Reported on 01/27/2018)  . docusate sodium (COLACE) 100 MG capsule Take 1 capsule (100 mg total) by mouth 2 (two) times daily. (Patient not taking: Reported on 01/27/2018)  . ibuprofen (ADVIL,MOTRIN) 800 MG tablet TAKE 1 TABLET BY MOUTH TWICE A DAY AS NEEDED (Patient not taking: Reported on 01/27/2018)   No facility-administered encounter medications on file as of 01/27/2018.     Activities of Daily Living In your present state of health, do you have any difficulty performing the following activities: 01/27/2018 07/22/2017  Hearing? Y Y  Comment Has tinnitus. Does not wear hearing aids.  -  Vision? N N  Difficulty concentrating or making decisions? N N  Walking or climbing stairs? N Y  Dressing or bathing? N N  Doing errands, shopping? N N  Preparing Food and eating ? N -  Using the Toilet? N -  In the past six months, have you accidently leaked urine? N -  Do you have problems with loss of bowel control? N -  Managing your Medications? N -  Managing your Finances? N -  Housekeeping or managing your Housekeeping? N  -  Some recent data might be hidden    Patient Care Team: Jerrol Banana., MD as PCP - General (Family Medicine) Bary Castilla, Forest Gleason, MD (General Surgery) Birder Robson, MD as Referring Physician (Ophthalmology) Corey Skains, MD as Consulting Physician (Cardiology) Dasher, Rayvon Char, MD (Dermatology)   Assessment:  This is a routine wellness examination for Patrick Thornton.  Exercise Activities and Dietary recommendations Current Exercise Habits: The patient does not participate in regular exercise at present, Exercise limited by: None identified  Goals    . Exercise 3x per week (30 min per time)     Recommend increasing exercise. Pt to start walking 3 times a week for 30 minutes.        Fall Risk Fall Risk  01/27/2018 07/22/2017 07/10/2016 11/08/2015 11/01/2014  Falls in the past year? 0 No No Yes No  Number falls in past yr: 0 - - 1 -  Injury with Fall? 0 - - Yes -   FALL RISK PREVENTION PERTAINING TO THE HOME:  Any stairs in or around the home WITH handrails? No  Home free of loose throw rugs in walkways, pet beds, electrical cords, etc? Yes  Adequate lighting in your home to reduce risk of falls? Yes   ASSISTIVE DEVICES UTILIZED TO PREVENT FALLS:  Life alert? No  Use of a cane, walker or w/c? No  Grab bars in the bathroom? Yes  Shower chair or bench in shower? Yes  Elevated toilet seat or a handicapped toilet? No    TIMED UP AND GO:  Was the test performed? No .     Depression Screen PHQ 2/9 Scores 01/27/2018 07/22/2017 07/10/2016 11/08/2015  PHQ - 2 Score 1 0 1 0    Cognitive Function: Declined today.      6CIT Screen 07/10/2016  What Year? 0 points  What month? 0 points  What time? 0 points  Count back from 20 0 points  Months in reverse 0 points  Repeat phrase 0 points  Total Score 0    Immunization History  Administered Date(s) Administered  . Influenza Inj Mdck Quad Pf 12/09/2016  . Influenza, High Dose Seasonal PF 11/01/2014, 10/10/2015  .  Influenza-Unspecified 10/21/2013  . Pneumococcal Conjugate-13 10/27/2013  . Pneumococcal Polysaccharide-23 07/30/2011    Qualifies for Shingles Vaccine? Yes . Due for Shingrix. Education has been provided regarding the importance of this vaccine. Pt has been advised to call insurance company to determine out of pocket expense. Advised may also receive vaccine at local pharmacy or Health Dept. Verbalized acceptance and understanding.  Tdap: Although this vaccine is not a covered service during a Wellness Exam, does the patient still wish to receive this vaccine today?  No .  Education has been provided regarding the importance of this vaccine. Advised may receive this vaccine at local pharmacy or Health Dept. Aware to provide a copy of the vaccination record if obtained from local pharmacy or Health Dept. Verbalized acceptance and understanding.  Flu Vaccine: Up to date  Pneumococcal Vaccine: Up to date   Screening Tests Health Maintenance  Topic Date Due  . TETANUS/TDAP  01/21/2026 (Originally 10/10/1959)  . INFLUENZA VACCINE  Completed  . PNA vac Low Risk Adult  Completed   Cancer Screenings:  Colorectal Screening: No longer required.   Lung Cancer Screening: (Low Dose CT Chest recommended if Age 74-80 years, 30 pack-year currently smoking OR have quit w/in 15years.) does not qualify.    Additional Screening:  Vision Screening: Recommended annual ophthalmology exams for early detection of glaucoma and other disorders of the eye.  Dental Screening: Recommended annual dental exams for proper oral hygiene  Community Resource Referral:  CRR required this visit?  No        Plan:  I have personally reviewed and addressed the Medicare Annual Wellness questionnaire and  have noted the following in the patient's chart:  A. Medical and social history B. Use of alcohol, tobacco or illicit drugs  C. Current medications and supplements D. Functional ability and status E.  Nutritional  status F.  Physical activity G. Advance directives H. List of other physicians I.  Hospitalizations, surgeries, and ER visits in previous 12 months J.  [place] such as hearing and vision if needed, cognitive and depression L. Referrals and appointments - none  In addition, I have reviewed and discussed with patient certain preventive protocols, quality metrics, and best practice recommendations. A written personalized care plan for preventive services as well as general preventive health recommendations were provided to patient.  See attached scanned questionnaire for additional information.   Signed,  Fabio Neighbors, LPN Nurse Health Advisor   Nurse Recommendations: Pt declined the tetanus vaccine today.

## 2018-01-27 NOTE — Patient Instructions (Addendum)
Patrick Thornton , Thank you for taking time to come for your Medicare Wellness Visit. I appreciate your ongoing commitment to your health goals. Please review the following plan we discussed and let me know if I can assist you in the future.   Screening recommendations/referrals: Colonoscopy: No longer required.  Recommended yearly ophthalmology/optometry visit for glaucoma screening and checkup Recommended yearly dental visit for hygiene and checkup  Vaccinations: Influenza vaccine: Up to date Pneumococcal vaccine: Completed series Tdap vaccine: Pt declines today.  Shingles vaccine: Pt declines today.     Advanced directives: Please bring a copy of your POA (Power of Attorney) and/or Living Will to your next appointment.   Conditions/risks identified: Obesity- Continue trying to increase exercise. Recommend to walk 3 days a week for at least 30 minutes at a time and increase as able.   Next appointment: 9:40 AM today with Dr Rosanna Randy.   Preventive Care 65 Years and Older, Male Preventive care refers to lifestyle choices and visits with your health care provider that can promote health and wellness. What does preventive care include?  A yearly physical exam. This is also called an annual well check.  Dental exams once or twice a year.  Routine eye exams. Ask your health care provider how often you should have your eyes checked.  Personal lifestyle choices, including:  Daily care of your teeth and gums.  Regular physical activity.  Eating a healthy diet.  Avoiding tobacco and drug use.  Limiting alcohol use.  Practicing safe sex.  Taking low doses of aspirin every day.  Taking vitamin and mineral supplements as recommended by your health care provider. What happens during an annual well check? The services and screenings done by your health care provider during your annual well check will depend on your age, overall health, lifestyle risk factors, and family history of  disease. Counseling  Your health care provider may ask you questions about your:  Alcohol use.  Tobacco use.  Drug use.  Emotional well-being.  Home and relationship well-being.  Sexual activity.  Eating habits.  History of falls.  Memory and ability to understand (cognition).  Work and work Statistician. Screening  You may have the following tests or measurements:  Height, weight, and BMI.  Blood pressure.  Lipid and cholesterol levels. These may be checked every 5 years, or more frequently if you are over 53 years old.  Skin check.  Lung cancer screening. You may have this screening every year starting at age 46 if you have a 30-pack-year history of smoking and currently smoke or have quit within the past 15 years.  Fecal occult blood test (FOBT) of the stool. You may have this test every year starting at age 19.  Flexible sigmoidoscopy or colonoscopy. You may have a sigmoidoscopy every 5 years or a colonoscopy every 10 years starting at age 6.  Prostate cancer screening. Recommendations will vary depending on your family history and other risks.  Hepatitis C blood test.  Hepatitis B blood test.  Sexually transmitted disease (STD) testing.  Diabetes screening. This is done by checking your blood sugar (glucose) after you have not eaten for a while (fasting). You may have this done every 1-3 years.  Abdominal aortic aneurysm (AAA) screening. You may need this if you are a current or former smoker.  Osteoporosis. You may be screened starting at age 24 if you are at high risk. Talk with your health care provider about your test results, treatment options, and if necessary, the  need for more tests. Vaccines  Your health care provider may recommend certain vaccines, such as:  Influenza vaccine. This is recommended every year.  Tetanus, diphtheria, and acellular pertussis (Tdap, Td) vaccine. You may need a Td booster every 10 years.  Zoster vaccine. You may  need this after age 62.  Pneumococcal 13-valent conjugate (PCV13) vaccine. One dose is recommended after age 23.  Pneumococcal polysaccharide (PPSV23) vaccine. One dose is recommended after age 51. Talk to your health care provider about which screenings and vaccines you need and how often you need them. This information is not intended to replace advice given to you by your health care provider. Make sure you discuss any questions you have with your health care provider. Document Released: 02/03/2015 Document Revised: 09/27/2015 Document Reviewed: 11/08/2014 Elsevier Interactive Patient Education  2017 [place] Prevention in the Home Falls can cause injuries. They can happen to people of all ages. There are many things you can do to make your home safe and to help prevent falls. What can I do on the outside of my home?  Regularly fix the edges of walkways and driveways and fix any cracks.  Remove anything that might make you trip as you walk through a door, such as a raised step or threshold.  Trim any bushes or trees on the path to your home.  Use bright outdoor lighting.  Clear any walking paths of anything that might make someone trip, such as rocks or tools.  Regularly check to see if handrails are loose or broken. Make sure that both sides of any steps have handrails.  Any raised decks and porches should have guardrails on the edges.  Have any leaves, snow, or ice cleared regularly.  Use sand or salt on walking paths during winter.  Clean up any spills in your garage right away. This includes oil or grease spills. What can I do in the bathroom?  Use night lights.  Install grab bars by the toilet and in the tub and shower. Do not use towel bars as grab bars.  Use non-skid mats or decals in the tub or shower.  If you need to sit down in the shower, use a plastic, non-slip stool.  Keep the floor dry. Clean up any water that spills on the floor as soon as it  happens.  Remove soap buildup in the tub or shower regularly.  Attach bath mats securely with double-sided non-slip rug tape.  Do not have throw rugs and other things on the floor that can make you trip. What can I do in the bedroom?  Use night lights.  Make sure that you have a light by your bed that is easy to reach.  Do not use any sheets or blankets that are too big for your bed. They should not hang down onto the floor.  Have a firm chair that has side arms. You can use this for support while you get dressed.  Do not have throw rugs and other things on the floor that can make you trip. What can I do in the kitchen?  Clean up any spills right away.  Avoid walking on wet floors.  Keep items that you use a lot in easy-to-reach places.  If you need to reach something above you, use a strong step stool that has a grab bar.  Keep electrical cords out of the way.  Do not use floor polish or wax that makes floors slippery. If you must use wax,  use non-skid floor wax.  Do not have throw rugs and other things on the floor that can make you trip. What can I do with my stairs?  Do not leave any items on the stairs.  Make sure that there are handrails on both sides of the stairs and use them. Fix handrails that are broken or loose. Make sure that handrails are as long as the stairways.  Check any carpeting to make sure that it is firmly attached to the stairs. Fix any carpet that is loose or worn.  Avoid having throw rugs at the top or bottom of the stairs. If you do have throw rugs, attach them to the floor with carpet tape.  Make sure that you have a light switch at the top of the stairs and the bottom of the stairs. If you do not have them, ask someone to add them for you. What else can I do to help prevent falls?  Wear shoes that:  Do not have high heels.  Have rubber bottoms.  Are comfortable and fit you well.  Are closed at the toe. Do not wear sandals.  If you  use a stepladder:  Make sure that it is fully opened. Do not climb a closed stepladder.  Make sure that both sides of the stepladder are locked into place.  Ask someone to hold it for you, if possible.  Clearly mark and make sure that you can see:  Any grab bars or handrails.  First and last steps.  Where the edge of each step is.  Use tools that help you move around (mobility aids) if they are needed. These include:  Canes.  Walkers.  Scooters.  Crutches.  Turn on the lights when you go into a dark area. Replace any light bulbs as soon as they burn out.  Set up your furniture so you have a clear path. Avoid moving your furniture around.  If any of your floors are uneven, fix them.  If there are any pets around you, be aware of where they are.  Review your medicines with your doctor. Some medicines can make you feel dizzy. This can increase your chance of falling. Ask your doctor what other things that you can do to help prevent falls. This information is not intended to replace advice given to you by your health care provider. Make sure you discuss any questions you have with your health care provider. Document Released: 11/03/2008 Document Revised: 06/15/2015 Document Reviewed: 02/11/2014 Elsevier Interactive Patient Education  2017 [employer].

## 2018-01-27 NOTE — Progress Notes (Signed)
Patient: Patrick Thornton, Male    DOB: [birth year], 78 y.o.   MRN: [id number] Visit Date: 01/27/2018  Today's Provider: [name], MD   Chief Complaint  Patient presents with  . Annual Exam   Subjective:  Patrick Thornton is a 78 y.o. male who presents today for health maintenance and complete physical. He feels well. He reports exercising none. He reports he is sleeping well.  Immunization History  Administered Date(s) Administered  . Influenza Inj Mdck Quad Pf 12/09/2016  . Influenza, High Dose Seasonal PF 11/01/2014, 10/10/2015  . Influenza-Unspecified 10/21/2013  . Pneumococcal Conjugate-13 10/27/2013  . Pneumococcal Polysaccharide-23 07/30/2011   04/14/14 Colonoscopy, Byrnette-submucosal cyst, no follow up needed  Review of Systems  Constitutional: Negative.   HENT: Positive for hearing loss and tinnitus.   Eyes: Negative.   Respiratory: Negative.   Cardiovascular: Negative.   Gastrointestinal: Negative.   Endocrine: Negative.   Genitourinary: Negative.   Musculoskeletal: Negative.   Skin: Negative.   Allergic/Immunologic: Negative.   Neurological: Negative.   Hematological: Negative.   Psychiatric/Behavioral: Negative.     Social History   Socioeconomic History  . Marital status: Married    Spouse name: Sunday Spillers  . Number of children: 4  . Years of education: Not on file  . Highest education level: Some college, no degree  Occupational History  . Occupation: retired  Scientific laboratory technician  . Financial resource strain: Not hard at all  . Food insecurity:    Worry: Never true    Inability: Never true  . Transportation needs:    Medical: No    Non-medical: No  Tobacco Use  . Smoking status: Former Smoker    Years: 40.00    Last attempt to quit: 01/04/1994    Years since quitting: 24.0  . Smokeless tobacco: Never Used  Substance and Sexual Activity  . Alcohol use: No  . Drug use: No  . Sexual activity: Never  Lifestyle  . Physical activity:    Days per  week: 0 days    Minutes per session: 0 min  . Stress: Only a little  Relationships  . Social connections:    Talks on phone: Patient refused    Gets together: Patient refused    Attends religious service: Patient refused    Active member of club or organization: Patient refused    Attends meetings of clubs or organizations: Patient refused    Relationship status: Patient refused  . Intimate partner violence:    Fear of current or ex partner: Patient refused    Emotionally abused: Patient refused    Physically abused: Patient refused    Forced sexual activity: Patient refused  Other Topics Concern  . Not on file  Social History Narrative  . Not on file    Patient Active Problem List   Diagnosis Date Noted  . OSA (obstructive sleep apnea) 10/08/2016  . Balanoposthitis 12/01/2015  . Benign fibroma of prostate 11/01/2014  . ED (erectile dysfunction) of organic origin 11/01/2014  . Acid reflux 11/01/2014  . HLD (hyperlipidemia) 11/01/2014  . BP (high blood pressure) 11/01/2014  . Adiposity 11/01/2014  . Arthritis, degenerative 11/01/2014  . Cannot sleep 11/01/2014  . MI (mitral incompetence) 11/29/2013  . Personal history of colonic polyps 03/25/2013    Past Surgical History:  Procedure Laterality Date  . BUNIONECTOMY WITH HAMMERTOE RECONSTRUCTION Left 2011  . CHOLECYSTECTOMY  1970  . CIRCUMCISION N/A 01/09/2016   Procedure: CIRCUMCISION ADULT;  Surgeon: Hollice Espy, MD;  Location: [facility]  ORS;  Service: Urology;  Laterality: N/A;  . COLONOSCOPY  03-22-08   Dr Bary Castilla  . FOOT SURGERY Left    x 2 pins  . FRACTURE SURGERY Left 1970   pins in left big toe and first toe  . HERNIA REPAIR  1970  . OTHER SURGICAL HISTORY     transanal excision of a villous adenoma    His family history includes CAD in his brother; COPD in his son; Cancer in his father; Diabetes in his father and son; Diverticulitis in his daughter; Heart attack in his father and son; Hepatitis C in his son;  Hyperlipidemia in his brother; Hypertension in his father; Obesity in his son; Stroke in his mother.     Outpatient Encounter Medications as of 01/27/2018  Medication Sig  . amLODipine (NORVASC) 5 MG tablet Take 5 mg by mouth daily.   . cyanocobalamin 1000 MCG tablet Take 1,000 mcg by mouth daily.   . cyclobenzaprine (FLEXERIL) 10 MG tablet TAKE 1 TABLET BY MOUTH EVERY 8 HOURS AS NEEDED FOR MUSCLE SPASMS.  Marland Kitchen docusate sodium (COLACE) 100 MG capsule Take 1 capsule (100 mg total) by mouth 2 (two) times daily.  Marland Kitchen doxazosin (CARDURA) 8 MG tablet TAKE 1 TABLET BY MOUTH ONCE DAILY  . furosemide (LASIX) 20 MG tablet TAKE 1 TABLET BY MOUTH ONCE A DAY (Patient taking differently: Take 10 mg by mouth daily. )  . ibuprofen (ADVIL,MOTRIN) 800 MG tablet TAKE 1 TABLET BY MOUTH TWICE A DAY AS NEEDED  . Multiple Vitamin tablet Take 1 tablet by mouth daily.   . quinapril (ACCUPRIL) 20 MG tablet TAKE 1 TABLET BY MOUTH ONCE A DAY   No facility-administered encounter medications on file as of 01/27/2018.     Patient Care Team: Jerrol Banana., MD as PCP - General (Family Medicine) Bary Castilla, Forest Gleason, MD (General Surgery) Birder Robson, MD as Referring Physician (Ophthalmology) Corey Skains, MD as Consulting Physician (Cardiology) Dasher, Rayvon Char, MD (Dermatology)      Objective:   Vitals:  Vitals:   01/27/18 0929  BP: 130/68  Pulse: 80  Resp: 16  Temp: 98.8 F (37.1 C)  TempSrc: Oral  Weight: 289 lb (131.1 kg)  Height: 5\' 11"  (1.803 m)    Physical Exam Constitutional:      Appearance: Normal appearance. He is obese.  HENT:     Head: Normocephalic and atraumatic.     Right Ear: Tympanic membrane normal.     Left Ear: Tympanic membrane normal.     Nose: Nose normal.     Mouth/Throat:     Mouth: Mucous membranes are moist.     Pharynx: Oropharynx is clear.  Eyes:     Extraocular Movements: Extraocular movements intact.     Conjunctiva/sclera: Conjunctivae normal.     Pupils:  Pupils are equal, round, and reactive to light.  Neck:     Musculoskeletal: Normal range of motion and neck supple.  Cardiovascular:     Rate and Rhythm: Normal rate and regular rhythm.     Pulses: Normal pulses.     Heart sounds: Normal heart sounds.  Pulmonary:     Effort: Pulmonary effort is normal.     Breath sounds: Normal breath sounds.  Abdominal:     General: Abdomen is flat. Bowel sounds are normal.     Palpations: Abdomen is soft.  Musculoskeletal: Normal range of motion.  Skin:    General: Skin is warm and dry.  Neurological:  General: No focal deficit present.     Mental Status: He is alert and oriented to person, place, and time. Mental status is at baseline.  Psychiatric:        Mood and Affect: Mood normal.        Behavior: Behavior normal.        Thought Content: Thought content normal.        Judgment: Judgment normal.      Depression Screen PHQ 2/9 Scores 01/27/2018 07/22/2017 07/10/2016 11/08/2015  PHQ - 2 Score 1 0 1 0      Assessment & Plan:     Routine Health Maintenance and Physical Exam  Exercise Activities and Dietary recommendations Goals    . Exercise 3x per week (30 min per time)     Recommend increasing exercise. Pt to start walking 3 times a week for 30 minutes.        Immunization History  Administered Date(s) Administered  . Influenza Inj Mdck Quad Pf 12/09/2016  . Influenza, High Dose Seasonal PF 11/01/2014, 10/10/2015  . Influenza-Unspecified 10/21/2013  . Pneumococcal Conjugate-13 10/27/2013  . Pneumococcal Polysaccharide-23 07/30/2011    Health Maintenance  Topic Date Due  . TETANUS/TDAP  01/21/2026 (Originally 10/10/1960)  . INFLUENZA VACCINE  Completed  . PNA vac Low Risk Adult  Completed     Discussed health benefits of physical activity, and encouraged him to engage in regular exercise appropriate for his age and condition.      1. Annual physical exam   2. Essential hypertension Good control. - CBC with  Differential/Platelet - Comprehensive metabolic panel - TSH  3. Hyperlipidemia, unspecified hyperlipidemia type  - Lipid Panel With LDL/HDL Ratio  4. OSA (obstructive sleep apnea) Uses CPAP nightly.  5. Class 3 severe obesity due to excess calories with serious comorbidity and body mass index (BMI) of 40.0 to 44.9 in adult [facility]) Lifestyle discussed.    HPI, Exam and A&P Transcribed under the direction and in the presence of Miguel Aschoff, Brooke Bonito., MD. Electronically Signed: Althea Charon, RMA I have done the exam and reviewed the chart and it is accurate to the best of my knowledge. Development worker, community has been used and  any errors in dictation or transcription are unintentional. Miguel Aschoff M.D. [facility]

## 2018-01-28 LAB — COMPREHENSIVE METABOLIC PANEL WITH GFR
ALT: 25 IU/L (ref 0–44)
AST: 18 IU/L (ref 0–40)
Albumin/Globulin Ratio: 1.8 (ref 1.2–2.2)
Albumin: 4.1 g/dL (ref 3.5–4.8)
Alkaline Phosphatase: 87 IU/L (ref 39–117)
BUN/Creatinine Ratio: 10 (ref 10–24)
BUN: 11 mg/dL (ref 8–27)
Bilirubin Total: 0.8 mg/dL (ref 0.0–1.2)
CO2: 24 mmol/L (ref 20–29)
Calcium: 8.8 mg/dL (ref 8.6–10.2)
Chloride: 105 mmol/L (ref 96–106)
Creatinine, Ser: 1.07 mg/dL (ref 0.76–1.27)
GFR calc Af Amer: 78 mL/min/1.73 (ref >59)
GFR calc non Af Amer: 67 mL/min/1.73 (ref >59)
Globulin, Total: 2.3 g/dL (ref 1.5–4.5)
Glucose: 102 mg/dL — ABNORMAL HIGH (ref 65–99)
Potassium: 4.7 mmol/L (ref 3.5–5.2)
Sodium: 143 mmol/L (ref 134–144)
Total Protein: 6.4 g/dL (ref 6.0–8.5)

## 2018-01-28 LAB — CBC WITH DIFFERENTIAL/PLATELET
Basophils Absolute: 0.1 x10E3/uL (ref 0.0–0.2)
Basos: 1 %
EOS (ABSOLUTE): 0.1 x10E3/uL (ref 0.0–0.4)
Eos: 2 %
Hematocrit: 40.7 % (ref 37.5–51.0)
Hemoglobin: 14.1 g/dL (ref 13.0–17.7)
Immature Grans (Abs): 0 x10E3/uL (ref 0.0–0.1)
Immature Granulocytes: 0 %
Lymphocytes Absolute: 1.6 x10E3/uL (ref 0.7–3.1)
Lymphs: 25 %
MCH: 31.9 pg (ref 26.6–33.0)
MCHC: 34.6 g/dL (ref 31.5–35.7)
MCV: 92 fL (ref 79–97)
Monocytes Absolute: 0.4 x10E3/uL (ref 0.1–0.9)
Monocytes: 7 %
Neutrophils Absolute: 4.3 x10E3/uL (ref 1.4–7.0)
Neutrophils: 65 %
Platelets: 279 x10E3/uL (ref 150–450)
RBC: 4.42 x10E6/uL (ref 4.14–5.80)
RDW: 13.1 % (ref 11.6–15.4)
WBC: 6.6 x10E3/uL (ref 3.4–10.8)

## 2018-01-28 LAB — LIPID PANEL WITH LDL/HDL RATIO
CHOLESTEROL TOTAL: 153 mg/dL (ref 100–199)
HDL: 42 mg/dL (ref >39)
LDL Calculated: 98 mg/dL (ref 0–99)
LDl/HDL Ratio: 2.3 ratio (ref 0.0–3.6)
Triglycerides: 63 mg/dL (ref 0–149)
VLDL CHOLESTEROL CAL: 13 mg/dL (ref 5–40)

## 2018-01-28 LAB — TSH: TSH: 4.72 u[IU]/mL — ABNORMAL HIGH (ref 0.450–4.500)

## 2018-03-09 DIAGNOSIS — Z85828 Personal history of other malignant neoplasm of skin: Secondary | ICD-10-CM | POA: Diagnosis not present

## 2018-03-09 DIAGNOSIS — L570 Actinic keratosis: Secondary | ICD-10-CM | POA: Diagnosis not present

## 2018-03-09 DIAGNOSIS — L900 Lichen sclerosus et atrophicus: Secondary | ICD-10-CM | POA: Diagnosis not present

## 2018-03-09 DIAGNOSIS — D485 Neoplasm of uncertain behavior of skin: Secondary | ICD-10-CM | POA: Diagnosis not present

## 2018-03-09 DIAGNOSIS — X32XXXA Exposure to sunlight, initial encounter: Secondary | ICD-10-CM | POA: Diagnosis not present

## 2018-03-09 DIAGNOSIS — Z08 Encounter for follow-up examination after completed treatment for malignant neoplasm: Secondary | ICD-10-CM | POA: Diagnosis not present

## 2018-03-18 DIAGNOSIS — L570 Actinic keratosis: Secondary | ICD-10-CM | POA: Diagnosis not present

## 2018-05-19 DIAGNOSIS — G4733 Obstructive sleep apnea (adult) (pediatric): Secondary | ICD-10-CM | POA: Diagnosis not present

## 2018-05-19 DIAGNOSIS — I340 Nonrheumatic mitral (valve) insufficiency: Secondary | ICD-10-CM | POA: Diagnosis not present

## 2018-05-19 DIAGNOSIS — I10 Essential (primary) hypertension: Secondary | ICD-10-CM | POA: Diagnosis not present

## 2018-05-19 DIAGNOSIS — E782 Mixed hyperlipidemia: Secondary | ICD-10-CM | POA: Diagnosis not present

## 2018-05-19 DIAGNOSIS — I493 Ventricular premature depolarization: Secondary | ICD-10-CM | POA: Diagnosis not present

## 2018-05-19 DIAGNOSIS — I272 Pulmonary hypertension, unspecified: Secondary | ICD-10-CM | POA: Diagnosis not present

## 2018-05-27 ENCOUNTER — Other Ambulatory Visit: Payer: Self-pay | Admitting: Family Medicine

## 2018-06-10 DIAGNOSIS — L309 Dermatitis, unspecified: Secondary | ICD-10-CM | POA: Diagnosis not present

## 2018-06-10 DIAGNOSIS — L900 Lichen sclerosus et atrophicus: Secondary | ICD-10-CM | POA: Diagnosis not present

## 2018-07-06 ENCOUNTER — Other Ambulatory Visit: Payer: Self-pay | Admitting: Family Medicine

## 2018-07-06 NOTE — Telephone Encounter (Signed)
Pharmacy requesting refills. Thanks!  

## 2018-07-28 ENCOUNTER — Ambulatory Visit (INDEPENDENT_AMBULATORY_CARE_PROVIDER_SITE_OTHER): Payer: PPO | Admitting: Family Medicine

## 2018-07-28 ENCOUNTER — Other Ambulatory Visit: Payer: Self-pay

## 2018-07-28 ENCOUNTER — Encounter: Payer: Self-pay | Admitting: Family Medicine

## 2018-07-28 VITALS — BP 120/64 | HR 66 | Temp 98.0°F | Resp 18 | Wt 276.8 lb

## 2018-07-28 DIAGNOSIS — Z6841 Body Mass Index (BMI) 40.0 and over, adult: Secondary | ICD-10-CM

## 2018-07-28 DIAGNOSIS — E785 Hyperlipidemia, unspecified: Secondary | ICD-10-CM

## 2018-07-28 DIAGNOSIS — N401 Enlarged prostate with lower urinary tract symptoms: Secondary | ICD-10-CM

## 2018-07-28 DIAGNOSIS — I10 Essential (primary) hypertension: Secondary | ICD-10-CM | POA: Diagnosis not present

## 2018-07-28 DIAGNOSIS — N529 Male erectile dysfunction, unspecified: Secondary | ICD-10-CM

## 2018-07-28 DIAGNOSIS — E039 Hypothyroidism, unspecified: Secondary | ICD-10-CM

## 2018-07-28 MED ORDER — SILDENAFIL CITRATE 20 MG PO TABS: ORAL_TABLET | ORAL | 5 refills | Status: DC

## 2018-07-28 NOTE — Progress Notes (Signed)
Patient: Patrick Thornton Male    DOB: Mar 24, 1940   78 y.o.   MRN: [id number] Visit Date: 07/28/2018  Today's Provider: [name], MD   Chief Complaint  Patient presents with  . Follow-up   Subjective:     HPI 6 Month follow up. Pts wife died 4 months ago.  Allergies  Allergen Reactions  . Tape Rash    Blisters; skin tore off when patient took off electrodes from wearing a holter monitor      Current Outpatient Medications:  .  cyanocobalamin 1000 MCG tablet, Take 1,000 mcg by mouth daily. , Disp: , Rfl:  .  cyclobenzaprine (FLEXERIL) 10 MG tablet, TAKE 1 TABLET BY MOUTH EVERY 8 HOURS AS NEEDED FOR MUSCLE SPASMS., Disp: 55 tablet, Rfl: 5 .  docusate sodium (COLACE) 100 MG capsule, Take 1 capsule (100 mg total) by mouth 2 (two) times daily., Disp: 60 capsule, Rfl: 0 .  doxazosin (CARDURA) 8 MG tablet, TAKE 1 TABLET BY MOUTH ONCE DAILY, Disp: 90 tablet, Rfl: 3 .  furosemide (LASIX) 20 MG tablet, TAKE 1 TABLET BY MOUTH ONCE A DAY (Patient taking differently: Take 10 mg by mouth daily. ), Disp: 30 tablet, Rfl: 11 .  ibuprofen (ADVIL,MOTRIN) 800 MG tablet, TAKE 1 TABLET BY MOUTH TWICE A DAY AS NEEDED, Disp: 60 tablet, Rfl: 11 .  Multiple Vitamin tablet, Take 1 tablet by mouth daily. , Disp: , Rfl:  .  quinapril (ACCUPRIL) 20 MG tablet, TAKE 1 TABLET BY MOUTH ONCE DAILY, Disp: 90 tablet, Rfl: 3 .  amLODipine (NORVASC) 5 MG tablet, Take 5 mg by mouth daily. , Disp: , Rfl:   Review of Systems  Constitutional: Negative.   Eyes: Negative.   Respiratory: Negative.   Cardiovascular: Negative.   Gastrointestinal: Negative.   Endocrine: Negative.   Musculoskeletal: Positive for arthralgias.  Allergic/Immunologic: Negative.   Neurological: Negative.   Hematological: Negative.   Psychiatric/Behavioral: Negative.   The pt requests Viagra for ED  Social History   Tobacco Use  . Smoking status: Former Smoker    Years: 40.00    Quit date: 01/04/1994    Years since  quitting: 24.5  . Smokeless tobacco: Never Used  Substance Use Topics  . Alcohol use: No      Objective:   BP 120/64 (BP Location: Right Arm, Patient Position: Sitting, Cuff Size: Large)   Pulse 66   Temp 98 F (36.7 C) (Oral)   Resp 18   Wt 276 lb 12.8 oz (125.6 kg)   SpO2 97%   BMI 38.61 kg/m  Vitals:   07/28/18 1021  BP: 120/64  Pulse: 66  Resp: 18  Temp: 98 F (36.7 C)  TempSrc: Oral  SpO2: 97%  Weight: 276 lb 12.8 oz (125.6 kg)     Physical Exam Vitals signs reviewed.  Constitutional:      Appearance: He is obese.  HENT:     Head: Normocephalic and atraumatic.     Right Ear: External ear normal.     Left Ear: External ear normal.     Nose: Nose normal.  Eyes:     General: No scleral icterus. Neck:     Vascular: No carotid bruit.  Cardiovascular:     Rate and Rhythm: Normal rate and regular rhythm.     Heart sounds: Normal heart sounds.  Abdominal:     Palpations: Abdomen is soft.  Lymphadenopathy:     Cervical: No cervical adenopathy.  Skin:  General: Skin is warm and dry.  Neurological:     General: No focal deficit present.     Mental Status: He is alert and oriented to person, place, and time.  Psychiatric:        Mood and Affect: Mood normal.        Behavior: Behavior normal.        Thought Content: Thought content normal.        Judgment: Judgment normal.      No results found for any visits on 07/28/18.     Assessment & Plan    1. Essential hypertension Controlled on quinapril and amlodipine.  2. Hyperlipidemia, unspecified hyperlipidemia type   3. Hypothyroidism, unspecified type   4. Benign prostatic hyperplasia with lower urinary tract symptoms, symptom details unspecified  - sildenafil (REVATIO) 20 MG tablet; Take 1-5 tablets by mouth daily.  Dispense: 30 tablet; Refill: 5  5. Erectile dysfunction, unspecified erectile dysfunction type  - sildenafil (REVATIO) 20 MG tablet; Take 1-5 tablets by mouth daily.  Dispense:  30 tablet; Refill: 5  6. Class 3 severe obesity due to excess calories with serious comorbidity and body mass index (BMI) of 40.0 to 44.9 in adult [facility] Lifestyle stressed.     Richard Cranford Mon, MD  [facility]

## 2018-09-15 DIAGNOSIS — L900 Lichen sclerosus et atrophicus: Secondary | ICD-10-CM | POA: Diagnosis not present

## 2018-09-15 DIAGNOSIS — D1801 Hemangioma of skin and subcutaneous tissue: Secondary | ICD-10-CM | POA: Diagnosis not present

## 2018-09-15 DIAGNOSIS — Z08 Encounter for follow-up examination after completed treatment for malignant neoplasm: Secondary | ICD-10-CM | POA: Diagnosis not present

## 2018-09-15 DIAGNOSIS — L570 Actinic keratosis: Secondary | ICD-10-CM | POA: Diagnosis not present

## 2018-09-15 DIAGNOSIS — Z85828 Personal history of other malignant neoplasm of skin: Secondary | ICD-10-CM | POA: Diagnosis not present

## 2018-09-22 DIAGNOSIS — R600 Localized edema: Secondary | ICD-10-CM | POA: Diagnosis not present

## 2018-09-22 DIAGNOSIS — I119 Hypertensive heart disease without heart failure: Secondary | ICD-10-CM | POA: Diagnosis not present

## 2018-09-22 DIAGNOSIS — I272 Pulmonary hypertension, unspecified: Secondary | ICD-10-CM | POA: Diagnosis not present

## 2018-09-22 DIAGNOSIS — Z23 Encounter for immunization: Secondary | ICD-10-CM | POA: Diagnosis not present

## 2018-09-22 DIAGNOSIS — I10 Essential (primary) hypertension: Secondary | ICD-10-CM | POA: Diagnosis not present

## 2018-09-22 DIAGNOSIS — G4733 Obstructive sleep apnea (adult) (pediatric): Secondary | ICD-10-CM | POA: Diagnosis not present

## 2018-09-22 DIAGNOSIS — I071 Rheumatic tricuspid insufficiency: Secondary | ICD-10-CM | POA: Diagnosis not present

## 2018-09-22 DIAGNOSIS — E782 Mixed hyperlipidemia: Secondary | ICD-10-CM | POA: Diagnosis not present

## 2018-09-30 ENCOUNTER — Other Ambulatory Visit: Payer: Self-pay | Admitting: Family Medicine

## 2018-09-30 NOTE — Telephone Encounter (Signed)
Pharmacy requesting refills. Thanks!  

## 2018-11-24 NOTE — Progress Notes (Signed)
Patient: Patrick Thornton Male    DOB: 04-19-1940   78 y.o.   MRN: [id number] Visit Date: 11/25/2018  Today's Provider: [name], MD   Chief Complaint  Patient presents with  . Follow-up  . Hypertension  . Hyperlipidemia  . Hypothyroidism  . Erectile Dysfunction   Subjective:     HPI   Pt coping ok since the death of his wife. He still cares for his very sick brother in law. Essential hypertension From 07/28/2018-Controlled on quinapril and amlodipine.  Home bp reading averaging around 120/60.  Hyperlipidemia, unspecified hyperlipidemia type From 01/27/2018-labs checked, stable.  Hypothyroidism, unspecified type From 01/27/2018-labs checked, stable.  Benign prostatic hyperplasia with lower urinary tract symptoms, symptom details unspecified From 07/28/2018-currently taking sildenafil (REVATIO) 20 MG tablet; Take 1-5 tablets by mouth daily.   Erectile dysfunction, unspecified erectile dysfunction type From 07/28/2018-currently taking sildenafil (REVATIO) 20 MG tablet; Take 1-5 tablets by mouth daily.   Class 3 severe obesity due to excess calories with serious comorbidity and body mass index (BMI) of 40.0 to 44.9 in adult [facility]) From 07/28/2018-Lifestyle stressed.   Allergies  Allergen Reactions  . Tape Rash    Blisters; skin tore off when patient took off electrodes from wearing a holter monitor      Current Outpatient Medications:  .  amLODipine (NORVASC) 5 MG tablet, Take 5 mg by mouth daily. , Disp: , Rfl:  .  doxazosin (CARDURA) 8 MG tablet, TAKE 1 TABLET BY MOUTH ONCE DAILY (Patient taking differently: 1/2 tablet daily), Disp: 90 tablet, Rfl: 3 .  furosemide (LASIX) 20 MG tablet, TAKE 1 TABLET BY MOUTH ONCE A DAY, Disp: 30 tablet, Rfl: 11 .  ibuprofen (ADVIL,MOTRIN) 800 MG tablet, TAKE 1 TABLET BY MOUTH TWICE A DAY AS NEEDED, Disp: 60 tablet, Rfl: 11 .  Multiple Vitamin tablet, Take 1 tablet by mouth daily. , Disp: , Rfl:  .  quinapril  (ACCUPRIL) 20 MG tablet, TAKE 1 TABLET BY MOUTH ONCE DAILY, Disp: 90 tablet, Rfl: 3 .  sildenafil (REVATIO) 20 MG tablet, Take 1-5 tablets by mouth daily., Disp: 30 tablet, Rfl: 5 .  cyanocobalamin 1000 MCG tablet, Take 1,000 mcg by mouth daily. , Disp: , Rfl:  .  cyclobenzaprine (FLEXERIL) 10 MG tablet, TAKE 1 TABLET BY MOUTH EVERY 8 HOURS AS NEEDED FOR MUSCLE SPASMS. (Patient not taking: Reported on 11/25/2018), Disp: 55 tablet, Rfl: 5 .  docusate sodium (COLACE) 100 MG capsule, Take 1 capsule (100 mg total) by mouth 2 (two) times daily. (Patient not taking: Reported on 11/25/2018), Disp: 60 capsule, Rfl: 0  Review of Systems  Constitutional: Negative for appetite change, chills and fever.  HENT: Negative.   Eyes: Negative.   Respiratory: Negative for chest tightness, shortness of breath and wheezing.   Cardiovascular: Negative for chest pain and palpitations.  Gastrointestinal: Negative for abdominal pain, nausea and vomiting.  Endocrine: Negative.   Allergic/Immunologic: Negative.   Neurological: Negative.   Hematological: Negative.   Psychiatric/Behavioral: Negative.     Social History   Tobacco Use  . Smoking status: Former Smoker    Years: 40.00    Quit date: 01/04/1994    Years since quitting: 24.9  . Smokeless tobacco: Never Used  Substance Use Topics  . Alcohol use: No      Objective:   BP (!) 152/85 (BP Location: Right Arm, Patient Position: Sitting, Cuff Size: Large)   Pulse 67   Temp (!) 97.1 F (36.2 C) (Other (  Comment))   Resp 18   Ht 5\' 11"  (1.803 m)   Wt 277 lb (125.6 kg)   SpO2 96%   BMI 38.63 kg/m  Vitals:   11/25/18 0928  BP: (!) 152/85  Pulse: 67  Resp: 18  Temp: (!) 97.1 F (36.2 C)  TempSrc: Other (Comment)  SpO2: 96%  Weight: 277 lb (125.6 kg)  Height: 5\' 11"  (1.803 m)  Body mass index is 38.63 kg/m.   Physical Exam Vitals signs reviewed.  Constitutional:      Appearance: Normal appearance. He is obese.  HENT:     Head:  Normocephalic and atraumatic.     Right Ear: Tympanic membrane normal.     Left Ear: Tympanic membrane normal.     Nose: Nose normal.     Mouth/Throat:     Mouth: Mucous membranes are moist.     Pharynx: Oropharynx is clear.  Eyes:     Extraocular Movements: Extraocular movements intact.     Conjunctiva/sclera: Conjunctivae normal.     Pupils: Pupils are equal, round, and reactive to light.  Neck:     Musculoskeletal: Normal range of motion and neck supple.  Cardiovascular:     Rate and Rhythm: Normal rate and regular rhythm.     Pulses: Normal pulses.     Heart sounds: Normal heart sounds.  Pulmonary:     Effort: Pulmonary effort is normal.     Breath sounds: Normal breath sounds.  Abdominal:     General: Abdomen is flat. Bowel sounds are normal.     Palpations: Abdomen is soft.  Musculoskeletal: Normal range of motion.  Skin:    General: Skin is warm and dry.     Comments: Small puncture wound right forearm.Not infected.  Neurological:     General: No focal deficit present.     Mental Status: He is alert and oriented to person, place, and time. Mental status is at baseline.  Psychiatric:        Mood and Affect: Mood normal.        Behavior: Behavior normal.        Thought Content: Thought content normal.        Judgment: Judgment normal.      No results found for any visits on 11/25/18.     Assessment & Plan    1. Essential hypertension Controlled.  2. Hyperlipidemia, unspecified hyperlipidemia type   3. Hypothyroidism, unspecified type   4. Need for influenza vaccination  - Flu Vaccine QUAD High Dose(Fluad)  5. Puncture wound of right forearm, initial encounter  - Td : Tetanus/diphtheria >7yo Preservative  Free 6.BPH Follow up in January for AWV and CPE.      I,[name],acting as a scribe for [name], MD.,have documented all relevant documentation on the behalf of [name], MD,as directed by  [name], MD while in  the presence of [name], MD.   [name], MD  [facility]

## 2018-11-25 ENCOUNTER — Ambulatory Visit (INDEPENDENT_AMBULATORY_CARE_PROVIDER_SITE_OTHER): Payer: PPO | Admitting: Family Medicine

## 2018-11-25 ENCOUNTER — Encounter: Payer: Self-pay | Admitting: Family Medicine

## 2018-11-25 ENCOUNTER — Other Ambulatory Visit: Payer: Self-pay

## 2018-11-25 VITALS — BP 152/85 | HR 67 | Temp 97.1°F | Resp 18 | Ht 71.0 in | Wt 277.0 lb

## 2018-11-25 DIAGNOSIS — E039 Hypothyroidism, unspecified: Secondary | ICD-10-CM

## 2018-11-25 DIAGNOSIS — S51831A Puncture wound without foreign body of right forearm, initial encounter: Secondary | ICD-10-CM | POA: Diagnosis not present

## 2018-11-25 DIAGNOSIS — Z23 Encounter for immunization: Secondary | ICD-10-CM

## 2018-11-25 DIAGNOSIS — E785 Hyperlipidemia, unspecified: Secondary | ICD-10-CM

## 2018-11-25 DIAGNOSIS — N401 Enlarged prostate with lower urinary tract symptoms: Secondary | ICD-10-CM | POA: Diagnosis not present

## 2018-11-25 DIAGNOSIS — I10 Essential (primary) hypertension: Secondary | ICD-10-CM | POA: Diagnosis not present

## 2018-12-03 DIAGNOSIS — H2511 Age-related nuclear cataract, right eye: Secondary | ICD-10-CM | POA: Diagnosis not present

## 2019-01-28 NOTE — Progress Notes (Signed)
Subjective:   Patrick Thornton is a 79 y.o. male who presents for Medicare Annual/Subsequent preventive examination.    This visit is being conducted through telemedicine due to the COVID-19 pandemic. This patient has given me verbal consent via doximity to conduct this visit, patient states they are participating from their home address. Some vital signs may be absent or patient reported.    Patient identification: identified by name, DOB, and current address  Review of Systems:  N/A  Cardiac Risk Factors include: advanced age (>3men, >79 women);dyslipidemia;hypertension;male gender     Objective:    Vitals: There were no vitals taken for this visit.  There is no height or weight on file to calculate BMI. Unable to obtain vitals due to visit being conducted via telephonically.   Advanced Directives 02/01/2019 01/27/2018 07/10/2016 01/09/2016 01/05/2016 11/08/2015 04/30/2015  Does Patient Have a Medical Advance Directive? Yes Yes Yes Yes Yes Yes Yes  Type of Paramedic of [place];Living will [place];Living will [place];Living will [place];Living will - Living will;Healthcare Power of [place];Living will  Does patient want to make changes to medical advance directive? - - - - No - Patient declined - -  Copy of [place] in Chart? No - copy requested No - copy requested Yes No - copy requested - - -    Tobacco Social History   Tobacco Use  Smoking Status Former Smoker  . Years: 40.00  . Quit date: 01/04/1994  . Years since quitting: 25.0  Smokeless Tobacco Never Used     Counseling given: Not Answered   Clinical Intake:  Pre-visit preparation completed: Yes  Pain : No/denies pain Pain Score: 0-No pain     Nutritional Risks: None Diabetes: No  How often do you need to have someone help you when you read instructions, pamphlets, or other  written materials from your doctor or pharmacy?: 1 - Never  Interpreter Needed?: No  Information entered by :: [facility], LPN  Past Medical History:  Diagnosis Date  . Arthritis   . Collar bone fracture    x 2  . Colon polyp 2003  . Dysrhythmia    occasionally skips a beat and has bradycardia(in the30's today)  . Edema    legs/feet occas  . History of hiatal hernia   . Hypertension   . Sleep apnea    20 years ago and did not wear cpap long   Past Surgical History:  Procedure Laterality Date  . BUNIONECTOMY WITH HAMMERTOE RECONSTRUCTION Left 2011  . CHOLECYSTECTOMY  1970  . CIRCUMCISION N/A 01/09/2016   Procedure: CIRCUMCISION ADULT;  Surgeon: Hollice Espy, MD;  Location: [facility] ORS;  Service: Urology;  Laterality: N/A;  . COLONOSCOPY  03-22-08   Dr Bary Castilla  . FOOT SURGERY Left    x 2 pins  . FRACTURE SURGERY Left 1970   pins in left big toe and first toe  . HERNIA REPAIR  1970  . OTHER SURGICAL HISTORY     transanal excision of a villous adenoma   Family History  Problem Relation Age of Onset  . Stroke Mother   . Cancer Father        melanoma  . Diabetes Father   . Hypertension Father   . Heart attack Father   . Hyperlipidemia Brother   . Diabetes Son   . COPD Son   . Obesity Son   . Heart attack Son   .  Hepatitis C Son   . Cirrhosis Son   . Kidney failure Son   . CAD Brother   . Diverticulitis Daughter    Social History   Socioeconomic History  . Marital status: Widowed    Spouse name: Sunday Spillers  . Number of children: 4  . Years of education: Not on file  . Highest education level: Some college, no degree  Occupational History  . Occupation: retired  Tobacco Use  . Smoking status: Former Smoker    Years: 40.00    Quit date: 01/04/1994    Years since quitting: 25.0  . Smokeless tobacco: Never Used  Substance and Sexual Activity  . Alcohol use: No  . Drug use: No  . Sexual activity: Never  Other Topics Concern  . Not on file  Social History  Narrative  . Not on file   Social Determinants of Health   Financial Resource Strain: Low Risk   . Difficulty of Paying Living Expenses: Not hard at all  Food Insecurity: No Food Insecurity  . Worried About Charity fundraiser in the Last Year: Never true  . Ran Out of Food in the Last Year: Never true  Transportation Needs: No Transportation Needs  . Lack of Transportation (Medical): No  . Lack of Transportation (Non-Medical): No  Physical Activity: Inactive  . Days of Exercise per Week: 0 days  . Minutes of Exercise per Session: 0 min  Stress: No Stress Concern Present  . Feeling of Stress : Not at all  Social Connections: Slightly Isolated  . Frequency of Communication with Friends and Family: More than three times a week  . Frequency of Social Gatherings with Friends and Family: Never  . Attends Religious Services: More than 4 times per year  . Active Member of Clubs or Organizations: Yes  . Attends Archivist Meetings: Never  . Marital Status: Widowed    Outpatient Encounter Medications as of 02/01/2019  Medication Sig  . amLODipine (NORVASC) 5 MG tablet Take 5 mg by mouth daily.   . clobetasol ointment (TEMOVATE) [id number] % Apply 1 application topically. As needed  . doxazosin (CARDURA) 8 MG tablet TAKE 1 TABLET BY MOUTH ONCE DAILY (Patient taking differently: 1/2 tablet daily)  . furosemide (LASIX) 20 MG tablet TAKE 1 TABLET BY MOUTH ONCE A DAY (Patient taking differently: as needed. )  . Multiple Vitamin tablet Take 1 tablet by mouth daily.   . quinapril (ACCUPRIL) 20 MG tablet TAKE 1 TABLET BY MOUTH ONCE DAILY  . vitamin E (VITAMIN E) 180 MG (400 UNITS) capsule Take 400 Units by mouth daily.  . cyanocobalamin 1000 MCG tablet Take 1,000 mcg by mouth daily.   . cyclobenzaprine (FLEXERIL) 10 MG tablet TAKE 1 TABLET BY MOUTH EVERY 8 HOURS AS NEEDED FOR MUSCLE SPASMS. (Patient not taking: Reported on 11/25/2018)  . docusate sodium (COLACE) 100 MG capsule Take 1 capsule  (100 mg total) by mouth 2 (two) times daily. (Patient not taking: Reported on 11/25/2018)  . ibuprofen (ADVIL,MOTRIN) 800 MG tablet TAKE 1 TABLET BY MOUTH TWICE A DAY AS NEEDED (Patient not taking: Reported on 02/01/2019)  . sildenafil (REVATIO) 20 MG tablet Take 1-5 tablets by mouth daily.   No facility-administered encounter medications on file as of 02/01/2019.    Activities of Daily Living In your present state of health, do you have any difficulty performing the following activities: 02/01/2019  Hearing? Y  Comment Does not wear hearing aids.  Vision? N  Difficulty concentrating or  making decisions? N  Walking or climbing stairs? Y  Comment Due to knee pains.  Dressing or bathing? N  Doing errands, shopping? N  Preparing Food and eating ? N  Using the Toilet? N  In the past six months, have you accidently leaked urine? N  Do you have problems with loss of bowel control? N  Managing your Medications? N  Managing your Finances? N  Housekeeping or managing your Housekeeping? N  Some recent data might be hidden    Patient Care Team: Jerrol Banana., MD as PCP - General (Family Medicine) Bary Castilla, Forest Gleason, MD (General Surgery) Birder Robson, MD as Referring Physician (Ophthalmology) Corey Skains, MD as Consulting Physician (Cardiology) Dasher, Rayvon Char, MD (Dermatology)   Assessment:   This is a routine wellness examination for Patrick Thornton.  Exercise Activities and Dietary recommendations Current Exercise Habits: The patient does not participate in regular exercise at present, Exercise limited by: None identified  Goals    . Exercise 3x per week (30 min per time)     Recommend increasing exercise. Pt to start walking 3 times a week for 30 minutes.        Fall Risk: Fall Risk  02/01/2019 01/27/2018 07/22/2017 07/10/2016 11/08/2015  Falls in the past year? 0 0 No No Yes  Number falls in past yr: 0 0 - - 1  Injury with Fall? 0 0 - - Yes    FALL RISK PREVENTION  PERTAINING TO THE HOME:  Any stairs in or around the home? Yes  If so, are there any without handrails? No   Home free of loose throw rugs in walkways, pet beds, electrical cords, etc? Yes  Adequate lighting in your home to reduce risk of falls? Yes   ASSISTIVE DEVICES UTILIZED TO PREVENT FALLS:  Life alert? No  Use of a cane, walker or w/c? No  Grab bars in the bathroom? Yes  Shower chair or bench in shower? Yes  Elevated toilet seat or a handicapped toilet? No   TIMED UP AND GO:  Was the test performed? No .    Depression Screen PHQ 2/9 Scores 02/01/2019 02/01/2019 01/27/2018 07/22/2017  PHQ - 2 Score 1 1 1  0    Cognitive Function: Declined today.      6CIT Screen 07/10/2016  What Year? 0 points  What month? 0 points  What time? 0 points  Count back from 20 0 points  Months in reverse 0 points  Repeat phrase 0 points  Total Score 0    Immunization History  Administered Date(s) Administered  . Fluad Quad(high Dose 65+) 11/25/2018  . Influenza Inj Mdck Quad Pf 12/09/2016  . Influenza, High Dose Seasonal PF 11/01/2014, 10/10/2015  . Influenza-Unspecified 10/21/2013  . Pneumococcal Conjugate-13 10/27/2013  . Pneumococcal Polysaccharide-23 07/30/2011  . Td 11/25/2018    Qualifies for Shingles Vaccine? Yes . Due for Shingrix. Pt has been advised to call insurance company to determine out of pocket expense. Advised may also receive vaccine at local pharmacy or Health Dept. Verbalized acceptance and understanding.  Tdap: Up to date  Flu Vaccine: Up to date  Pneumococcal Vaccine: Completed series  Screening Tests Health Maintenance  Topic Date Due  . TETANUS/TDAP  11/24/2028  . INFLUENZA VACCINE  Completed  . PNA vac Low Risk Adult  Completed   Cancer Screenings:  Colorectal Screening: No longer required.   Lung Cancer Screening: (Low Dose CT Chest recommended if Age 9-80 years, 30 pack-year currently smoking OR have quit  w/in 15years.) does not qualify.    Additional Screening:  Vision Screening: Recommended annual ophthalmology exams for early detection of glaucoma and other disorders of the eye.  Dental Screening: Recommended annual dental exams for proper oral hygiene  Community Resource Referral:  CRR required this visit?  No        Plan:  I have personally reviewed and addressed the Medicare Annual Wellness questionnaire and have noted the following in the patient's chart:  A. Medical and social history B. Use of alcohol, tobacco or illicit drugs  C. Current medications and supplements D. Functional ability and status E.  Nutritional status F.  Physical activity G. Advance directives H. List of other physicians I.  Hospitalizations, surgeries, and ER visits in previous 12 months J.  [place] such as hearing and vision if needed, cognitive and depression L. Referrals and appointments   In addition, I have reviewed and discussed with patient certain preventive protocols, quality metrics, and best practice recommendations. A written personalized care plan for preventive services as well as general preventive health recommendations were provided to patient.   [place]ora Score, [place]  [id number] Nurse Health Advisor   Nurse Notes: None.

## 2019-02-01 ENCOUNTER — Other Ambulatory Visit: Payer: Self-pay

## 2019-02-01 ENCOUNTER — Ambulatory Visit (INDEPENDENT_AMBULATORY_CARE_PROVIDER_SITE_OTHER): Payer: PPO

## 2019-02-01 DIAGNOSIS — Z0000 Encounter for general adult medical examination without abnormal findings: Secondary | ICD-10-CM

## 2019-02-01 NOTE — Patient Instructions (Signed)
Patrick Thornton , Thank you for taking time to come for your Medicare Wellness Visit. I appreciate your ongoing commitment to your health goals. Please review the following plan we discussed and let me know if I can assist you in the future.   Screening recommendations/referrals: Colonoscopy: No longer required.  Recommended yearly ophthalmology/optometry visit for glaucoma screening and checkup Recommended yearly dental visit for hygiene and checkup  Vaccinations: Influenza vaccine: Up to date Pneumococcal vaccine: Completed series Tdap vaccine: Up to date, due 11/2028 Shingles vaccine: Pt declines today.     Advanced directives: Please bring a copy of your POA (Power of Attorney) and/or Living Will to your next appointment.   Conditions/risks identified: Recommend to start walking 3 days a week for at least 30 minutes at at time.   Next appointment: 02/22/19 @ 11:00 AM with Dr Rosanna Randy. Declined scheduling an AWV for 2022 at this time.   Preventive Care 24 Years and Older, Male Preventive care refers to lifestyle choices and visits with your health care provider that can promote health and wellness. What does preventive care include?  A yearly physical exam. This is also called an annual well check.  Dental exams once or twice a year.  Routine eye exams. Ask your health care provider how often you should have your eyes checked.  Personal lifestyle choices, including:  Daily care of your teeth and gums.  Regular physical activity.  Eating a healthy diet.  Avoiding tobacco and drug use.  Limiting alcohol use.  Practicing safe sex.  Taking low doses of aspirin every day.  Taking vitamin and mineral supplements as recommended by your health care provider. What happens during an annual well check? The services and screenings done by your health care provider during your annual well check will depend on your age, overall health, lifestyle risk factors, and family history of  disease. Counseling  Your health care provider may ask you questions about your:  Alcohol use.  Tobacco use.  Drug use.  Emotional well-being.  Home and relationship well-being.  Sexual activity.  Eating habits.  History of falls.  Memory and ability to understand (cognition).  Work and work Statistician. Screening  You may have the following tests or measurements:  Height, weight, and BMI.  Blood pressure.  Lipid and cholesterol levels. These may be checked every 5 years, or more frequently if you are over 42 years old.  Skin check.  Lung cancer screening. You may have this screening every year starting at age 80 if you have a 30-pack-year history of smoking and currently smoke or have quit within the past 15 years.  Fecal occult blood test (FOBT) of the stool. You may have this test every year starting at age 92.  Flexible sigmoidoscopy or colonoscopy. You may have a sigmoidoscopy every 5 years or a colonoscopy every 10 years starting at age 80.  Prostate cancer screening. Recommendations will vary depending on your family history and other risks.  Hepatitis C blood test.  Hepatitis B blood test.  Sexually transmitted disease (STD) testing.  Diabetes screening. This is done by checking your blood sugar (glucose) after you have not eaten for a while (fasting). You may have this done every 1-3 years.  Abdominal aortic aneurysm (AAA) screening. You may need this if you are a current or former smoker.  Osteoporosis. You may be screened starting at age 28 if you are at high risk. Talk with your health care provider about your test results, treatment options, and if  necessary, the need for more tests. Vaccines  Your health care provider may recommend certain vaccines, such as:  Influenza vaccine. This is recommended every year.  Tetanus, diphtheria, and acellular pertussis (Tdap, Td) vaccine. You may need a Td booster every 10 years.  Zoster vaccine. You may  need this after age 47.  Pneumococcal 13-valent conjugate (PCV13) vaccine. One dose is recommended after age 16.  Pneumococcal polysaccharide (PPSV23) vaccine. One dose is recommended after age 50. Talk to your health care provider about which screenings and vaccines you need and how often you need them. This information is not intended to replace advice given to you by your health care provider. Make sure you discuss any questions you have with your health care provider. Document Released: 02/03/2015 Document Revised: 09/27/2015 Document Reviewed: 11/08/2014 Elsevier Interactive Patient Education  2017 [place] Prevention in the Home Falls can cause injuries. They can happen to people of all ages. There are many things you can do to make your home safe and to help prevent falls. What can I do on the outside of my home?  Regularly fix the edges of walkways and driveways and fix any cracks.  Remove anything that might make you trip as you walk through a door, such as a raised step or threshold.  Trim any bushes or trees on the path to your home.  Use bright outdoor lighting.  Clear any walking paths of anything that might make someone trip, such as rocks or tools.  Regularly check to see if handrails are loose or broken. Make sure that both sides of any steps have handrails.  Any raised decks and porches should have guardrails on the edges.  Have any leaves, snow, or ice cleared regularly.  Use sand or salt on walking paths during winter.  Clean up any spills in your garage right away. This includes oil or grease spills. What can I do in the bathroom?  Use night lights.  Install grab bars by the toilet and in the tub and shower. Do not use towel bars as grab bars.  Use non-skid mats or decals in the tub or shower.  If you need to sit down in the shower, use a plastic, non-slip stool.  Keep the floor dry. Clean up any water that spills on the floor as soon as it  happens.  Remove soap buildup in the tub or shower regularly.  Attach bath mats securely with double-sided non-slip rug tape.  Do not have throw rugs and other things on the floor that can make you trip. What can I do in the bedroom?  Use night lights.  Make sure that you have a light by your bed that is easy to reach.  Do not use any sheets or blankets that are too big for your bed. They should not hang down onto the floor.  Have a firm chair that has side arms. You can use this for support while you get dressed.  Do not have throw rugs and other things on the floor that can make you trip. What can I do in the kitchen?  Clean up any spills right away.  Avoid walking on wet floors.  Keep items that you use a lot in easy-to-reach places.  If you need to reach something above you, use a strong step stool that has a grab bar.  Keep electrical cords out of the way.  Do not use floor polish or wax that makes floors slippery. If you must  use wax, use non-skid floor wax.  Do not have throw rugs and other things on the floor that can make you trip. What can I do with my stairs?  Do not leave any items on the stairs.  Make sure that there are handrails on both sides of the stairs and use them. Fix handrails that are broken or loose. Make sure that handrails are as long as the stairways.  Check any carpeting to make sure that it is firmly attached to the stairs. Fix any carpet that is loose or worn.  Avoid having throw rugs at the top or bottom of the stairs. If you do have throw rugs, attach them to the floor with carpet tape.  Make sure that you have a light switch at the top of the stairs and the bottom of the stairs. If you do not have them, ask someone to add them for you. What else can I do to help prevent falls?  Wear shoes that:  Do not have high heels.  Have rubber bottoms.  Are comfortable and fit you well.  Are closed at the toe. Do not wear sandals.  If you  use a stepladder:  Make sure that it is fully opened. Do not climb a closed stepladder.  Make sure that both sides of the stepladder are locked into place.  Ask someone to hold it for you, if possible.  Clearly mark and make sure that you can see:  Any grab bars or handrails.  First and last steps.  Where the edge of each step is.  Use tools that help you move around (mobility aids) if they are needed. These include:  Canes.  Walkers.  Scooters.  Crutches.  Turn on the lights when you go into a dark area. Replace any light bulbs as soon as they burn out.  Set up your furniture so you have a clear path. Avoid moving your furniture around.  If any of your floors are uneven, fix them.  If there are any pets around you, be aware of where they are.  Review your medicines with your doctor. Some medicines can make you feel dizzy. This can increase your chance of falling. Ask your doctor what other things that you can do to help prevent falls. This information is not intended to replace advice given to you by your health care provider. Make sure you discuss any questions you have with your health care provider. Document Released: 11/03/2008 Document Revised: 06/15/2015 Document Reviewed: 02/11/2014 Elsevier Interactive Patient Education  2017 [employer].

## 2019-02-02 ENCOUNTER — Ambulatory Visit: Payer: PPO

## 2019-02-02 ENCOUNTER — Encounter: Payer: PPO | Admitting: Family Medicine

## 2019-02-19 NOTE — Progress Notes (Signed)
Patient: Patrick Thornton, Male    DOB: 1940/02/17, 79 y.o.   MRN: [id number] Visit Date: 02/22/2019  Today's Provider: [name], MD   Chief Complaint  Patient presents with  . Annual Exam   Subjective:     Complete Physical Patrick Thornton is a 79 y.o. male. He feels well. He reports no exercising. He reports he is sleeping fairly well. Patient's wife died this past year.  He is taking care of his uncle who is in the liver failure. He has had his first Covid vaccine. -----------------------------------------------------------  Colonoscopy: 04/13/2013  Review of Systems  Constitutional: Negative.   HENT: Positive for hearing loss and tinnitus.   Eyes: Negative.   Respiratory: Negative.   Cardiovascular: Positive for leg swelling.  Gastrointestinal: Negative.   Endocrine: Positive for cold intolerance.  Genitourinary: Negative.   Musculoskeletal: Negative.   Skin: Negative.   Allergic/Immunologic: Negative.   Neurological: Negative.   Hematological: Bruises/bleeds easily.  Psychiatric/Behavioral: Negative.     Social History   Socioeconomic History  . Marital status: Widowed    Spouse name: Sunday Spillers  . Number of children: 4  . Years of education: Not on file  . Highest education level: Some college, no degree  Occupational History  . Occupation: retired  Tobacco Use  . Smoking status: Former Smoker    Years: 40.00    Quit date: 01/04/1994    Years since quitting: 25.1  . Smokeless tobacco: Never Used  Substance and Sexual Activity  . Alcohol use: No  . Drug use: No  . Sexual activity: Never  Other Topics Concern  . Not on file  Social History Narrative  . Not on file   Social Determinants of Health   Financial Resource Strain: Low Risk   . Difficulty of Paying Living Expenses: Not hard at all  Food Insecurity: No Food Insecurity  . Worried About Charity fundraiser in the Last Year: Never true  . Ran Out of Food in the Last Year: Never  true  Transportation Needs: No Transportation Needs  . Lack of Transportation (Medical): No  . Lack of Transportation (Non-Medical): No  Physical Activity: Inactive  . Days of Exercise per Week: 0 days  . Minutes of Exercise per Session: 0 min  Stress: No Stress Concern Present  . Feeling of Stress : Not at all  Social Connections: Slightly Isolated  . Frequency of Communication with Friends and Family: More than three times a week  . Frequency of Social Gatherings with Friends and Family: Never  . Attends Religious Services: More than 4 times per year  . Active Member of Clubs or Organizations: Yes  . Attends Archivist Meetings: Never  . Marital Status: Widowed  Intimate Partner Violence: Not At Risk  . Fear of Current or Ex-Partner: No  . Emotionally Abused: No  . Physically Abused: No  . Sexually Abused: No    Past Medical History:  Diagnosis Date  . Arthritis   . Collar bone fracture    x 2  . Colon polyp 2003  . Dysrhythmia    occasionally skips a beat and has bradycardia(in the30's today)  . Edema    legs/feet occas  . History of hiatal hernia   . Hypertension   . Sleep apnea    20 years ago and did not wear cpap long     Patient Active Problem List   Diagnosis Date Noted  . SOBOE (shortness of  breath on exertion) 04/23/2017  . Weight gain 04/02/2017  . OSA (obstructive sleep apnea) 10/08/2016  . Balanoposthitis 12/01/2015  . Benign fibroma of prostate 11/01/2014  . ED (erectile dysfunction) of organic origin 11/01/2014  . Acid reflux 11/01/2014  . HLD (hyperlipidemia) 11/01/2014  . BP (high blood pressure) 11/01/2014  . Adiposity 11/01/2014  . Arthritis, degenerative 11/01/2014  . Cannot sleep 11/01/2014  . MI (mitral incompetence) 11/29/2013  . Personal history of colonic polyps 03/25/2013    Past Surgical History:  Procedure Laterality Date  . BUNIONECTOMY WITH HAMMERTOE RECONSTRUCTION Left 2011  . CHOLECYSTECTOMY  1970  . CIRCUMCISION  N/A 01/09/2016   Procedure: CIRCUMCISION ADULT;  Surgeon: Hollice Espy, MD;  Location: [facility] ORS;  Service: Urology;  Laterality: N/A;  . COLONOSCOPY  03-22-08   Dr Bary Castilla  . FOOT SURGERY Left    x 2 pins  . FRACTURE SURGERY Left 1970   pins in left big toe and first toe  . HERNIA REPAIR  1970  . OTHER SURGICAL HISTORY     transanal excision of a villous adenoma    His family history includes CAD in his brother; COPD in his son; Cancer in his father; Cirrhosis in his son; Diabetes in his father and son; Diverticulitis in his daughter; Heart attack in his father and son; Hepatitis C in his son; Hyperlipidemia in his brother; Hypertension in his father; Kidney failure in his son; Obesity in his son; Stroke in his mother.   Current Outpatient Medications:  .  amLODipine (NORVASC) 5 MG tablet, Take 5 mg by mouth daily. , Disp: , Rfl:  .  clobetasol ointment (TEMOVATE) [id number] %, Apply 1 application topically. As needed, Disp: , Rfl:  .  cyanocobalamin 1000 MCG tablet, Take 1,000 mcg by mouth daily. , Disp: , Rfl:  .  cyclobenzaprine (FLEXERIL) 10 MG tablet, TAKE 1 TABLET BY MOUTH EVERY 8 HOURS AS NEEDED FOR MUSCLE SPASMS. (Patient not taking: Reported on 11/25/2018), Disp: 55 tablet, Rfl: 5 .  docusate sodium (COLACE) 100 MG capsule, Take 1 capsule (100 mg total) by mouth 2 (two) times daily. (Patient not taking: Reported on 11/25/2018), Disp: 60 capsule, Rfl: 0 .  doxazosin (CARDURA) 8 MG tablet, TAKE 1 TABLET BY MOUTH ONCE DAILY (Patient taking differently: 1/2 tablet daily), Disp: 90 tablet, Rfl: 3 .  furosemide (LASIX) 20 MG tablet, TAKE 1 TABLET BY MOUTH ONCE A DAY (Patient taking differently: as needed. ), Disp: 30 tablet, Rfl: 11 .  ibuprofen (ADVIL,MOTRIN) 800 MG tablet, TAKE 1 TABLET BY MOUTH TWICE A DAY AS NEEDED (Patient not taking: Reported on 02/01/2019), Disp: 60 tablet, Rfl: 11 .  Multiple Vitamin tablet, Take 1 tablet by mouth daily. , Disp: , Rfl:  .  quinapril (ACCUPRIL) 20 MG  tablet, TAKE 1 TABLET BY MOUTH ONCE DAILY, Disp: 90 tablet, Rfl: 3 .  sildenafil (REVATIO) 20 MG tablet, Take 1-5 tablets by mouth daily., Disp: 30 tablet, Rfl: 5 .  vitamin E (VITAMIN E) 180 MG (400 UNITS) capsule, Take 400 Units by mouth daily., Disp: , Rfl:   Patient Care Team: Jerrol Banana., MD as PCP - General (Family Medicine) Bary Castilla, Forest Gleason, MD (General Surgery) Birder Robson, MD as Referring Physician (Ophthalmology) Corey Skains, MD as Consulting Physician (Cardiology) Dasher, Rayvon Char, MD (Dermatology)     Objective:    Vitals: There were no vitals taken for this visit.  Physical Exam Vitals reviewed.  Constitutional:      Appearance: Normal appearance.  He is obese.  HENT:     Head: Normocephalic and atraumatic.     Right Ear: Tympanic membrane normal.     Left Ear: Tympanic membrane normal.     Nose: Nose normal.     Mouth/Throat:     Mouth: Mucous membranes are moist.     Pharynx: Oropharynx is clear.  Eyes:     Extraocular Movements: Extraocular movements intact.     Conjunctiva/sclera: Conjunctivae normal.     Pupils: Pupils are equal, round, and reactive to light.  Cardiovascular:     Rate and Rhythm: Normal rate and regular rhythm.     Pulses: Normal pulses.     Heart sounds: Normal heart sounds.  Pulmonary:     Effort: Pulmonary effort is normal.     Breath sounds: Normal breath sounds.  Abdominal:     General: Abdomen is flat. Bowel sounds are normal.     Palpations: Abdomen is soft.  Musculoskeletal:        General: Normal range of motion.     Cervical back: Normal range of motion and neck supple.  Skin:    General: Skin is warm and dry.  Neurological:     General: No focal deficit present.     Mental Status: He is alert and oriented to person, place, and time.  Psychiatric:        Mood and Affect: Mood normal.        Behavior: Behavior normal.        Thought Content: Thought content normal.        Judgment: Judgment  normal.     Activities of Daily Living In your present state of health, do you have any difficulty performing the following activities: 02/01/2019  Hearing? Y  Comment Does not wear hearing aids.  Vision? N  Difficulty concentrating or making decisions? N  Walking or climbing stairs? Y  Comment Due to knee pains.  Dressing or bathing? N  Doing errands, shopping? N  Preparing Food and eating ? N  Using the Toilet? N  In the past six months, have you accidently leaked urine? N  Do you have problems with loss of bowel control? N  Managing your Medications? N  Managing your Finances? N  Housekeeping or managing your Housekeeping? N  Some recent data might be hidden    Fall Risk Assessment Fall Risk  02/01/2019 01/27/2018 07/22/2017 07/10/2016 11/08/2015  Falls in the past year? 0 0 No No Yes  Number falls in past yr: 0 0 - - 1  Injury with Fall? 0 0 - - Yes     Depression Screen PHQ 2/9 Scores 02/01/2019 02/01/2019 01/27/2018 07/22/2017  PHQ - 2 Score 1 1 1  0    6CIT Screen 07/10/2016  What Year? 0 points  What month? 0 points  What time? 0 points  Count back from 20 0 points  Months in reverse 0 points  Repeat phrase 0 points  Total Score 0       Assessment & Plan:    Annual Physical Reviewed patient's Family Medical History Reviewed and updated list of patient's medical providers Assessment of cognitive impairment was done Assessed patient's functional ability Established a written schedule for health screening [place] Completed and Reviewed  Exercise Activities and Dietary recommendations Goals    . Exercise 3x per week (30 min per time)     Recommend increasing exercise. Pt to start walking 3 times a week for 30 minutes.  Immunization History  Administered Date(s) Administered  . Fluad Quad(high Dose 65+) 11/25/2018  . Influenza Inj Mdck Quad Pf 12/09/2016  . Influenza, High Dose Seasonal PF 11/01/2014, 10/10/2015  .  Influenza-Unspecified 10/21/2013  . Pneumococcal Conjugate-13 10/27/2013  . Pneumococcal Polysaccharide-23 07/30/2011  . Td 11/25/2018    Health Maintenance  Topic Date Due  . TETANUS/TDAP  11/24/2028  . INFLUENZA VACCINE  Completed  . PNA vac Low Risk Adult  Completed     Discussed health benefits of physical activity, and encouraged him to engage in regular exercise appropriate for his age and condition.    ------------------------------------------------------------------------------------------------------------ 1. Annual physical exam   2. Essential hypertension Controlled on quinapril.RTC 6 months. - CBC with Differential/Platelet - TSH - Comprehensive metabolic panel  3. Class 3 severe obesity due to excess calories with serious comorbidity and body mass index (BMI) of 40.0 to 44.9 in adult [facility]) Obesity with pretension and sleep apnea and GERD - CBC with Differential/Platelet - TSH - Comprehensive metabolic panel  4. OSA (obstructive sleep apnea) Patient declines CPAP.  I have encouraged him to reconsider. - CBC with Differential/Platelet - TSH - Comprehensive metabolic panel  5. Gastroesophageal reflux disease without esophagitis  - CBC with Differential/Platelet - TSH - Comprehensive metabolic panel    [name], MD  [facility]

## 2019-02-22 ENCOUNTER — Other Ambulatory Visit: Payer: Self-pay

## 2019-02-22 ENCOUNTER — Encounter: Payer: Self-pay | Admitting: Family Medicine

## 2019-02-22 ENCOUNTER — Ambulatory Visit (INDEPENDENT_AMBULATORY_CARE_PROVIDER_SITE_OTHER): Payer: PPO | Admitting: Family Medicine

## 2019-02-22 VITALS — BP 119/70 | HR 56 | Temp 96.9°F | Wt 279.8 lb

## 2019-02-22 DIAGNOSIS — K219 Gastro-esophageal reflux disease without esophagitis: Secondary | ICD-10-CM | POA: Diagnosis not present

## 2019-02-22 DIAGNOSIS — G4733 Obstructive sleep apnea (adult) (pediatric): Secondary | ICD-10-CM | POA: Diagnosis not present

## 2019-02-22 DIAGNOSIS — Z6841 Body Mass Index (BMI) 40.0 and over, adult: Secondary | ICD-10-CM | POA: Diagnosis not present

## 2019-02-22 DIAGNOSIS — I10 Essential (primary) hypertension: Secondary | ICD-10-CM | POA: Diagnosis not present

## 2019-02-22 DIAGNOSIS — Z0000 Encounter for general adult medical examination without abnormal findings: Secondary | ICD-10-CM

## 2019-02-23 DIAGNOSIS — G4733 Obstructive sleep apnea (adult) (pediatric): Secondary | ICD-10-CM | POA: Diagnosis not present

## 2019-02-23 DIAGNOSIS — K219 Gastro-esophageal reflux disease without esophagitis: Secondary | ICD-10-CM | POA: Diagnosis not present

## 2019-02-23 DIAGNOSIS — I10 Essential (primary) hypertension: Secondary | ICD-10-CM | POA: Diagnosis not present

## 2019-02-23 DIAGNOSIS — Z6841 Body Mass Index (BMI) 40.0 and over, adult: Secondary | ICD-10-CM | POA: Diagnosis not present

## 2019-02-24 LAB — COMPREHENSIVE METABOLIC PANEL
ALT: 17 IU/L (ref 0–44)
AST: 17 IU/L (ref 0–40)
Albumin/Globulin Ratio: 1.6 (ref 1.2–2.2)
Albumin: 3.8 g/dL (ref 3.7–4.7)
Alkaline Phosphatase: 93 IU/L (ref 39–117)
BUN/Creatinine Ratio: 14 (ref 10–24)
BUN: 12 mg/dL (ref 8–27)
Bilirubin Total: 0.9 mg/dL (ref 0.0–1.2)
CO2: 23 mmol/L (ref 20–29)
Calcium: 8.7 mg/dL (ref 8.6–10.2)
Chloride: 106 mmol/L (ref 96–106)
Creatinine, Ser: 0.86 mg/dL (ref 0.76–1.27)
GFR calc Af Amer: 97 mL/min/{1.73_m2} (ref >59)
GFR calc non Af Amer: 84 mL/min/{1.73_m2} (ref >59)
Globulin, Total: 2.4 g/dL (ref 1.5–4.5)
Glucose: 89 mg/dL (ref 65–99)
Potassium: 4.6 mmol/L (ref 3.5–5.2)
Sodium: 143 mmol/L (ref 134–144)
Total Protein: 6.2 g/dL (ref 6.0–8.5)

## 2019-02-24 LAB — CBC WITH DIFFERENTIAL/PLATELET
Basophils Absolute: 0.1 10*3/uL (ref 0.0–0.2)
Basos: 1 %
EOS (ABSOLUTE): 0.2 10*3/uL (ref 0.0–0.4)
Eos: 3 %
Hematocrit: 38.8 % (ref 37.5–51.0)
Hemoglobin: 13.7 g/dL (ref 13.0–17.7)
Immature Grans (Abs): 0 10*3/uL (ref 0.0–0.1)
Immature Granulocytes: 0 %
Lymphocytes Absolute: 1.6 10*3/uL (ref 0.7–3.1)
Lymphs: 26 %
MCH: 32.8 pg (ref 26.6–33.0)
MCHC: 35.3 g/dL (ref 31.5–35.7)
MCV: 93 fL (ref 79–97)
Monocytes Absolute: 0.5 10*3/uL (ref 0.1–0.9)
Monocytes: 8 %
Neutrophils Absolute: 3.8 10*3/uL (ref 1.4–7.0)
Neutrophils: 62 %
Platelets: 230 10*3/uL (ref 150–450)
RBC: 4.18 x10E6/uL (ref 4.14–5.80)
RDW: 12.4 % (ref 11.6–15.4)
WBC: 6.1 10*3/uL (ref 3.4–10.8)

## 2019-02-24 LAB — TSH: TSH: 4.42 u[IU]/mL (ref 0.450–4.500)

## 2019-02-26 ENCOUNTER — Telehealth: Payer: Self-pay

## 2019-02-26 NOTE — Telephone Encounter (Signed)
Patient returned call and he was notified of PCP lab message.

## 2019-02-26 NOTE — Telephone Encounter (Signed)
-----   Message from Jerrol Banana., MD sent at 02/26/2019  8:16 AM EST ----- Labs good.

## 2019-02-26 NOTE — Telephone Encounter (Signed)
LMTCB  OK for [facility] triage to give results

## 2019-04-02 DIAGNOSIS — I119 Hypertensive heart disease without heart failure: Secondary | ICD-10-CM | POA: Diagnosis not present

## 2019-04-02 DIAGNOSIS — I340 Nonrheumatic mitral (valve) insufficiency: Secondary | ICD-10-CM | POA: Diagnosis not present

## 2019-04-02 DIAGNOSIS — I10 Essential (primary) hypertension: Secondary | ICD-10-CM | POA: Diagnosis not present

## 2019-04-02 DIAGNOSIS — E782 Mixed hyperlipidemia: Secondary | ICD-10-CM | POA: Diagnosis not present

## 2019-04-02 DIAGNOSIS — I071 Rheumatic tricuspid insufficiency: Secondary | ICD-10-CM | POA: Diagnosis not present

## 2019-06-11 ENCOUNTER — Other Ambulatory Visit: Payer: Self-pay | Admitting: Family Medicine

## 2019-06-15 DIAGNOSIS — D2262 Melanocytic nevi of left upper limb, including shoulder: Secondary | ICD-10-CM | POA: Diagnosis not present

## 2019-06-15 DIAGNOSIS — L900 Lichen sclerosus et atrophicus: Secondary | ICD-10-CM | POA: Diagnosis not present

## 2019-06-15 DIAGNOSIS — D2271 Melanocytic nevi of right lower limb, including hip: Secondary | ICD-10-CM | POA: Diagnosis not present

## 2019-06-15 DIAGNOSIS — X32XXXA Exposure to sunlight, initial encounter: Secondary | ICD-10-CM | POA: Diagnosis not present

## 2019-06-15 DIAGNOSIS — Z85828 Personal history of other malignant neoplasm of skin: Secondary | ICD-10-CM | POA: Diagnosis not present

## 2019-06-15 DIAGNOSIS — D2261 Melanocytic nevi of right upper limb, including shoulder: Secondary | ICD-10-CM | POA: Diagnosis not present

## 2019-06-15 DIAGNOSIS — D225 Melanocytic nevi of trunk: Secondary | ICD-10-CM | POA: Diagnosis not present

## 2019-06-15 DIAGNOSIS — L570 Actinic keratosis: Secondary | ICD-10-CM | POA: Diagnosis not present

## 2019-06-22 ENCOUNTER — Other Ambulatory Visit: Payer: Self-pay | Admitting: Family Medicine

## 2019-08-23 NOTE — Progress Notes (Signed)
Trena Platt Cummings,acting as a scribe for [name], MD.,have documented all relevant documentation on the behalf of [name], MD,as directed by  [name], MD while in the presence of [name], MD.  Established patient visit   Patient: Patrick Thornton   DOB: 12-30-1940   79 y.o. Male  MRN: [id number] Visit Date: 08/24/2019  Today's healthcare provider: [name], MD   Chief Complaint  Patient presents with  . Hypertension   Subjective    HPI  Patient is feeling fairly well.  His brother-in-law finally died recently from liver cancer.  He has been caring for him for a couple of years.  He is now taking care of his son who has had CABG and has a tracheostomy.  His oldest daughter also has had CABG but she is not under the care of the patient. Hypertension, follow-up  BP Readings from Last 3 Encounters:  08/24/19 131/74  02/22/19 119/70  11/25/18 (!) 152/85   Wt Readings from Last 3 Encounters:  08/24/19 275 lb 6.4 oz (124.9 kg)  02/22/19 279 lb 12.8 oz (126.9 kg)  11/25/18 277 lb (125.6 kg)     He was last seen for hypertension 6 months ago.  BP at that visit was 119/70. Management since that visit includes; Controlled on quinapril.RTC 6 months. He reports excellent compliance with treatment. He is not having side effects.  He is not exercising. He is not adherent to low salt diet.   Outside blood pressures are being checked occasionally.  He does not smoke.  Use of agents associated with hypertension: none.   ---------------------------------------------------------------------------------------------------  Class 3 severe obesity due to excess calories with serious comorbidity and body mass index (BMI) of 40.0 to 44.9 in adult [facility]) From 02/22/2019-Obesity with pretension and sleep apnea and GERD  OSA (obstructive sleep apnea) From 02/22/2019-Patient declines CPAP.  I have encouraged him to  reconsider.  Gastroesophageal reflux disease without esophagitis From 02/22/2019-labs checked showing-good.   Social History   Tobacco Use  . Smoking status: Former Smoker    Years: 40.00    Quit date: 01/04/1994    Years since quitting: 25.6  . Smokeless tobacco: Never Used  Vaping Use  . Vaping Use: Never used  Substance Use Topics  . Alcohol use: No  . Drug use: No       Medications: Outpatient Medications Prior to Visit  Medication Sig  . clobetasol ointment (TEMOVATE) 3.78 % Apply 1 application topically. As needed  . cyanocobalamin 1000 MCG tablet Take 1,000 mcg by mouth daily.   Marland Kitchen doxazosin (CARDURA) 8 MG tablet TAKE 1 TABLET BY MOUTH ONCE A DAY  . furosemide (LASIX) 20 MG tablet TAKE 1 TABLET BY MOUTH ONCE A DAY (Patient taking differently: as needed. )  . Multiple Vitamin tablet Take 1 tablet by mouth daily.   . quinapril (ACCUPRIL) 20 MG tablet TAKE 1 TABLET BY MOUTH ONCE DAILY  . sildenafil (REVATIO) 20 MG tablet Take 1-5 tablets by mouth daily.  . vitamin E (VITAMIN E) 180 MG (400 UNITS) capsule Take 400 Units by mouth daily.  Marland Kitchen amLODipine (NORVASC) 5 MG tablet Take 5 mg by mouth daily.   . cyclobenzaprine (FLEXERIL) 10 MG tablet TAKE 1 TABLET BY MOUTH EVERY 8 HOURS AS NEEDED FOR MUSCLE SPASMS. (Patient not taking: Reported on 11/25/2018)  . docusate sodium (COLACE) 100 MG capsule Take 1 capsule (100 mg total) by mouth 2 (two) times daily. (Patient not taking:  Reported on 11/25/2018)  . ibuprofen (ADVIL,MOTRIN) 800 MG tablet TAKE 1 TABLET BY MOUTH TWICE A DAY AS NEEDED (Patient not taking: Reported on 02/01/2019)   No facility-administered medications prior to visit.    Review of Systems  Constitutional: Negative for appetite change, chills and fever.  Respiratory: Negative for chest tightness, shortness of breath and wheezing.   Cardiovascular: Negative for chest pain and palpitations.  Gastrointestinal: Negative for abdominal pain, nausea and vomiting.        Objective    BP 131/74 (BP Location: Right Arm, Patient Position: Sitting, Cuff Size: Large)   Pulse 71   Temp 98.6 F (37 C) (Temporal)   Ht 5\' 11"  (1.803 m)   Wt 275 lb 6.4 oz (124.9 kg)   BMI 38.41 kg/m  BP Readings from Last 3 Encounters:  08/24/19 131/74  02/22/19 119/70  11/25/18 (!) 152/85   Wt Readings from Last 3 Encounters:  08/24/19 275 lb 6.4 oz (124.9 kg)  02/22/19 279 lb 12.8 oz (126.9 kg)  11/25/18 277 lb (125.6 kg)      Physical Exam Vitals reviewed.  Constitutional:      Appearance: Normal appearance. He is obese.  HENT:     Head: Normocephalic and atraumatic.     Right Ear: Tympanic membrane normal.     Left Ear: Tympanic membrane normal.     Nose: Nose normal.     Mouth/Throat:     Mouth: Mucous membranes are moist.     Pharynx: Oropharynx is clear.  Eyes:     Extraocular Movements: Extraocular movements intact.     Conjunctiva/sclera: Conjunctivae normal.     Pupils: Pupils are equal, round, and reactive to light.  Cardiovascular:     Rate and Rhythm: Normal rate and regular rhythm.     Pulses: Normal pulses.     Heart sounds: Normal heart sounds.  Pulmonary:     Effort: Pulmonary effort is normal.     Breath sounds: Normal breath sounds.  Abdominal:     General: Abdomen is flat. Bowel sounds are normal.     Palpations: Abdomen is soft.  Musculoskeletal:        General: Normal range of motion.     Cervical back: Normal range of motion and neck supple.  Skin:    General: Skin is warm and dry.  Neurological:     General: No focal deficit present.     Mental Status: He is alert and oriented to person, place, and time. Mental status is at baseline.  Psychiatric:        Mood and Affect: Mood normal.        Behavior: Behavior normal.        Thought Content: Thought content normal.        Judgment: Judgment normal.       No results found for any visits on 08/24/19.  Assessment & Plan    1. Essential hypertension Controlled on  quinapril and amlodipine.  2. Class 3 severe obesity due to excess calories with serious comorbidity and body mass index (BMI) of 40.0 to 44.9 in adult [facility]) With hypertension and sleep apnea and reflux.  3. OSA (obstructive sleep apnea) Patient uses CPAP with success.  4. Gastroesophageal reflux disease without esophagitis Presently not needing treatment.    No follow-ups on file.         Gerri Acre Cranford Mon, MD  [facility] [phone] (phone) [phone] (fax)  [place]

## 2019-08-24 ENCOUNTER — Encounter: Payer: Self-pay | Admitting: Family Medicine

## 2019-08-24 ENCOUNTER — Other Ambulatory Visit: Payer: Self-pay

## 2019-08-24 ENCOUNTER — Ambulatory Visit (INDEPENDENT_AMBULATORY_CARE_PROVIDER_SITE_OTHER): Payer: PPO | Admitting: Family Medicine

## 2019-08-24 VITALS — BP 131/74 | HR 71 | Temp 98.6°F | Ht 71.0 in | Wt 275.4 lb

## 2019-08-24 DIAGNOSIS — G4733 Obstructive sleep apnea (adult) (pediatric): Secondary | ICD-10-CM | POA: Diagnosis not present

## 2019-08-24 DIAGNOSIS — Z6841 Body Mass Index (BMI) 40.0 and over, adult: Secondary | ICD-10-CM | POA: Diagnosis not present

## 2019-08-24 DIAGNOSIS — K219 Gastro-esophageal reflux disease without esophagitis: Secondary | ICD-10-CM

## 2019-08-24 DIAGNOSIS — I10 Essential (primary) hypertension: Secondary | ICD-10-CM

## 2019-10-03 DIAGNOSIS — Z20822 Contact with and (suspected) exposure to covid-19: Secondary | ICD-10-CM | POA: Diagnosis not present

## 2019-10-13 DIAGNOSIS — I272 Pulmonary hypertension, unspecified: Secondary | ICD-10-CM | POA: Diagnosis not present

## 2019-10-13 DIAGNOSIS — I119 Hypertensive heart disease without heart failure: Secondary | ICD-10-CM | POA: Diagnosis not present

## 2019-10-13 DIAGNOSIS — E782 Mixed hyperlipidemia: Secondary | ICD-10-CM | POA: Diagnosis not present

## 2019-10-13 DIAGNOSIS — I493 Ventricular premature depolarization: Secondary | ICD-10-CM | POA: Diagnosis not present

## 2019-10-13 DIAGNOSIS — I10 Essential (primary) hypertension: Secondary | ICD-10-CM | POA: Diagnosis not present

## 2019-10-13 DIAGNOSIS — I340 Nonrheumatic mitral (valve) insufficiency: Secondary | ICD-10-CM | POA: Diagnosis not present

## 2019-10-13 DIAGNOSIS — I071 Rheumatic tricuspid insufficiency: Secondary | ICD-10-CM | POA: Diagnosis not present

## 2019-10-13 DIAGNOSIS — G4733 Obstructive sleep apnea (adult) (pediatric): Secondary | ICD-10-CM | POA: Diagnosis not present

## 2019-10-13 DIAGNOSIS — R600 Localized edema: Secondary | ICD-10-CM | POA: Diagnosis not present

## 2019-10-28 ENCOUNTER — Other Ambulatory Visit: Payer: Self-pay | Admitting: Family Medicine

## 2019-10-28 NOTE — Telephone Encounter (Signed)
Requested Prescriptions  Pending Prescriptions Disp Refills   furosemide (LASIX) 20 MG tablet [Pharmacy Med Name: FUROSEMIDE 20 MG TAB] 30 tablet 3    Sig: TAKE 1 TABLET BY MOUTH ONCE DAILY     Cardiovascular:  Diuretics - Loop Passed - 10/28/2019  1:30 PM      Passed - K in normal range and within 360 days    Potassium  Date Value Ref Range Status  02/23/2019 4.6 3.5 - 5.2 mmol/L Final         Passed - Ca in normal range and within 360 days    Calcium  Date Value Ref Range Status  02/23/2019 8.7 8.6 - 10.2 mg/dL Final         Passed - Na in normal range and within 360 days    Sodium  Date Value Ref Range Status  02/23/2019 143 134 - 144 mmol/L Final         Passed - Cr in normal range and within 360 days    Creatinine, Ser  Date Value Ref Range Status  02/23/2019 0.86 0.76 - 1.27 mg/dL Final         Passed - Last BP in normal range    BP Readings from Last 1 Encounters:  08/24/19 131/74         Passed - Valid encounter within last 6 months    Recent Outpatient Visits          2 months ago Essential hypertension   [facility] Jerrol Banana., MD   8 months ago Annual physical exam   [facility] Jerrol Banana., MD   11 months ago Essential hypertension   [facility] Jerrol Banana., MD   1 year ago Essential hypertension   [facility] Jerrol Banana., MD   1 year ago Annual physical exam   [facility] Jerrol Banana., MD      Future Appointments            In 3 months Jerrol Banana., MD [facility] [place]

## 2019-12-06 DIAGNOSIS — H2513 Age-related nuclear cataract, bilateral: Secondary | ICD-10-CM | POA: Diagnosis not present

## 2019-12-24 NOTE — Progress Notes (Signed)
I,[name],acting as a scribe for Patrick Durie, MD.,have documented all relevant documentation on the behalf of Patrick Durie, MD,as directed by  Patrick Durie, MD while in the presence of Patrick Durie, MD.   Established patient visit   Patient: Patrick Thornton   DOB: 1940/03/09   79 y.o. Male  MRN: [id number] Visit Date: 12/27/2019  Today's healthcare provider: [name], MD   Chief Complaint  Patient presents with  . Back Pain   Subjective    Back Pain This is a chronic problem. The current episode started 1 to 4 weeks ago. The problem occurs constantly. The pain is present in the lumbar spine. The quality of the pain is described as cramping, stabbing, aching and shooting. Radiates to: radiates around to front rib area and down to right hip. The pain is at a severity of 7/10. The pain is moderate. The pain is the same all the time. The symptoms are aggravated by bending, coughing, position, sitting, standing and twisting. Pertinent negatives include no abdominal pain, bladder incontinence, bowel incontinence, chest pain, dysuria, fever, headaches, leg pain, numbness, paresis, paresthesias, pelvic pain, perianal numbness, tingling, weakness or weight loss. He has tried bed rest and NSAIDs (Aleve) for the symptoms. The treatment provided mild relief.    Patient has had back pain for years. Patient states a few weeks ago he began having pain sided back pain that is new. Pain occurs around right kidney area. Pain radiates occasionally around to front rib area and down to right hip. No urinary symptoms. Patient states he treats symptoms with rest and the occasional Aleve, with mild relief. This pain has been going on for several months.  It is aggravated by certain motions, particularly twisting motions.  Does not go down his leg.      Medications: Outpatient Medications Prior to Visit  Medication Sig  . amLODipine (NORVASC) 2.5 MG tablet Take by  mouth.  . clobetasol ointment (TEMOVATE) 1.96 % Apply 1 application topically. As needed  . cyanocobalamin 1000 MCG tablet Take 1,000 mcg by mouth daily.   Marland Kitchen doxazosin (CARDURA) 8 MG tablet TAKE 1 TABLET BY MOUTH ONCE A DAY  . furosemide (LASIX) 20 MG tablet TAKE 1 TABLET BY MOUTH ONCE DAILY  . Multiple Vitamin tablet Take 1 tablet by mouth daily.   . quinapril (ACCUPRIL) 20 MG tablet TAKE 1 TABLET BY MOUTH ONCE DAILY  . [DISCONTINUED] amLODipine (NORVASC) 5 MG tablet Take 5 mg by mouth daily. Takes 1/2 tablet daily  . cyclobenzaprine (FLEXERIL) 10 MG tablet TAKE 1 TABLET BY MOUTH EVERY 8 HOURS AS NEEDED FOR MUSCLE SPASMS. (Patient not taking: Reported on 11/25/2018)  . docusate sodium (COLACE) 100 MG capsule Take 1 capsule (100 mg total) by mouth 2 (two) times daily. (Patient not taking: Reported on 11/25/2018)  . ibuprofen (ADVIL,MOTRIN) 800 MG tablet TAKE 1 TABLET BY MOUTH TWICE A DAY AS NEEDED (Patient not taking: Reported on 02/01/2019)  . sildenafil (REVATIO) 20 MG tablet Take 1-5 tablets by mouth daily. (Patient not taking: Reported on 12/27/2019)  . vitamin E (VITAMIN E) 180 MG (400 UNITS) capsule Take 400 Units by mouth daily. (Patient not taking: Reported on 12/27/2019)   No facility-administered medications prior to visit.    Review of Systems  Constitutional: Negative for appetite change, chills, fever and weight loss.  Respiratory: Negative for chest tightness, shortness of breath and wheezing.   Cardiovascular: Negative for chest pain and palpitations.  Gastrointestinal: Negative  for abdominal pain, bowel incontinence, nausea and vomiting.  Genitourinary: Negative for bladder incontinence, dysuria and pelvic pain.  Musculoskeletal: Positive for back pain.  Neurological: Negative for tingling, weakness, numbness, headaches and paresthesias.       Objective    There were no vitals taken for this visit. BP Readings from Last 3 Encounters:  08/24/19 131/74  02/22/19 119/70   11/25/18 (!) 152/85   Wt Readings from Last 3 Encounters:  08/24/19 275 lb 6.4 oz (124.9 kg)  02/22/19 279 lb 12.8 oz (126.9 kg)  11/25/18 277 lb (125.6 kg)      Physical Exam Vitals reviewed.  Constitutional:      Appearance: Normal appearance. He is obese.  HENT:     Head: Normocephalic and atraumatic.     Right Ear: Tympanic membrane normal.     Left Ear: Tympanic membrane normal.     Nose: Nose normal.     Mouth/Throat:     Mouth: Mucous membranes are moist.     Pharynx: Oropharynx is clear.  Eyes:     Extraocular Movements: Extraocular movements intact.     Conjunctiva/sclera: Conjunctivae normal.     Pupils: Pupils are equal, round, and reactive to light.  Cardiovascular:     Rate and Rhythm: Normal rate and regular rhythm.     Pulses: Normal pulses.     Heart sounds: Normal heart sounds.  Pulmonary:     Effort: Pulmonary effort is normal.     Breath sounds: Normal breath sounds.  Abdominal:     General: Bowel sounds are normal.     Palpations: Abdomen is soft.     Tenderness: There is no abdominal tenderness.     Comments: No CVAT.  Musculoskeletal:     Cervical back: Normal range of motion and neck supple.  Skin:    General: Skin is warm and dry.  Neurological:     General: No focal deficit present.     Mental Status: He is alert and oriented to person, place, and time. Mental status is at baseline.  Psychiatric:        Mood and Affect: Mood normal.        Behavior: Behavior normal.        Thought Content: Thought content normal.        Judgment: Judgment normal.       No results found for any visits on 12/27/19.  Assessment & Plan     1. Chronic left-sided low back pain without sciatica  - DG Lumbar Spine Complete - cyclobenzaprine (FLEXERIL) 10 MG tablet; Take 1 tablet (10 mg total) by mouth at bedtime.  Dispense: 30 tablet; Refill: 0  2. Acute left flank pain  - DG Lumbar Spine Complete - US Abdomen Complete - POCT urinalysis  dipstick--Abnormal - MICROSCOPIC EXAMINATION--Normal   Return in about 2 months (around 02/27/2020).         Garwood Wentzell Cranford Mon, MD  [facility] [phone] (phone) [phone] (fax)  [place]

## 2019-12-27 ENCOUNTER — Ambulatory Visit (INDEPENDENT_AMBULATORY_CARE_PROVIDER_SITE_OTHER): Payer: PPO | Admitting: Family Medicine

## 2019-12-27 ENCOUNTER — Other Ambulatory Visit: Payer: Self-pay

## 2019-12-27 ENCOUNTER — Encounter: Payer: Self-pay | Admitting: Family Medicine

## 2019-12-27 ENCOUNTER — Ambulatory Visit
Admission: RE | Admit: 2019-12-27 | Discharge: 2019-12-27 | Disposition: A | Discharge status: Home / Self Care | Payer: PPO | Source: Ambulatory Visit | Attending: Family Medicine | Admitting: Family Medicine

## 2019-12-27 ENCOUNTER — Ambulatory Visit
Admission: RE | Admit: 2019-12-27 | Discharge: 2019-12-27 | Disposition: A | Discharge status: Home / Self Care | Payer: PPO | Attending: Family Medicine | Admitting: Family Medicine

## 2019-12-27 DIAGNOSIS — R109 Unspecified abdominal pain: Secondary | ICD-10-CM | POA: Diagnosis not present

## 2019-12-27 DIAGNOSIS — M545 Low back pain, unspecified: Secondary | ICD-10-CM | POA: Diagnosis not present

## 2019-12-27 DIAGNOSIS — M47816 Spondylosis without myelopathy or radiculopathy, lumbar region: Secondary | ICD-10-CM | POA: Diagnosis not present

## 2019-12-27 DIAGNOSIS — G8929 Other chronic pain: Secondary | ICD-10-CM | POA: Diagnosis not present

## 2019-12-27 LAB — POCT URINALYSIS DIPSTICK
Appearance: NORMAL
Bilirubin, UA: NEGATIVE
Blood, UA: NEGATIVE
Glucose, UA: NEGATIVE
Ketones, UA: NEGATIVE
Microscopic Examination: NORMAL
Nitrite, UA: NEGATIVE
Odor: NORMAL
Protein, UA: NEGATIVE
Spec Grav, UA: 1.02 (ref 1.010–1.025)
Urobilinogen, UA: 0.2 E.U./dL
pH, UA: 6 (ref 5.0–8.0)

## 2019-12-27 MED ORDER — CYCLOBENZAPRINE HCL 10 MG PO TABS: 10.0000 mg | ORAL_TABLET | Freq: Every day | ORAL | 0 refills | Status: DC

## 2019-12-27 NOTE — Patient Instructions (Addendum)
Try over-the-counter Icy Hot or Voltaren Gel for back pain.

## 2019-12-28 ENCOUNTER — Telehealth: Payer: Self-pay | Admitting: Family Medicine

## 2019-12-28 NOTE — Telephone Encounter (Signed)
Pt is requesting to get a call to go over his x ray results,Thanks

## 2019-12-28 NOTE — Telephone Encounter (Signed)
Spoke to pt again about different matter. He states he got another text about his results but would like a phone call because he does not know how to get into [facility] Chart

## 2019-12-30 NOTE — Telephone Encounter (Signed)
Patient was notified of results. Expressed understanding.  

## 2019-12-31 ENCOUNTER — Ambulatory Visit
Admission: RE | Admit: 2019-12-31 | Discharge: 2019-12-31 | Disposition: A | Discharge status: Home / Self Care | Payer: PPO | Source: Ambulatory Visit | Attending: Family Medicine | Admitting: Family Medicine

## 2019-12-31 ENCOUNTER — Telehealth: Payer: Self-pay | Admitting: *Deleted

## 2019-12-31 ENCOUNTER — Other Ambulatory Visit: Payer: Self-pay

## 2019-12-31 DIAGNOSIS — I714 Abdominal aortic aneurysm, without rupture: Secondary | ICD-10-CM | POA: Diagnosis not present

## 2019-12-31 DIAGNOSIS — N2889 Other specified disorders of kidney and ureter: Secondary | ICD-10-CM | POA: Diagnosis not present

## 2019-12-31 DIAGNOSIS — R109 Unspecified abdominal pain: Secondary | ICD-10-CM | POA: Diagnosis not present

## 2019-12-31 NOTE — Telephone Encounter (Signed)
This should be OK to wait until Monday.

## 2019-12-31 NOTE — Telephone Encounter (Signed)
[facility] called with call report on patient's abdominal US.

## 2020-01-06 ENCOUNTER — Telehealth: Payer: Self-pay

## 2020-01-06 DIAGNOSIS — K7689 Other specified diseases of liver: Secondary | ICD-10-CM

## 2020-01-06 DIAGNOSIS — N281 Cyst of kidney, acquired: Secondary | ICD-10-CM

## 2020-01-06 MED ORDER — DIAZEPAM 5 MG PO TABS: 5.0000 mg | ORAL_TABLET | Freq: Two times a day (BID) | ORAL | 1 refills | Status: DC | PRN

## 2020-01-06 NOTE — Telephone Encounter (Signed)
Orders placed for MRI of liver and kidneys.

## 2020-01-06 NOTE — Addendum Note (Signed)
Addended by: Eulas Post on: 01/06/2020 03:25 PM   Modules accepted: Orders

## 2020-01-06 NOTE — Telephone Encounter (Signed)
-----   Message from Jerrol Banana., MD sent at 01/06/2020  3:23 PM EST ----- Patient advised of results.  Patient advised of results.  Please set up MRI of the kidneys with and without contrast and MRI of the liver or right kidney mass and for possible liver mass.  Patient does not do well with closed spaces so also please send in Valium 5 mg 1 p.o. 1 hour before procedure, can repeat 30 minutes before procedure if not improved.  #2 no refills.

## 2020-01-19 ENCOUNTER — Ambulatory Visit
Admission: RE | Admit: 2020-01-19 | Discharge: 2020-01-19 | Disposition: A | Discharge status: Home / Self Care | Payer: PPO | Source: Ambulatory Visit | Attending: Family Medicine | Admitting: Family Medicine

## 2020-01-19 DIAGNOSIS — K7689 Other specified diseases of liver: Secondary | ICD-10-CM | POA: Insufficient documentation

## 2020-01-19 DIAGNOSIS — N281 Cyst of kidney, acquired: Secondary | ICD-10-CM | POA: Insufficient documentation

## 2020-01-19 DIAGNOSIS — K317 Polyp of stomach and duodenum: Secondary | ICD-10-CM | POA: Diagnosis not present

## 2020-01-19 DIAGNOSIS — K3189 Other diseases of stomach and duodenum: Secondary | ICD-10-CM | POA: Diagnosis not present

## 2020-01-19 MED ORDER — GADOBUTROL 1 MMOL/ML IV SOLN
10.0000 mL | Freq: Once | INTRAVENOUS | Status: AC | PRN
  Administered 2020-01-19: 13:00:00 10 mL via INTRAVENOUS

## 2020-01-25 ENCOUNTER — Telehealth: Payer: Self-pay

## 2020-01-25 DIAGNOSIS — K7689 Other specified diseases of liver: Secondary | ICD-10-CM

## 2020-01-25 DIAGNOSIS — N281 Cyst of kidney, acquired: Secondary | ICD-10-CM

## 2020-01-25 NOTE — Telephone Encounter (Signed)
-----   Message from Maple Hudson., MD sent at 01/24/2020 11:06 AM EST ----- Patient advised.  Please put in referral for urology for possible renal cell carcinoma on MRI.

## 2020-02-02 ENCOUNTER — Encounter: Payer: Self-pay | Admitting: Urology

## 2020-02-02 ENCOUNTER — Ambulatory Visit: Payer: PPO | Admitting: Urology

## 2020-02-02 ENCOUNTER — Other Ambulatory Visit: Payer: Self-pay

## 2020-02-02 VITALS — BP 181/76 | HR 76 | Ht 70.0 in | Wt 272.0 lb

## 2020-02-02 DIAGNOSIS — N2889 Other specified disorders of kidney and ureter: Secondary | ICD-10-CM

## 2020-02-02 NOTE — Progress Notes (Signed)
Subjective:   Patrick Thornton is a 80 y.o. male who presents for Medicare Annual/Subsequent prThurnell Loseeventive examination.  I connected with Patrick SanfilippoLarry Thornton today by telephone and verified that I am speaking with the correct person using two identifiers. Location patient: home Location provider: work Persons participating in the virtual visit: patient, provider.   I discussed the limitations, risks, security and privacy concerns of performing an evaluation and management service by telephone and the availability of in person appointments. I also discussed with the patient that there may be a patient responsible charge related to this service. The patient expressed understanding and verbally consented to this telephonic visit.    Interactive audio and video telecommunications were attempted between this provider and patient, however failed, due to patient having technical difficulties OR patient did not have access to video capability.  We continued and completed visit with audio only.   Review of Systems    N/A  Cardiac Risk Factors include: advanced age (>5955men, 84>65 women);obesity (BMI >30kg/m2);male gender;hypertension     Objective:    There were no vitals filed for this visit. There is no height or weight on file to calculate BMI.  Advanced Directives 02/03/2020 02/01/2019 01/27/2018 07/10/2016 01/09/2016 01/05/2016 11/08/2015  Does Patient Have a Medical Advance Directive? Yes Yes Yes Yes Yes Yes Yes  Type of Estate agentAdvance Directive Healthcare Power of [place]orney;Living will Healthcare Power of [place];Living will Healthcare Power of [place]orney;Living will Healthcare Power of [place];Living will Healthcare Power of [place];Living will - Living will;Healthcare Power of Attorney  Does patient want to make changes to medical advance directive? - - - - - No - Patient declined -  Copy of Healthcare Power of Attorney in Chart? No - copy requested No - copy requested No - copy requested Yes No - copy  requested - -    Current Medications (verified) Outpatient Encounter Medications as of 02/03/2020  Medication Sig  . amLODipine (NORVASC) 2.5 MG tablet Take 2.5 mg by mouth daily.  . clobetasol ointment (TEMOVATE) 0.05 % Apply 1 application topically. As needed  . doxazosin (CARDURA) 8 MG tablet TAKE 1 TABLET BY MOUTH ONCE A DAY  . furosemide (LASIX) 20 MG tablet TAKE 1 TABLET BY MOUTH ONCE DAILY  . Multiple Vitamin tablet Take 1 tablet by mouth daily.   . quinapril (ACCUPRIL) 20 MG tablet TAKE 1 TABLET BY MOUTH ONCE DAILY  . cyanocobalamin 1000 MCG tablet Take 1,000 mcg by mouth daily.  (Patient not taking: No sig reported)  . cyclobenzaprine (FLEXERIL) 10 MG tablet Take 1 tablet (10 mg total) by mouth at bedtime. (Patient not taking: No sig reported)  . diazepam (VALIUM) 5 MG tablet Take 1 tablet (5 mg total) by mouth every 12 (twelve) hours as needed for anxiety. Take 1 hour before MRI, may repeat 30 minutes before MRI if not calm. (Patient not taking: No sig reported)  . docusate sodium (COLACE) 100 MG capsule Take 1 capsule (100 mg total) by mouth 2 (two) times daily. (Patient not taking: No sig reported)  . ibuprofen (ADVIL,MOTRIN) 800 MG tablet TAKE 1 TABLET BY MOUTH TWICE A DAY AS NEEDED (Patient not taking: No sig reported)  . sildenafil (REVATIO) 20 MG tablet Take 1-5 tablets by mouth daily. (Patient not taking: No sig reported)  . vitamin E 180 MG (400 UNITS) capsule Take 400 Units by mouth daily. (Patient not taking: No sig reported)   No facility-administered encounter medications on file as of 02/03/2020.    Allergies (verified) Tape  History: Past Medical History:  Diagnosis Date  . Arthritis   . Collar bone fracture    x 2  . Colon polyp 2003  . Dysrhythmia    occasionally skips a beat and has bradycardia(in the30's today)  . Edema    legs/feet occas  . History of hiatal hernia   . Hypertension   . Renal mass, right   . Sleep apnea    20 years ago and did not  wear cpap long   Past Surgical History:  Procedure Laterality Date  . BUNIONECTOMY WITH HAMMERTOE RECONSTRUCTION Left 2011  . CHOLECYSTECTOMY  1970  . CIRCUMCISION N/A 01/09/2016   Procedure: CIRCUMCISION ADULT;  Surgeon: Hollice Espy, MD;  Location: [facility] ORS;  Service: Urology;  Laterality: N/A;  . COLONOSCOPY  03-22-08   Dr Bary Castilla  . FOOT SURGERY Left    x 2 pins  . FRACTURE SURGERY Left 1970   pins in left big toe and first toe  . HERNIA REPAIR  1970  . OTHER SURGICAL HISTORY     transanal excision of a villous adenoma   Family History  Problem Relation Age of Onset  . Stroke Mother   . Cancer Father        melanoma  . Diabetes Father   . Hypertension Father   . Heart attack Father   . Hyperlipidemia Brother   . Heart attack Brother   . Diabetes Son   . COPD Son   . Obesity Son   . Heart attack Son   . Hepatitis C Son   . Cirrhosis Son   . Kidney failure Son   . CAD Brother   . Diverticulitis Daughter   . Heart disease Daughter   . Heart attack Daughter    Social History   Socioeconomic History  . Marital status: Widowed    Spouse name: Patrick Thornton  . Number of children: 4  . Years of education: Not on file  . Highest education level: Some college, no degree  Occupational History  . Occupation: retired  Tobacco Use  . Smoking status: Former Smoker    Years: 40.00    Quit date: 01/04/1994    Years since quitting: 26.0  . Smokeless tobacco: Never Used  Vaping Use  . Vaping Use: Never used  Substance and Sexual Activity  . Alcohol use: No  . Drug use: No  . Sexual activity: Never  Other Topics Concern  . Not on file  Social History Narrative  . Not on file   Social Determinants of Health   Financial Resource Strain: Low Risk   . Difficulty of Paying Living Expenses: Not hard at all  Food Insecurity: No Food Insecurity  . Worried About Charity fundraiser in the Last Year: Never true  . Ran Out of Food in the Last Year: Never true  Transportation  Needs: No Transportation Needs  . Lack of Transportation (Medical): No  . Lack of Transportation (Non-Medical): No  Physical Activity: Inactive  . Days of Exercise per Week: 0 days  . Minutes of Exercise per Session: 0 min  Stress: No Stress Concern Present  . Feeling of Stress : Not at all  Social Connections: Moderately Isolated  . Frequency of Communication with Friends and Family: More than three times a week  . Frequency of Social Gatherings with Friends and Family: Twice a week  . Attends Religious Services: Never  . Active Member of Clubs or Organizations: Yes  . Attends Archivist Meetings:  Never  . Marital Status: Widowed    Tobacco Counseling Counseling given: Not Answered   Clinical Intake:  Pre-visit preparation completed: Yes  Pain : No/denies pain     Nutritional Risks: None Diabetes: No  How often do you need to have someone help you when you read instructions, pamphlets, or other written materials from your doctor or pharmacy?: 1 - Never  Diabetic? No  Interpreter Needed?: No  Information entered by :: [facility], LPN   Activities of Daily Living In your present state of health, do you have any difficulty performing the following activities: 02/03/2020 12/27/2019  Hearing? Y Y  Comment Does not wear hearing aids. -  Vision? N N  Difficulty concentrating or making decisions? N N  Walking or climbing stairs? N Y  Dressing or bathing? N N  Doing errands, shopping? N N  Preparing Food and eating ? N -  Using the Toilet? N -  In the past six months, have you accidently leaked urine? N -  Do you have problems with loss of bowel control? N -  Managing your Medications? N -  Managing your Finances? N -  Housekeeping or managing your Housekeeping? N -  Some recent data might be hidden    Patient Care Team: Jerrol Banana., MD as PCP - General (Family Medicine) Bary Castilla, Forest Gleason, MD (General Surgery) Birder Robson, MD as  Referring Physician (Ophthalmology) Dasher, Rayvon Char, MD (Dermatology) Teodoro Spray, MD as Consulting Physician (Cardiology) Abbie Sons, MD (Urology)  Indicate any recent Medical Services you may have received from other than [facility] providers in the past year (date may be approximate).     Assessment:   This is a routine wellness examination for Renaldo.  Hearing/Vision screen No exam data present  Dietary issues and exercise activities discussed: Current Exercise Habits: The patient does not participate in regular exercise at present, Exercise limited by: None identified  Goals    . Exercise 3x per week (30 min per time)     Recommend increasing exercise. Pt to start walking 3 times a week for 30 minutes.       Depression Screen PHQ 2/9 Scores 02/03/2020 12/27/2019 02/01/2019 02/01/2019 01/27/2018 07/22/2017 07/10/2016  PHQ - 2 Score 0 0 1 1 1  0 1  PHQ- 9 Score - 4 - - - - -    Fall Risk Fall Risk  02/03/2020 12/27/2019 02/01/2019 01/27/2018 07/22/2017  Falls in the past year? 0 0 0 0 No  Number falls in past yr: 0 0 0 0 -  Injury with Fall? 0 0 0 0 -  Follow up - Falls evaluation completed - - -    FALL RISK PREVENTION PERTAINING TO THE HOME:  Any stairs in or around the home? Yes  If so, are there any without handrails? No  Home free of loose throw rugs in walkways, pet beds, electrical cords, etc? Yes  Adequate lighting in your home to reduce risk of falls? Yes   ASSISTIVE DEVICES UTILIZED TO PREVENT FALLS:  Life alert? No  Use of a cane, walker or w/c? No  Grab bars in the bathroom? No  Shower chair or bench in shower? Yes  Elevated toilet seat or a handicapped toilet? No    Cognitive Function: Normal cognitive status assessed by observation by this Nurse Health Advisor. No abnormalities found.       6CIT Screen 07/10/2016  What Year? 0 points  What month? 0 points  What time? 0  points  Count back from 20 0 points  Months in reverse 0 points  Repeat phrase 0  points  Total Score 0    Immunizations Immunization History  Administered Date(s) Administered  . Fluad Quad(high Dose 65+) 11/25/2018  . Influenza Inj Mdck Quad Pf 12/09/2016  . Influenza, High Dose Seasonal PF 11/01/2014, 10/10/2015  . Influenza-Unspecified 10/21/2013, 10/21/2019  . PFIZER SARS-COV-2 Vaccination 02/15/2019, 03/15/2019, 09/24/2019  . Pneumococcal Conjugate-13 10/27/2013  . Pneumococcal Polysaccharide-23 07/30/2011  . Td 11/25/2018    TDAP status: Up to date  Flu Vaccine status: Up to date  Pneumococcal vaccine status: Up to date  Covid-19 vaccine status: Completed vaccines  Qualifies for Shingles Vaccine? Yes   Zostavax completed No   Shingrix Completed?: No.    Education has been provided regarding the importance of this vaccine. Patient has been advised to call insurance company to determine out of pocket expense if they have not yet received this vaccine. Advised may also receive vaccine at local pharmacy or Health Dept. Verbalized acceptance and understanding.  Screening Tests Health Maintenance  Topic Date Due  . TETANUS/TDAP  11/24/2028  . INFLUENZA VACCINE  Completed  . COVID-19 Vaccine  Completed  . PNA vac Low Risk Adult  Completed    Health Maintenance  There are no preventive care reminders to display for this patient.  Colorectal cancer screening: No longer required.   Lung Cancer Screening: (Low Dose CT Chest recommended if Age 53-80 years, 30 pack-year currently smoking OR have quit w/in 15years.) does not qualify.    Additional Screening:  Vision Screening: Recommended annual ophthalmology exams for early detection of glaucoma and other disorders of the eye. Is the patient up to date with their annual eye exam?  Yes  Who is the provider or what is the name of the office in which the patient attends annual eye exams? Dr George Ina @ [place] If pt is not established with a provider, would they like to be referred to a provider to establish  care? No .   Dental Screening: Recommended annual dental exams for proper oral hygiene  Community Resource Referral / Chronic Care Management: CRR required this visit?  No   CCM required this visit?  No      Plan:     I have personally reviewed and noted the following in the patient's chart:   . Medical and social history . Use of alcohol, tobacco or illicit drugs  . Current medications and supplements . Functional ability and status . Nutritional status . Physical activity . Advanced directives . List of other physicians . Hospitalizations, surgeries, and ER visits in previous 12 months . Vitals . Screenings to include cognitive, depression, and falls . Referrals and appointments  In addition, I have reviewed and discussed with patient certain preventive protocols, quality metrics, and best practice recommendations. A written personalized care plan for preventive services as well as general preventive health recommendations were provided to patient.     Taje Tondreau Holcomb, [place]   05/02/8784   Nurse Notes: None.

## 2020-02-02 NOTE — Progress Notes (Signed)
02/02/2020 1:21 PM   Patrick Thornton Aug 01, 1940 [id number]  Referring provider: Jerrol Banana., MD [address] [place],  [place] [address]  Chief Complaint  Patient presents with  . Renal mass    HPI: Patrick Thornton is a 80 y.o. male seen at request of Dr. Rosanna Randy for evaluation of a right renal mass.   [name] 12/27/2019 complaining of a several week history of left back pain localized to the flank region which radiated to the left side and hip region  Abdominal ultrasound was ordered which showed a 3.1 x 2.6 x 3.2 cm right upper pole mass that was considered indeterminate and an MRI was recommended  Bilateral simple cysts were noted; no hydronephrosis  MRI abdomen with without contrast performed 01/19/2020 remarkable for a 3.2 x 2.3 x 3.0 cm enhancing exophytic right renal mass  Mild urinary frequency and urgency  Denies dysuria or gross hematuria   PMH: Past Medical History:  Diagnosis Date  . Arthritis   . Collar bone fracture    x 2  . Colon polyp 2003  . Dysrhythmia    occasionally skips a beat and has bradycardia(in the30's today)  . Edema    legs/feet occas  . History of hiatal hernia   . Hypertension   . Sleep apnea    20 years ago and did not wear cpap long    Surgical History: Past Surgical History:  Procedure Laterality Date  . BUNIONECTOMY WITH HAMMERTOE RECONSTRUCTION Left 2011  . CHOLECYSTECTOMY  1970  . CIRCUMCISION N/A 01/09/2016   Procedure: CIRCUMCISION ADULT;  Surgeon: Hollice Espy, MD;  Location: [facility] ORS;  Service: Urology;  Laterality: N/A;  . COLONOSCOPY  03-22-08   Dr Bary Castilla  . FOOT SURGERY Left    x 2 pins  . FRACTURE SURGERY Left 1970   pins in left big toe and first toe  . HERNIA REPAIR  1970  . OTHER SURGICAL HISTORY     transanal excision of a villous adenoma    Home Medications:  Allergies as of 02/02/2020      Reactions   Tape Rash   Blisters; skin tore off when patient took off  electrodes from wearing a holter monitor       Medication List       Accurate as of February 02, 2020  1:21 PM. If you have any questions, ask your nurse or doctor.        amLODipine 2.5 MG tablet Commonly known as: NORVASC Take by mouth.   clobetasol ointment 0.05 % Commonly known as: TEMOVATE Apply 1 application topically. As needed   cyanocobalamin 1000 MCG tablet Take 1,000 mcg by mouth daily.   cyclobenzaprine 10 MG tablet Commonly known as: FLEXERIL Take 1 tablet (10 mg total) by mouth at bedtime.   diazepam 5 MG tablet Commonly known as: VALIUM Take 1 tablet (5 mg total) by mouth every 12 (twelve) hours as needed for anxiety. Take 1 hour before MRI, may repeat 30 minutes before MRI if not calm.   docusate sodium 100 MG capsule Commonly known as: COLACE Take 1 capsule (100 mg total) by mouth 2 (two) times daily.   doxazosin 8 MG tablet Commonly known as: CARDURA TAKE 1 TABLET BY MOUTH ONCE A DAY   furosemide 20 MG tablet Commonly known as: LASIX TAKE 1 TABLET BY MOUTH ONCE DAILY   ibuprofen 800 MG tablet Commonly known as: ADVIL TAKE 1 TABLET BY MOUTH TWICE A DAY AS  NEEDED   Multiple Vitamin tablet Take 1 tablet by mouth daily.   quinapril 20 MG tablet Commonly known as: ACCUPRIL TAKE 1 TABLET BY MOUTH ONCE DAILY   sildenafil 20 MG tablet Commonly known as: REVATIO Take 1-5 tablets by mouth daily.   vitamin E 180 MG (400 UNITS) capsule Take 400 Units by mouth daily.       Allergies:  Allergies  Allergen Reactions  . Tape Rash    Blisters; skin tore off when patient took off electrodes from wearing a holter monitor     Family History: Family History  Problem Relation Age of Onset  . Stroke Mother   . Cancer Father        melanoma  . Diabetes Father   . Hypertension Father   . Heart attack Father   . Hyperlipidemia Brother   . Diabetes Son   . COPD Son   . Obesity Son   . Heart attack Son   . Hepatitis C Son   . Cirrhosis Son   .  Kidney failure Son   . CAD Brother   . Diverticulitis Daughter     Social History:  reports that he quit smoking about 26 years ago. He quit after 40.00 years of use. He has never used smokeless tobacco. He reports that he does not drink alcohol and does not use drugs.   Physical Exam: BP (!) 181/76   Pulse 76   Ht 5\' 10"  (1.778 m)   Wt 272 lb (123.4 kg)   BMI 39.03 kg/m   Constitutional:  Alert and oriented, No acute distress. HEENT: [place] AT, moist mucus membranes.  Trachea midline, no masses. Cardiovascular: No clubbing, cyanosis, or edema. Respiratory: Normal respiratory effort, no increased work of breathing. Skin: No rashes, bruises or suspicious lesions. Neurologic: Grossly intact, no focal deficits, moving all 4 extremities. Psychiatric: Normal mood and affect.   Assessment & Plan:    1. T1 right renal mass A solid renal mass raises the suspicion of primary renal malignancy.  We discussed this in detail and in regards to the spectrum of renal masses which includes cysts (pure cysts are considered benign), solid masses and everything in between. The risk of metastasis increases as the size of solid renal mass increases. In general, it is believed that the risk of metastasis for renal masses less than 3-4 cm is small (up to approximately 5%) based mainly on large retrospective studies. In some cases and especially in patients of older age and multiple comorbidities a surveillance approach may be appropriate. The treatment of solid renal masses includes: surveillance, cryoablation (percutaneous and laparoscopic) in addition to partial and complete nephrectomy (each with option of laparoscopic, robotic and open depending on appropriateness). Furthermore, nephrectomy appears to be an independent risk factor for the development of chronic kidney disease suggesting that nephron sparing approaches should be implored whenever feasible. We reviewed these options in context of the patients  current situation as well as the pros and cons of each. He has initially elected surveillance and will schedule a follow-up visit June 2022 with a renal ultrasound prior   Abbie Sons, MD  [facility] [address] [place], [place] [address] [phone]

## 2020-02-03 ENCOUNTER — Ambulatory Visit (INDEPENDENT_AMBULATORY_CARE_PROVIDER_SITE_OTHER): Payer: PPO

## 2020-02-03 DIAGNOSIS — Z0000 Encounter for general adult medical examination without abnormal findings: Secondary | ICD-10-CM | POA: Diagnosis not present

## 2020-02-03 NOTE — Patient Instructions (Signed)
Patrick Thornton , Thank you for taking time to come for your Medicare Wellness Visit. I appreciate your ongoing commitment to your health goals. Please review the following plan we discussed and let me know if I can assist you in the future.   Screening recommendations/referrals: Colonoscopy: No longer required.  Recommended yearly ophthalmology/optometry visit for glaucoma screening and checkup Recommended yearly dental visit for hygiene and checkup  Vaccinations: Influenza vaccine: Done 09/2019 Pneumococcal vaccine: Completed series Tdap vaccine: Up to date, due 11/2028 Shingles vaccine: Shingrix discussed. Please contact your pharmacy for coverage information.     Advanced directives: Please bring a copy of your POA (Power of Attorney) and/or Living Will to your next appointment.   Conditions/risks identified: Recommend increasing exercise. Pt to start walking 3 times a week for 30 minutes.   Next appointment: 02/24/20 @ 9:00 AM with Dr Rosanna Randy   Preventive Care 79 Years and Older, Male Preventive care refers to lifestyle choices and visits with your health care provider that can promote health and wellness. What does preventive care include?  A yearly physical exam. This is also called an annual well check.  Dental exams once or twice a year.  Routine eye exams. Ask your health care provider how often you should have your eyes checked.  Personal lifestyle choices, including:  Daily care of your teeth and gums.  Regular physical activity.  Eating a healthy diet.  Avoiding tobacco and drug use.  Limiting alcohol use.  Practicing safe sex.  Taking low doses of aspirin every day.  Taking vitamin and mineral supplements as recommended by your health care provider. What happens during an annual well check? The services and screenings done by your health care provider during your annual well check will depend on your age, overall health, lifestyle risk factors, and family  history of disease. Counseling  Your health care provider may ask you questions about your:  Alcohol use.  Tobacco use.  Drug use.  Emotional well-being.  Home and relationship well-being.  Sexual activity.  Eating habits.  History of falls.  Memory and ability to understand (cognition).  Work and work Statistician. Screening  You may have the following tests or measurements:  Height, weight, and BMI.  Blood pressure.  Lipid and cholesterol levels. These may be checked every 5 years, or more frequently if you are over 79 years old.  Skin check.  Lung cancer screening. You may have this screening every year starting at age 79 if you have a 30-pack-year history of smoking and currently smoke or have quit within the past 15 years.  Fecal occult blood test (FOBT) of the stool. You may have this test every year starting at age 79  Flexible sigmoidoscopy or colonoscopy. You may have a sigmoidoscopy every 5 years or a colonoscopy every 10 years starting at age 79.  Prostate cancer screening. Recommendations will vary depending on your family history and other risks.  Hepatitis C blood test.  Hepatitis B blood test.  Sexually transmitted disease (STD) testing.  Diabetes screening. This is done by checking your blood sugar (glucose) after you have not eaten for a while (fasting). You may have this done every 1-3 years.  Abdominal aortic aneurysm (AAA) screening. You may need this if you are a current or former smoker.  Osteoporosis. You may be screened starting at age 79 if you are at high risk. Talk with your health care provider about your test results, treatment options, and if necessary, the need for more tests.  Vaccines  Your health care provider may recommend certain vaccines, such as:  Influenza vaccine. This is recommended every year.  Tetanus, diphtheria, and acellular pertussis (Tdap, Td) vaccine. You may need a Td booster every 10 years.  Zoster vaccine.  You may need this after age 79.  Pneumococcal 13-valent conjugate (PCV13) vaccine. One dose is recommended after age 79.  Pneumococcal polysaccharide (PPSV23) vaccine. One dose is recommended after age 80. Talk to your health care provider about which screenings and vaccines you need and how often you need them. This information is not intended to replace advice given to you by your health care provider. Make sure you discuss any questions you have with your health care provider. Document Released: 02/03/2015 Document Revised: 09/27/2015 Document Reviewed: 11/08/2014 Elsevier Interactive Patient Education  2017 [place] Prevention in the Home Falls can cause injuries. They can happen to people of all ages. There are many things you can do to make your home safe and to help prevent falls. What can I do on the outside of my home?  Regularly fix the edges of walkways and driveways and fix any cracks.  Remove anything that might make you trip as you walk through a door, such as a raised step or threshold.  Trim any bushes or trees on the path to your home.  Use bright outdoor lighting.  Clear any walking paths of anything that might make someone trip, such as rocks or tools.  Regularly check to see if handrails are loose or broken. Make sure that both sides of any steps have handrails.  Any raised decks and porches should have guardrails on the edges.  Have any leaves, snow, or ice cleared regularly.  Use sand or salt on walking paths during winter.  Clean up any spills in your garage right away. This includes oil or grease spills. What can I do in the bathroom?  Use night lights.  Install grab bars by the toilet and in the tub and shower. Do not use towel bars as grab bars.  Use non-skid mats or decals in the tub or shower.  If you need to sit down in the shower, use a plastic, non-slip stool.  Keep the floor dry. Clean up any water that spills on the floor as soon  as it happens.  Remove soap buildup in the tub or shower regularly.  Attach bath mats securely with double-sided non-slip rug tape.  Do not have throw rugs and other things on the floor that can make you trip. What can I do in the bedroom?  Use night lights.  Make sure that you have a light by your bed that is easy to reach.  Do not use any sheets or blankets that are too big for your bed. They should not hang down onto the floor.  Have a firm chair that has side arms. You can use this for support while you get dressed.  Do not have throw rugs and other things on the floor that can make you trip. What can I do in the kitchen?  Clean up any spills right away.  Avoid walking on wet floors.  Keep items that you use a lot in easy-to-reach places.  If you need to reach something above you, use a strong step stool that has a grab bar.  Keep electrical cords out of the way.  Do not use floor polish or wax that makes floors slippery. If you must use wax, use non-skid floor wax.  Do not have throw rugs and other things on the floor that can make you trip. What can I do with my stairs?  Do not leave any items on the stairs.  Make sure that there are handrails on both sides of the stairs and use them. Fix handrails that are broken or loose. Make sure that handrails are as long as the stairways.  Check any carpeting to make sure that it is firmly attached to the stairs. Fix any carpet that is loose or worn.  Avoid having throw rugs at the top or bottom of the stairs. If you do have throw rugs, attach them to the floor with carpet tape.  Make sure that you have a light switch at the top of the stairs and the bottom of the stairs. If you do not have them, ask someone to add them for you. What else can I do to help prevent falls?  Wear shoes that:  Do not have high heels.  Have rubber bottoms.  Are comfortable and fit you well.  Are closed at the toe. Do not wear sandals.  If  you use a stepladder:  Make sure that it is fully opened. Do not climb a closed stepladder.  Make sure that both sides of the stepladder are locked into place.  Ask someone to hold it for you, if possible.  Clearly mark and make sure that you can see:  Any grab bars or handrails.  First and last steps.  Where the edge of each step is.  Use tools that help you move around (mobility aids) if they are needed. These include:  Canes.  Walkers.  Scooters.  Crutches.  Turn on the lights when you go into a dark area. Replace any light bulbs as soon as they burn out.  Set up your furniture so you have a clear path. Avoid moving your furniture around.  If any of your floors are uneven, fix them.  If there are any pets around you, be aware of where they are.  Review your medicines with your doctor. Some medicines can make you feel dizzy. This can increase your chance of falling. Ask your doctor what other things that you can do to help prevent falls. This information is not intended to replace advice given to you by your health care provider. Make sure you discuss any questions you have with your health care provider. Document Released: 11/03/2008 Document Revised: 06/15/2015 Document Reviewed: 02/11/2014 Elsevier Interactive Patient Education  2017 [employer].

## 2020-02-23 NOTE — Progress Notes (Addendum)
I,[name],acting as a scribe for Megan Mansichard  Jr, MD.,have documented all relevant documentation on the behalf of Megan MansRichard  Jr, MD,as directed by  Megan Mansichard  Jr, MD while in the presence of Megan Mansichard  Jr, MD.   Complete physical exam   Patient: Patrick Thornton   DOB: 10/26/1940   80 y.o. Male  MRN: [id number] Visit Date: 02/24/2020  Today's healthcare provider: Megan Mansichard  Jr, MD   Chief Complaint  Patient presents with  . Medicare Wellness   Subjective    Patrick Thornton is a 80 y.o. male who presents today for a complete physical exam.  He reports consuming a general and low sodium diet. The patient does not participate in regular exercise at present. He generally feels fairly well. He reports sleeping poorly. He does not have additional problems to discuss today.  HPI  Does have chronic low back pain.  He has appoint with dermatology the end of this month.  Otherwise he is feeling fairly well. Patient had AWV with [facility] on 02/03/2020.  Past Medical History:  Diagnosis Date  . Arthritis   . Collar bone fracture    x 2  . Colon polyp 2003  . Dysrhythmia    occasionally skips a beat and has bradycardia(in the30's today)  . Edema    legs/feet occas  . History of hiatal hernia   . Hypertension   . Renal mass, right   . Sleep apnea    20 years ago and did not wear cpap long   Past Surgical History:  Procedure Laterality Date  . BUNIONECTOMY WITH HAMMERTOE RECONSTRUCTION Left 2011  . CHOLECYSTECTOMY  1970  . CIRCUMCISION N/A 01/09/2016   Procedure: CIRCUMCISION ADULT;  Surgeon: Vanna ScotlandAshley Brandon, MD;  Location: [facility] ORS;  Service: Urology;  Laterality: N/A;  . COLONOSCOPY  03-22-08   Dr Lemar LivingsByrnett  . FOOT SURGERY Left    x 2 pins  . FRACTURE SURGERY Left 1970   pins in left big toe and first toe  . HERNIA REPAIR  1970  . OTHER SURGICAL HISTORY     transanal excision of a villous adenoma   Social History   Socioeconomic History  . Marital  status: Widowed    Spouse name: Nettie ElmSylvia  . Number of children: 4  . Years of education: Not on file  . Highest education level: Some college, no degree  Occupational History  . Occupation: retired  Tobacco Use  . Smoking status: Former Smoker    Years: 40.00    Quit date: 01/04/1994    Years since quitting: 26.1  . Smokeless tobacco: Never Used  Vaping Use  . Vaping Use: Never used  Substance and Sexual Activity  . Alcohol use: No  . Drug use: No  . Sexual activity: Never  Other Topics Concern  . Not on file  Social History Narrative  . Not on file   Social Determinants of Health   Financial Resource Strain: Low Risk   . Difficulty of Paying Living Expenses: Not hard at all  Food Insecurity: No Food Insecurity  . Worried About Programme researcher, broadcasting/film/videounning Out of Food in the Last Year: Never true  . Ran Out of Food in the Last Year: Never true  Transportation Needs: No Transportation Needs  . Lack of Transportation (Medical): No  . Lack of Transportation (Non-Medical): No  Physical Activity: Inactive  . Days of Exercise per Week: 0 days  . Minutes of Exercise per Session: 0 min  Stress: No Stress Concern  Present  . Feeling of Stress : Not at all  Social Connections: Moderately Isolated  . Frequency of Communication with Friends and Family: More than three times a week  . Frequency of Social Gatherings with Friends and Family: Twice a week  . Attends Religious Services: Never  . Active Member of Clubs or Organizations: Yes  . Attends Archivist Meetings: Never  . Marital Status: Widowed  Intimate Partner Violence: Not At Risk  . Fear of Current or Ex-Partner: No  . Emotionally Abused: No  . Physically Abused: No  . Sexually Abused: No   Family Status  Relation Name Status  . Mother  Deceased  . Father  Deceased at age 21  . Sister  Alive       adopted sister  . Brother  Alive  . Son  Alive  . Brother  Alive  . Daughter  Alive  . Daughter  Alive       heart issues    Family History  Problem Relation Age of Onset  . Stroke Mother   . Cancer Father        melanoma  . Diabetes Father   . Hypertension Father   . Heart attack Father   . Hyperlipidemia Brother   . Heart attack Brother   . Diabetes Son   . COPD Son   . Obesity Son   . Heart attack Son   . Hepatitis C Son   . Cirrhosis Son   . Kidney failure Son   . CAD Brother   . Diverticulitis Daughter   . Heart disease Daughter   . Heart attack Daughter    Allergies  Allergen Reactions  . Tape Rash    Blisters; skin tore off when patient took off electrodes from wearing a holter monitor     Patient Care Team: Jerrol Banana., MD as PCP - General (Family Medicine) Bary Castilla, Forest Gleason, MD (General Surgery) Birder Robson, MD as Referring Physician (Ophthalmology) Dasher, Rayvon Char, MD (Dermatology) Teodoro Spray, MD as Consulting Physician (Cardiology) Abbie Sons, MD (Urology)   Medications: Outpatient Medications Prior to Visit  Medication Sig  . amLODipine (NORVASC) 2.5 MG tablet Take 2.5 mg by mouth daily.  . clobetasol ointment (TEMOVATE) AB-123456789 % Apply 1 application topically. As needed  . doxazosin (CARDURA) 8 MG tablet TAKE 1 TABLET BY MOUTH ONCE A DAY  . furosemide (LASIX) 20 MG tablet TAKE 1 TABLET BY MOUTH ONCE DAILY  . Multiple Vitamin tablet Take 1 tablet by mouth daily.   . quinapril (ACCUPRIL) 20 MG tablet TAKE 1 TABLET BY MOUTH ONCE DAILY  . cyanocobalamin 1000 MCG tablet Take 1,000 mcg by mouth daily.  (Patient not taking: No sig reported)  . cyclobenzaprine (FLEXERIL) 10 MG tablet Take 1 tablet (10 mg total) by mouth at bedtime. (Patient not taking: No sig reported)  . diazepam (VALIUM) 5 MG tablet Take 1 tablet (5 mg total) by mouth every 12 (twelve) hours as needed for anxiety. Take 1 hour before MRI, may repeat 30 minutes before MRI if not calm. (Patient not taking: Reported on 02/24/2020)  . docusate sodium (COLACE) 100 MG capsule Take 1 capsule (100 mg  total) by mouth 2 (two) times daily. (Patient not taking: No sig reported)  . ibuprofen (ADVIL,MOTRIN) 800 MG tablet TAKE 1 TABLET BY MOUTH TWICE A DAY AS NEEDED (Patient not taking: No sig reported)  . sildenafil (REVATIO) 20 MG tablet Take 1-5 tablets by mouth daily. (Patient  not taking: No sig reported)  . vitamin E 180 MG (400 UNITS) capsule Take 400 Units by mouth daily. (Patient not taking: No sig reported)   No facility-administered medications prior to visit.    Review of Systems  HENT: Positive for hearing loss and tinnitus.   Musculoskeletal: Positive for back pain.  Hematological: Bruises/bleeds easily.  All other systems reviewed and are negative.      Objective    BP (!) 143/77 (BP Location: Right Arm, Patient Position: Sitting, Cuff Size: Large)   Pulse 73   Temp 98.4 F (36.9 C) (Oral)   Resp 18   Ht 5\' 10"  (1.778 m)   Wt 276 lb (125.2 kg)   SpO2 97%   BMI 39.60 kg/m  BP Readings from Last 3 Encounters:  02/24/20 (!) 143/77  02/02/20 (!) 181/76  08/24/19 131/74   Wt Readings from Last 3 Encounters:  02/24/20 276 lb (125.2 kg)  02/02/20 272 lb (123.4 kg)  08/24/19 275 lb 6.4 oz (124.9 kg)      Physical Exam Vitals reviewed.  Constitutional:      Appearance: Normal appearance. He is obese.  HENT:     Head: Normocephalic and atraumatic.     Right Ear: Tympanic membrane normal.     Left Ear: Tympanic membrane normal.     Nose: Nose normal.     Mouth/Throat:     Mouth: Mucous membranes are moist.     Pharynx: Oropharynx is clear.  Eyes:     Extraocular Movements: Extraocular movements intact.     Conjunctiva/sclera: Conjunctivae normal.     Pupils: Pupils are equal, round, and reactive to light.  Cardiovascular:     Rate and Rhythm: Normal rate and regular rhythm.     Pulses: Normal pulses.     Heart sounds: Normal heart sounds.  Pulmonary:     Effort: Pulmonary effort is normal.     Breath sounds: Normal breath sounds.  Abdominal:      General: Abdomen is flat. Bowel sounds are normal.     Palpations: Abdomen is soft.  Musculoskeletal:        General: Normal range of motion.     Cervical back: Normal range of motion and neck supple.  Skin:    General: Skin is warm and dry.  Neurological:     General: No focal deficit present.     Mental Status: He is alert and oriented to person, place, and time.  Psychiatric:        Mood and Affect: Mood normal.        Behavior: Behavior normal.        Thought Content: Thought content normal.        Judgment: Judgment normal.       Last depression screening scores PHQ 2/9 Scores 02/03/2020 12/27/2019 02/01/2019  PHQ - 2 Score 0 0 1  PHQ- 9 Score - 4 -   Last fall risk screening Fall Risk  02/03/2020  Falls in the past year? 0  Number falls in past yr: 0  Injury with Fall? 0  Follow up -   Last Audit-C alcohol use screening Alcohol Use Disorder Test (AUDIT) 02/03/2020  1. How often do you have a drink containing alcohol? 0  2. How many drinks containing alcohol do you have on a typical day when you are drinking? 0  3. How often do you have six or more drinks on one occasion? 0  AUDIT-C Score 0  Alcohol Brief Interventions/Follow-up AUDIT  Score <7 follow-up not indicated   A score of 3 or more in women, and 4 or more in men indicates increased risk for alcohol abuse, EXCEPT if all of the points are from question 1   No results found for any visits on 02/24/20.  Assessment & Plan    Routine Health Maintenance and Physical Exam  Exercise Activities and Dietary recommendations Goals    . Exercise 3x per week (30 min per time)     Recommend increasing exercise. Pt to start walking 3 times a week for 30 minutes.        Immunization History  Administered Date(s) Administered  . Fluad Quad(high Dose 65+) 11/25/2018  . Influenza Inj Mdck Quad Pf 12/09/2016  . Influenza, High Dose Seasonal PF 11/01/2014, 10/10/2015  . Influenza-Unspecified 10/21/2013, 10/21/2019  .  PFIZER(Purple Top)SARS-COV-2 Vaccination 02/15/2019, 03/15/2019, 09/24/2019  . Pneumococcal Conjugate-13 10/27/2013  . Pneumococcal Polysaccharide-23 07/30/2011  . Td 11/25/2018    Health Maintenance  Topic Date Due  . TETANUS/TDAP  11/24/2028  . INFLUENZA VACCINE  Completed  . COVID-19 Vaccine  Completed  . PNA vac Low Risk Adult  Completed    Discussed health benefits of physical activity, and encouraged him to engage in regular exercise appropriate for his age and condition.  1. Annual physical exam  - Lipid panel - TSH - CBC w/Diff/Platelet - Comprehensive Metabolic Panel (CMET)  2. Essential hypertension  - Lipid panel - TSH - CBC w/Diff/Platelet - Comprehensive Metabolic Panel (CMET)  3. Hyperlipidemia, unspecified hyperlipidemia type  - Lipid panel - TSH - CBC w/Diff/Platelet - Comprehensive Metabolic Panel (CMET)  4. Hypothyroidism, unspecified type  - Lipid panel - TSH - CBC w/Diff/Platelet - Comprehensive Metabolic Panel (CMET)  5. Chronic left-sided low back pain without sciatica Try turmeric daily - Lipid panel - TSH - CBC w/Diff/Platelet - Comprehensive Metabolic Panel (CMET)   Return in about 6 months (around 08/23/2020).     I, [name], MD, have reviewed all documentation for this visit. The documentation on 03/03/20 for the exam, diagnosis, procedures, and orders are all accurate and complete.     Cranford Mon, MD  [facility] [phone] (phone) [phone] (fax)  [place]

## 2020-02-24 ENCOUNTER — Encounter: Payer: Self-pay | Admitting: Family Medicine

## 2020-02-24 ENCOUNTER — Ambulatory Visit (INDEPENDENT_AMBULATORY_CARE_PROVIDER_SITE_OTHER): Payer: PPO | Admitting: Family Medicine

## 2020-02-24 ENCOUNTER — Other Ambulatory Visit: Payer: Self-pay

## 2020-02-24 VITALS — BP 143/77 | HR 73 | Temp 98.4°F | Resp 18 | Ht 70.0 in | Wt 276.0 lb

## 2020-02-24 DIAGNOSIS — M545 Low back pain, unspecified: Secondary | ICD-10-CM | POA: Diagnosis not present

## 2020-02-24 DIAGNOSIS — E039 Hypothyroidism, unspecified: Secondary | ICD-10-CM | POA: Diagnosis not present

## 2020-02-24 DIAGNOSIS — Z0000 Encounter for general adult medical examination without abnormal findings: Secondary | ICD-10-CM

## 2020-02-24 DIAGNOSIS — G8929 Other chronic pain: Secondary | ICD-10-CM | POA: Diagnosis not present

## 2020-02-24 DIAGNOSIS — E785 Hyperlipidemia, unspecified: Secondary | ICD-10-CM | POA: Diagnosis not present

## 2020-02-24 DIAGNOSIS — I10 Essential (primary) hypertension: Secondary | ICD-10-CM

## 2020-02-24 NOTE — Patient Instructions (Signed)
Try over-the-counter Turmeric daily. 

## 2020-02-25 LAB — COMPREHENSIVE METABOLIC PANEL
ALT: 19 IU/L (ref 0–44)
AST: 18 IU/L (ref 0–40)
Albumin/Globulin Ratio: 1.7 (ref 1.2–2.2)
Albumin: 3.9 g/dL (ref 3.7–4.7)
Alkaline Phosphatase: 96 IU/L (ref 44–121)
BUN/Creatinine Ratio: 12 (ref 10–24)
BUN: 12 mg/dL (ref 8–27)
Bilirubin Total: 0.9 mg/dL (ref 0.0–1.2)
CO2: 26 mmol/L (ref 20–29)
Calcium: 8.7 mg/dL (ref 8.6–10.2)
Chloride: 104 mmol/L (ref 96–106)
Creatinine, Ser: 0.99 mg/dL (ref 0.76–1.27)
GFR calc Af Amer: 84 mL/min/{1.73_m2} (ref >59)
GFR calc non Af Amer: 73 mL/min/{1.73_m2} (ref >59)
Globulin, Total: 2.3 g/dL (ref 1.5–4.5)
Glucose: 98 mg/dL (ref 65–99)
Potassium: 4.4 mmol/L (ref 3.5–5.2)
Sodium: 143 mmol/L (ref 134–144)
Total Protein: 6.2 g/dL (ref 6.0–8.5)

## 2020-02-25 LAB — LIPID PANEL
Chol/HDL Ratio: 3.5 ratio (ref 0.0–5.0)
Cholesterol, Total: 151 mg/dL (ref 100–199)
HDL: 43 mg/dL (ref >39)
LDL Chol Calc (NIH): 99 mg/dL (ref 0–99)
Triglycerides: 40 mg/dL (ref 0–149)
VLDL Cholesterol Cal: 9 mg/dL (ref 5–40)

## 2020-02-25 LAB — CBC WITH DIFFERENTIAL/PLATELET
Basophils Absolute: 0.1 10*3/uL (ref 0.0–0.2)
Basos: 1 %
EOS (ABSOLUTE): 0.1 10*3/uL (ref 0.0–0.4)
Eos: 2 %
Hematocrit: 40.5 % (ref 37.5–51.0)
Hemoglobin: 13.8 g/dL (ref 13.0–17.7)
Immature Grans (Abs): 0 10*3/uL (ref 0.0–0.1)
Immature Granulocytes: 0 %
Lymphocytes Absolute: 1.4 10*3/uL (ref 0.7–3.1)
Lymphs: 24 %
MCH: 31.6 pg (ref 26.6–33.0)
MCHC: 34.1 g/dL (ref 31.5–35.7)
MCV: 93 fL (ref 79–97)
Monocytes Absolute: 0.5 10*3/uL (ref 0.1–0.9)
Monocytes: 8 %
Neutrophils Absolute: 3.8 10*3/uL (ref 1.4–7.0)
Neutrophils: 65 %
Platelets: 243 10*3/uL (ref 150–450)
RBC: 4.37 x10E6/uL (ref 4.14–5.80)
RDW: 12.4 % (ref 11.6–15.4)
WBC: 5.8 10*3/uL (ref 3.4–10.8)

## 2020-02-25 LAB — TSH: TSH: 5.03 u[IU]/mL — ABNORMAL HIGH (ref 0.450–4.500)

## 2020-03-20 DIAGNOSIS — Z85828 Personal history of other malignant neoplasm of skin: Secondary | ICD-10-CM | POA: Diagnosis not present

## 2020-03-20 DIAGNOSIS — D225 Melanocytic nevi of trunk: Secondary | ICD-10-CM | POA: Diagnosis not present

## 2020-03-20 DIAGNOSIS — L570 Actinic keratosis: Secondary | ICD-10-CM | POA: Diagnosis not present

## 2020-03-20 DIAGNOSIS — D2271 Melanocytic nevi of right lower limb, including hip: Secondary | ICD-10-CM | POA: Diagnosis not present

## 2020-03-20 DIAGNOSIS — X32XXXA Exposure to sunlight, initial encounter: Secondary | ICD-10-CM | POA: Diagnosis not present

## 2020-03-20 DIAGNOSIS — L900 Lichen sclerosus et atrophicus: Secondary | ICD-10-CM | POA: Diagnosis not present

## 2020-03-20 DIAGNOSIS — D2261 Melanocytic nevi of right upper limb, including shoulder: Secondary | ICD-10-CM | POA: Diagnosis not present

## 2020-03-20 DIAGNOSIS — L821 Other seborrheic keratosis: Secondary | ICD-10-CM | POA: Diagnosis not present

## 2020-03-31 DIAGNOSIS — I272 Pulmonary hypertension, unspecified: Secondary | ICD-10-CM | POA: Diagnosis not present

## 2020-03-31 DIAGNOSIS — E782 Mixed hyperlipidemia: Secondary | ICD-10-CM | POA: Diagnosis not present

## 2020-03-31 DIAGNOSIS — I493 Ventricular premature depolarization: Secondary | ICD-10-CM | POA: Diagnosis not present

## 2020-03-31 DIAGNOSIS — I119 Hypertensive heart disease without heart failure: Secondary | ICD-10-CM | POA: Diagnosis not present

## 2020-03-31 DIAGNOSIS — I10 Essential (primary) hypertension: Secondary | ICD-10-CM | POA: Diagnosis not present

## 2020-03-31 DIAGNOSIS — G4733 Obstructive sleep apnea (adult) (pediatric): Secondary | ICD-10-CM | POA: Diagnosis not present

## 2020-03-31 DIAGNOSIS — I071 Rheumatic tricuspid insufficiency: Secondary | ICD-10-CM | POA: Diagnosis not present

## 2020-03-31 DIAGNOSIS — I340 Nonrheumatic mitral (valve) insufficiency: Secondary | ICD-10-CM | POA: Diagnosis not present

## 2020-05-01 ENCOUNTER — Other Ambulatory Visit: Payer: Self-pay | Admitting: Family Medicine

## 2020-06-16 ENCOUNTER — Other Ambulatory Visit: Payer: Self-pay | Admitting: Family Medicine

## 2020-06-16 NOTE — Telephone Encounter (Signed)
Requested medication (s) are due for refill today: Yes  Requested medication (s) are on the active medication list: Yes  Last refill:  05/2119  Future visit scheduled: Yes  Notes to clinic:  Prescription expired.    Requested Prescriptions  Pending Prescriptions Disp Refills   quinapril (ACCUPRIL) 20 MG tablet [Pharmacy Med Name: QUINAPRIL HCL 20 MG TAB] 90 tablet 3    Sig: TAKE 1 TABLET BY MOUTH ONCE DAILY      Cardiovascular:  ACE Inhibitors Failed - 06/16/2020 12:14 PM      Failed - Last BP in normal range    BP Readings from Last 1 Encounters:  02/24/20 (!) 143/77          Passed - Cr in normal range and within 180 days    Creatinine, Ser  Date Value Ref Range Status  02/24/2020 0.99 0.76 - 1.27 mg/dL Final          Passed - K in normal range and within 180 days    Potassium  Date Value Ref Range Status  02/24/2020 4.4 3.5 - 5.2 mmol/L Final          Passed - Patient is not pregnant      Passed - Valid encounter within last 6 months    Recent Outpatient Visits           3 months ago Annual physical exam   [facility] Jerrol Banana., MD   5 months ago Acute right-sided low back pain without sciatica   [facility] Jerrol Banana., MD   9 months ago Essential hypertension   [facility] Jerrol Banana., MD   1 year ago Annual physical exam   [facility] Jerrol Banana., MD   1 year ago Essential hypertension   [facility] Jerrol Banana., MD       Future Appointments             In 2 weeks Stoioff, Ronda Fairly, MD [place]   In 2 months Jerrol Banana., MD [facility] [place]

## 2020-06-26 ENCOUNTER — Ambulatory Visit
Admission: RE | Admit: 2020-06-26 | Discharge: 2020-06-26 | Disposition: A | Discharge status: Home / Self Care | Payer: PPO | Source: Ambulatory Visit | Attending: Urology | Admitting: Urology

## 2020-06-26 ENCOUNTER — Other Ambulatory Visit: Payer: Self-pay

## 2020-06-26 DIAGNOSIS — N2889 Other specified disorders of kidney and ureter: Secondary | ICD-10-CM | POA: Diagnosis not present

## 2020-06-26 DIAGNOSIS — N281 Cyst of kidney, acquired: Secondary | ICD-10-CM | POA: Diagnosis not present

## 2020-07-03 ENCOUNTER — Ambulatory Visit: Payer: PPO | Admitting: Urology

## 2020-07-03 ENCOUNTER — Encounter: Payer: Self-pay | Admitting: Urology

## 2020-07-03 ENCOUNTER — Other Ambulatory Visit: Payer: Self-pay

## 2020-07-03 VITALS — BP 152/79 | HR 78 | Ht 71.0 in | Wt 265.0 lb

## 2020-07-03 DIAGNOSIS — N2889 Other specified disorders of kidney and ureter: Secondary | ICD-10-CM

## 2020-07-03 NOTE — Progress Notes (Signed)
07/03/2020 10:06 AM   [name] 1940/07/18 [id number]  Referring provider: Jerrol Banana., MD [address] [place],  [place] [address]  Chief Complaint  Patient presents with   Other    Urologic history: 1.  Renal mass 3.2 x 2.3 x 3.0 cm enhancing right renal mass (MRI 12/2019) Elected active surveillance   HPI: 80 y.o. male presents for follow-up.  He presents today with his daughter  No problems since last visit Denies flank, abdominal or pelvic pain Denies dysuria, gross hematuria Renal ultrasound performed 06/26/2020 remarkable for 3.1 x 3.0 x 2.3 cm right renal mass; bilateral renal cysts present   PMH: Past Medical History:  Diagnosis Date   Arthritis    Collar bone fracture    x 2   Colon polyp 2003   Dysrhythmia    occasionally skips a beat and has bradycardia(in the30's today)   Edema    legs/feet occas   History of hiatal hernia    Hypertension    Renal mass, right    Sleep apnea    20 years ago and did not wear cpap long    Surgical History: Past Surgical History:  Procedure Laterality Date   BUNIONECTOMY WITH HAMMERTOE RECONSTRUCTION Left 2011   CHOLECYSTECTOMY  1970   CIRCUMCISION N/A 01/09/2016   Procedure: CIRCUMCISION ADULT;  Surgeon: Hollice Espy, MD;  Location: [facility] ORS;  Service: Urology;  Laterality: N/A;   COLONOSCOPY  03-22-08   Dr Bary Castilla   FOOT SURGERY Left    x 2 pins   FRACTURE SURGERY Left 1970   pins in left big toe and first toe   HERNIA REPAIR  1970   OTHER SURGICAL HISTORY     transanal excision of a villous adenoma    Home Medications:  Allergies as of 07/03/2020       Reactions   Tape Rash   Blisters; skin tore off when patient took off electrodes from wearing a holter monitor         Medication List        Accurate as of July 03, 2020 10:06 AM. If you have any questions, ask your nurse or doctor.          amLODipine 2.5 MG tablet Commonly known as: NORVASC Take 2.5 mg by  mouth daily.   clobetasol ointment 0.05 % Commonly known as: TEMOVATE Apply 1 application topically. As needed   cyanocobalamin 1000 MCG tablet Take 1,000 mcg by mouth daily.   cyclobenzaprine 10 MG tablet Commonly known as: FLEXERIL Take 1 tablet (10 mg total) by mouth at bedtime.   diazepam 5 MG tablet Commonly known as: VALIUM Take 1 tablet (5 mg total) by mouth every 12 (twelve) hours as needed for anxiety. Take 1 hour before MRI, may repeat 30 minutes before MRI if not calm.   docusate sodium 100 MG capsule Commonly known as: COLACE Take 1 capsule (100 mg total) by mouth 2 (two) times daily.   doxazosin 8 MG tablet Commonly known as: CARDURA TAKE 1 TABLET BY MOUTH ONCE A DAY   furosemide 20 MG tablet Commonly known as: LASIX TAKE 1 TABLET BY MOUTH ONCE DAILY   ibuprofen 800 MG tablet Commonly known as: ADVIL TAKE 1 TABLET BY MOUTH TWICE A DAY AS NEEDED   Multiple Vitamin tablet Take 1 tablet by mouth daily.   quinapril 20 MG tablet Commonly known as: ACCUPRIL TAKE 1 TABLET BY MOUTH ONCE DAILY   sildenafil 20 MG tablet  Commonly known as: REVATIO Take 1-5 tablets by mouth daily.   vitamin E 180 MG (400 UNITS) capsule Take 400 Units by mouth daily.        Allergies:  Allergies  Allergen Reactions   Tape Rash    Blisters; skin tore off when patient took off electrodes from wearing a holter monitor     Family History: Family History  Problem Relation Age of Onset   Stroke Mother    Cancer Father        melanoma   Diabetes Father    Hypertension Father    Heart attack Father    Hyperlipidemia Brother    Heart attack Brother    Diabetes Son    COPD Son    Obesity Son    Heart attack Son    Hepatitis C Son    Cirrhosis Son    Kidney failure Son    CAD Brother    Diverticulitis Daughter    Heart disease Daughter    Heart attack Daughter     Social History:  reports that he quit smoking about 26 years ago. His smoking use included  cigarettes. He has never used smokeless tobacco. He reports that he does not drink alcohol and does not use drugs.   Physical Exam: BP (!) 152/79   Pulse 78   Ht 5\' 11"  (1.803 m)   Wt 265 lb (120.2 kg)   BMI 36.96 kg/m   Constitutional:  Alert and oriented, No acute distress. HEENT: [place] AT, moist mucus membranes.  Trachea midline, no masses. Cardiovascular: No clubbing, cyanosis, or edema. Respiratory: Normal respiratory effort, no increased work of breathing. Psychiatric: Normal mood and affect.   Pertinent Imaging:  Ultrasound images were personally reviewed and interpreted  Ultrasound renal complete  Narrative CLINICAL DATA:  Right renal mass.  EXAM: RENAL / URINARY TRACT ULTRASOUND COMPLETE  COMPARISON:  MRI 01/19/2020  FINDINGS: Right Kidney:  Renal measurements: 16.4 x 7.6 x 6.8 cm = volume: 443 mL. Multiple cysts noted in the right kidney with 3.1 x 3.0 x 2.3 cm solid mass in the interpolar region, compatible with lesion documented on previous MRI. No evidence for hydronephrosis.  Left Kidney:  Renal measurements: 14.9 x 7.1 x 6.2 cm = volume: 345 mL. Multiple cysts evident. No hydronephrosis.  Bladder:  Appears normal for degree of bladder distention.  Other:  None.  IMPRESSION: 1. 3.1 cm solid exophytic mass interpolar right kidney, compatible with description of renal cell carcinoma on previous MRI. 2. Bilateral renal cysts.   Electronically Signed By: Misty Stanley M.D. On: 06/27/2020 10:08    Assessment & Plan:    1.  Right renal mass Stable without interval size growth I again reviewed options of continued surveillance, renal mass biopsy, percutaneous ablation and surgery He has elected to continue surveillance and recommend a follow-up appointment in 6 months with renal ultrasound   Abbie Sons, MD  [place] [address] [place], [place] [address] [phone]

## 2020-08-23 ENCOUNTER — Ambulatory Visit: Payer: Self-pay | Admitting: Family Medicine

## 2020-08-30 ENCOUNTER — Encounter: Payer: Self-pay | Admitting: Family Medicine

## 2020-08-30 ENCOUNTER — Other Ambulatory Visit: Payer: Self-pay

## 2020-08-30 ENCOUNTER — Ambulatory Visit (INDEPENDENT_AMBULATORY_CARE_PROVIDER_SITE_OTHER): Payer: PPO | Admitting: Family Medicine

## 2020-08-30 VITALS — BP 126/73 | HR 81 | Temp 98.1°F | Resp 16 | Wt 269.0 lb

## 2020-08-30 DIAGNOSIS — G4733 Obstructive sleep apnea (adult) (pediatric): Secondary | ICD-10-CM | POA: Diagnosis not present

## 2020-08-30 DIAGNOSIS — E785 Hyperlipidemia, unspecified: Secondary | ICD-10-CM

## 2020-08-30 DIAGNOSIS — M158 Other polyosteoarthritis: Secondary | ICD-10-CM | POA: Diagnosis not present

## 2020-08-30 DIAGNOSIS — N2889 Other specified disorders of kidney and ureter: Secondary | ICD-10-CM

## 2020-08-30 DIAGNOSIS — I10 Essential (primary) hypertension: Secondary | ICD-10-CM

## 2020-08-30 DIAGNOSIS — Z6841 Body Mass Index (BMI) 40.0 and over, adult: Secondary | ICD-10-CM | POA: Diagnosis not present

## 2020-08-30 NOTE — Progress Notes (Signed)
Established patient visit   Patient: Patrick Thornton   DOB: 1940-05-07   80 y.o. Male  MRN: [id number] Visit Date: 08/30/2020  Today's healthcare provider: [name], MD   Chief Complaint  Patient presents with   Hypertension   Subjective    HPI  Patient overall is doing well.  Living alone but has no complaints.  Right renal mass being followed by urology.  Evidently has been stable for him.  No complaints today. Hypertension, follow-up  BP Readings from Last 3 Encounters:  08/30/20 126/73  07/03/20 (!) 152/79  02/24/20 (!) 143/77   Wt Readings from Last 3 Encounters:  08/30/20 269 lb (122 kg)  07/03/20 265 lb (120.2 kg)  02/24/20 276 lb (125.2 kg)     He was last seen for hypertension 6 months ago.  BP at that visit was 143/77. Management since that visit includes; on quinapril and amlodipine. He reports good compliance with treatment. He is not having side effects.  He is not exercising. He is adherent to low salt diet.   Outside blood pressures are checked occasionally.  He does not smoke.  Use of agents associated with hypertension: none.         Medications: Outpatient Medications Prior to Visit  Medication Sig   quinapril (ACCUPRIL) 20 MG tablet TAKE 1 TABLET BY MOUTH ONCE DAILY   amLODipine (NORVASC) 2.5 MG tablet Take 2.5 mg by mouth daily.   clobetasol ointment (TEMOVATE) AB-123456789 % Apply 1 application topically. As needed   cyanocobalamin 1000 MCG tablet Take 1,000 mcg by mouth daily.  (Patient not taking: No sig reported)   cyclobenzaprine (FLEXERIL) 10 MG tablet Take 1 tablet (10 mg total) by mouth at bedtime. (Patient not taking: No sig reported)   diazepam (VALIUM) 5 MG tablet Take 1 tablet (5 mg total) by mouth every 12 (twelve) hours as needed for anxiety. Take 1 hour before MRI, may repeat 30 minutes before MRI if not calm. (Patient not taking: Reported on 02/24/2020)   docusate sodium (COLACE) 100 MG capsule Take 1 capsule (100 mg  total) by mouth 2 (two) times daily. (Patient not taking: No sig reported)   doxazosin (CARDURA) 8 MG tablet TAKE 1 TABLET BY MOUTH ONCE A DAY   furosemide (LASIX) 20 MG tablet TAKE 1 TABLET BY MOUTH ONCE DAILY   ibuprofen (ADVIL,MOTRIN) 800 MG tablet TAKE 1 TABLET BY MOUTH TWICE A DAY AS NEEDED (Patient not taking: No sig reported)   Multiple Vitamin tablet Take 1 tablet by mouth daily.    sildenafil (REVATIO) 20 MG tablet Take 1-5 tablets by mouth daily. (Patient not taking: No sig reported)   vitamin E 180 MG (400 UNITS) capsule Take 400 Units by mouth daily. (Patient not taking: No sig reported)   No facility-administered medications prior to visit.    Review of Systems      Objective    BP 126/73   Pulse 81   Temp 98.1 F (36.7 C)   Resp 16   Wt 269 lb (122 kg)   BMI 37.52 kg/m  BP Readings from Last 3 Encounters:  08/30/20 126/73  07/03/20 (!) 152/79  02/24/20 (!) 143/77   Wt Readings from Last 3 Encounters:  08/30/20 269 lb (122 kg)  07/03/20 265 lb (120.2 kg)  02/24/20 276 lb (125.2 kg)       Physical Exam Vitals reviewed.  Constitutional:      Appearance: Normal appearance. He is obese.  HENT:  Head: Normocephalic and atraumatic.     Right Ear: Tympanic membrane normal.     Left Ear: Tympanic membrane normal.     Nose: Nose normal.     Mouth/Throat:     Mouth: Mucous membranes are moist.     Pharynx: Oropharynx is clear.  Eyes:     Extraocular Movements: Extraocular movements intact.     Conjunctiva/sclera: Conjunctivae normal.     Pupils: Pupils are equal, round, and reactive to light.  Cardiovascular:     Rate and Rhythm: Normal rate and regular rhythm.     Pulses: Normal pulses.     Heart sounds: Normal heart sounds.  Pulmonary:     Effort: Pulmonary effort is normal.     Breath sounds: Normal breath sounds.  Abdominal:     General: Abdomen is flat. Bowel sounds are normal.     Palpations: Abdomen is soft.  Musculoskeletal:        General:  Normal range of motion.     Cervical back: Normal range of motion and neck supple.  Skin:    General: Skin is warm and dry.  Neurological:     General: No focal deficit present.     Mental Status: He is alert and oriented to person, place, and time. Mental status is at baseline.  Psychiatric:        Mood and Affect: Mood normal.        Behavior: Behavior normal.        Thought Content: Thought content normal.        Judgment: Judgment normal.      No results found for any visits on 08/30/20.  Assessment & Plan     1. Essential hypertension Trolled on quinapril and amlodipine  2. OSA (obstructive sleep apnea)   3. Other osteoarthritis involving multiple joints   4. Hyperlipidemia, unspecified hyperlipidemia type   5. Class 3 severe obesity due to excess calories with serious comorbidity and body mass index (BMI) of 40.0 to 44.9 in adult ([place])   6. Renal mass, right By urology 7.  Proximal abdominal aortic aneurysm  Follow-up  dec 2023  No follow-ups on file.      I, [name], MD, have reviewed all documentation for this visit. The documentation on 09/08/20 for the exam, diagnosis, procedures, and orders are all accurate and complete.    Deontez Klinke Cranford Mon, MD  [facility] [phone] (phone) [phone] (fax)  [place]

## 2020-10-18 DIAGNOSIS — I10 Essential (primary) hypertension: Secondary | ICD-10-CM | POA: Diagnosis not present

## 2020-10-18 DIAGNOSIS — I340 Nonrheumatic mitral (valve) insufficiency: Secondary | ICD-10-CM | POA: Diagnosis not present

## 2020-10-18 DIAGNOSIS — I493 Ventricular premature depolarization: Secondary | ICD-10-CM | POA: Diagnosis not present

## 2020-10-18 DIAGNOSIS — E782 Mixed hyperlipidemia: Secondary | ICD-10-CM | POA: Diagnosis not present

## 2020-10-18 DIAGNOSIS — I272 Pulmonary hypertension, unspecified: Secondary | ICD-10-CM | POA: Diagnosis not present

## 2020-10-18 DIAGNOSIS — G4733 Obstructive sleep apnea (adult) (pediatric): Secondary | ICD-10-CM | POA: Diagnosis not present

## 2020-10-18 DIAGNOSIS — I071 Rheumatic tricuspid insufficiency: Secondary | ICD-10-CM | POA: Diagnosis not present

## 2020-10-18 DIAGNOSIS — I119 Hypertensive heart disease without heart failure: Secondary | ICD-10-CM | POA: Diagnosis not present

## 2020-12-05 ENCOUNTER — Other Ambulatory Visit: Payer: Self-pay | Admitting: Family Medicine

## 2020-12-06 DIAGNOSIS — H43813 Vitreous degeneration, bilateral: Secondary | ICD-10-CM | POA: Diagnosis not present

## 2020-12-12 ENCOUNTER — Other Ambulatory Visit: Payer: Self-pay | Admitting: Family Medicine

## 2020-12-12 NOTE — Telephone Encounter (Signed)
Requested medications are due for refill today.  yes  Requested medications are on the active medications list.  yes  Last refill. 06/16/2020  Future visit scheduled.   Yes - with nurse  Notes to clinic.  Failed protocol d/t expired labs

## 2020-12-18 DIAGNOSIS — D2262 Melanocytic nevi of left upper limb, including shoulder: Secondary | ICD-10-CM | POA: Diagnosis not present

## 2020-12-18 DIAGNOSIS — D225 Melanocytic nevi of trunk: Secondary | ICD-10-CM | POA: Diagnosis not present

## 2020-12-18 DIAGNOSIS — Z85828 Personal history of other malignant neoplasm of skin: Secondary | ICD-10-CM | POA: Diagnosis not present

## 2020-12-18 DIAGNOSIS — D2261 Melanocytic nevi of right upper limb, including shoulder: Secondary | ICD-10-CM | POA: Diagnosis not present

## 2020-12-18 DIAGNOSIS — X32XXXA Exposure to sunlight, initial encounter: Secondary | ICD-10-CM | POA: Diagnosis not present

## 2020-12-18 DIAGNOSIS — L570 Actinic keratosis: Secondary | ICD-10-CM | POA: Diagnosis not present

## 2021-01-01 ENCOUNTER — Other Ambulatory Visit: Payer: Self-pay

## 2021-01-01 ENCOUNTER — Ambulatory Visit
Admission: RE | Admit: 2021-01-01 | Discharge: 2021-01-01 | Disposition: A | Discharge status: Home / Self Care | Payer: PPO | Source: Ambulatory Visit | Attending: Urology | Admitting: Urology

## 2021-01-01 DIAGNOSIS — N2889 Other specified disorders of kidney and ureter: Secondary | ICD-10-CM

## 2021-01-01 DIAGNOSIS — N281 Cyst of kidney, acquired: Secondary | ICD-10-CM | POA: Diagnosis not present

## 2021-01-03 ENCOUNTER — Other Ambulatory Visit: Payer: Self-pay

## 2021-01-03 ENCOUNTER — Encounter: Payer: Self-pay | Admitting: Urology

## 2021-01-03 ENCOUNTER — Ambulatory Visit: Payer: PPO | Admitting: Urology

## 2021-01-03 VITALS — BP 173/82 | HR 72 | Ht 71.0 in | Wt 260.0 lb

## 2021-01-03 DIAGNOSIS — N2889 Other specified disorders of kidney and ureter: Secondary | ICD-10-CM | POA: Diagnosis not present

## 2021-01-03 NOTE — Progress Notes (Signed)
01/03/2021 11:20 AM   Patrick Thornton Sep 17, 1940 [id number]  Referring provider: Jerrol Banana., MD [address] [place],  [place] [address]  Chief Complaint  Patient presents with   Other    Urologic history: 1.  Renal mass 3.2 x 2.3 x 3.0 cm enhancing right renal mass (MRI 12/2019) Elected active surveillance   HPI: 80 y.o. male presents for 6 month follow-up of a right renal mass.  Doing well since last visit No bothersome LUTS Denies dysuria, gross hematuria Denies flank, abdominal or pelvic pain RUS 01/01/2021 with a stable 3 cm solid mass midportion of the right kidney  PMH: Past Medical History:  Diagnosis Date   Arthritis    Collar bone fracture    x 2   Colon polyp 2003   Dysrhythmia    occasionally skips a beat and has bradycardia(in the30's today)   Edema    legs/feet occas   History of hiatal hernia    Hypertension    Renal mass, right    Sleep apnea    20 years ago and did not wear cpap long    Surgical History: Past Surgical History:  Procedure Laterality Date   BUNIONECTOMY WITH HAMMERTOE RECONSTRUCTION Left 2011   CHOLECYSTECTOMY  1970   CIRCUMCISION N/A 01/09/2016   Procedure: CIRCUMCISION ADULT;  Surgeon: Hollice Espy, MD;  Location: [facility] ORS;  Service: Urology;  Laterality: N/A;   COLONOSCOPY  03-22-08   Dr Bary Castilla   FOOT SURGERY Left    x 2 pins   FRACTURE SURGERY Left 1970   pins in left big toe and first toe   HERNIA REPAIR  1970   OTHER SURGICAL HISTORY     transanal excision of a villous adenoma    Home Medications:  Allergies as of 01/03/2021       Reactions   Tape Rash   Blisters; skin tore off when patient took off electrodes from wearing a holter monitor         Medication List        Accurate as of January 03, 2021 11:20 AM. If you have any questions, ask your nurse or doctor.          amLODipine 2.5 MG tablet Commonly known as: NORVASC Take 2.5 mg by mouth daily.   clobetasol  ointment 0.05 % Commonly known as: TEMOVATE Apply 1 application topically. As needed   cyanocobalamin 1000 MCG tablet Take 1,000 mcg by mouth daily.   cyclobenzaprine 10 MG tablet Commonly known as: FLEXERIL Take 1 tablet (10 mg total) by mouth at bedtime.   diazepam 5 MG tablet Commonly known as: VALIUM Take 1 tablet (5 mg total) by mouth every 12 (twelve) hours as needed for anxiety. Take 1 hour before MRI, may repeat 30 minutes before MRI if not calm.   docusate sodium 100 MG capsule Commonly known as: COLACE Take 1 capsule (100 mg total) by mouth 2 (two) times daily.   doxazosin 8 MG tablet Commonly known as: CARDURA TAKE 1 TABLET BY MOUTH ONCE A DAY   furosemide 20 MG tablet Commonly known as: LASIX TAKE 1 TABLET BY MOUTH ONCE DAILY   ibuprofen 800 MG tablet Commonly known as: ADVIL TAKE 1 TABLET BY MOUTH TWICE A DAY AS NEEDED   Multiple Vitamin tablet Take 1 tablet by mouth daily.   quinapril 20 MG tablet Commonly known as: ACCUPRIL TAKE 1 TABLET BY MOUTH ONCE DAILY   sildenafil 20 MG tablet Commonly known as:  REVATIO Take 1-5 tablets by mouth daily.   vitamin E 180 MG (400 UNITS) capsule Take 400 Units by mouth daily.        Allergies:  Allergies  Allergen Reactions   Tape Rash    Blisters; skin tore off when patient took off electrodes from wearing a holter monitor     Family History: Family History  Problem Relation Age of Onset   Stroke Mother    Cancer Father        melanoma   Diabetes Father    Hypertension Father    Heart attack Father    Hyperlipidemia Brother    Heart attack Brother    Diabetes Son    COPD Son    Obesity Son    Heart attack Son    Hepatitis C Son    Cirrhosis Son    Kidney failure Son    CAD Brother    Diverticulitis Daughter    Heart disease Daughter    Heart attack Daughter     Social History:  reports that he quit smoking about 27 years ago. His smoking use included cigarettes. He has never used  smokeless tobacco. He reports that he does not drink alcohol and does not use drugs.   Physical Exam: BP (!) 173/82    Pulse 72    Ht 5\' 11"  (1.803 m)    Wt 260 lb (117.9 kg)    BMI 36.26 kg/m   Constitutional:  Alert and oriented, No acute distress. HEENT: [place] AT, moist mucus membranes.  Trachea midline, no masses. Cardiovascular: No clubbing, cyanosis, or edema. Respiratory: Normal respiratory effort, no increased work of breathing. Psychiatric: Normal mood and affect.   Pertinent Imaging: Ultrasound images personally reviewed and interpreted  Ultrasound renal complete  Narrative CLINICAL DATA:  Solid right renal mass  EXAM: RENAL / URINARY TRACT ULTRASOUND COMPLETE  COMPARISON:  Previous studies including the sonogram done on 06/26/2020  FINDINGS: Right Kidney:  Renal measurements: 17 by 6.1 x 8.9 cm = volume: 475.4 mL. There is no hydronephrosis. There is an exophytic solid lesion in the midportion of right kidney measuring approximately 2.7 by 3.0 x 2.4 cm. There is increased vascularity seen in color Doppler examination in the periphery of this lesion. There are multiple cortical cysts in the right kidney. There is 3.8 x 3.7 x 3.3 cm cyst in the upper pole. There is 4.5 x 4.3 x 4.3 cm cyst in the lower pole.  Left Kidney:  Renal measurements: 14.1 x 6.7 x 6.1 cm = volume: 310 mL. There is no hydronephrosis. There is 2.7 by 2.7 x 4 cm cyst in the lower pole. There is 1.8 x 1.1 x 1.5 cm cyst in the upper pole.  Bladder:  Urinary bladder is unremarkable.  Ureteral jets are observed on both sides.  Other:  None.  IMPRESSION: There is 3 cm smooth marginated solid lesion in the midportion of right kidney suggesting possible malignant neoplasm. Overall size of the lesion has not changed significantly in comparison with immediate previous study done on 06/26/2020. Bilateral renal cysts are seen. There is no hydronephrosis.   Electronically Signed By: Elmer Picker M.D. On: 01/02/2021 15:10   Assessment & Plan:    1.  Right renal mass Stable right renal mass without interval change We again discussed statistically this is most likely a small renal cell carcinoma and that masses <4 cm in size without growth are unlikely to metastasize and that surveillance is a continued option  He would like to continue surveillance and will schedule a follow-up visit in 9 months with a renal ultrasound prior.   Abbie Sons, [place] [address] [place], [place] [address] [phone]

## 2021-01-07 ENCOUNTER — Encounter: Payer: Self-pay | Admitting: Urology

## 2021-01-20 ENCOUNTER — Other Ambulatory Visit: Payer: Self-pay | Admitting: Family Medicine

## 2021-01-20 NOTE — Telephone Encounter (Signed)
Requested medication (s) are due for refill today: yes  Requested medication (s) are on the active medication list: yes  Last refill:  06/22/19 #90  Future visit scheduled: no  Notes to clinic:  do not see where med was addressed;   Requested Prescriptions  Pending Prescriptions Disp Refills   doxazosin (CARDURA) 8 MG tablet [Pharmacy Med Name: DOXAZOSIN MESYLATE 8 MG TAB] 90 tablet 0    Sig: TAKE 1 TABLET BY MOUTH ONCE DAILY     Cardiovascular:  Alpha Blockers Failed - 01/20/2021 10:48 AM      Failed - Last BP in normal range    BP Readings from Last 1 Encounters:  01/03/21 (!) 173/82          Passed - Valid encounter within last 6 months    Recent Outpatient Visits           4 months ago Essential hypertension   [facility] Jerrol Banana., MD   11 months ago Annual physical exam   [facility] Jerrol Banana., MD   1 year ago Acute right-sided low back pain without sciatica   [facility] Jerrol Banana., MD   1 year ago Essential hypertension   [facility] Jerrol Banana., MD   1 year ago Annual physical exam   [facility] Jerrol Banana., MD       Future Appointments             In 8 months Stoioff, Ronda Fairly, [facility]

## 2021-02-07 ENCOUNTER — Other Ambulatory Visit: Payer: Self-pay

## 2021-02-07 ENCOUNTER — Ambulatory Visit (INDEPENDENT_AMBULATORY_CARE_PROVIDER_SITE_OTHER): Payer: PPO

## 2021-02-07 VITALS — BP 126/60 | HR 57 | Temp 97.2°F | Ht 71.0 in | Wt 263.0 lb

## 2021-02-07 DIAGNOSIS — Z0000 Encounter for general adult medical examination without abnormal findings: Secondary | ICD-10-CM | POA: Diagnosis not present

## 2021-02-07 NOTE — Progress Notes (Signed)
Subjective:   Patrick Thornton is a 81 y.o. male who presents for Medicare Annual/Subsequent preventive examination.  Review of Systems           Objective:    Today's Vitals   02/07/21 0817  BP: 126/60  Pulse: (!) 57  Temp: (!) 97.2 F (36.2 C)  TempSrc: Oral  SpO2: 99%  Weight: 263 lb (119.3 kg)  Height: 5\' 11"  (1.803 m)   Body mass index is 36.68 kg/m.  Advanced Directives 02/03/2020 02/01/2019 01/27/2018 07/10/2016 01/09/2016 01/05/2016 11/08/2015  Does Patient Have a Medical Advance Directive? Yes Yes Yes Yes Yes Yes Yes  Type of Paramedic of [place];Living will [place];Living will [place];Living will [place];Living will [place];Living will - Living will;Healthcare Power of Attorney  Does patient want to make changes to medical advance directive? - - - - - No - Patient declined -  Copy of [place] in Chart? No - copy requested No - copy requested No - copy requested Yes No - copy requested - -    Current Medications (verified) Outpatient Encounter Medications as of 02/07/2021  Medication Sig   amLODipine (NORVASC) 2.5 MG tablet Take 2.5 mg by mouth daily.   clobetasol ointment (TEMOVATE) 2.83 % Apply 1 application topically. As needed   cyanocobalamin 1000 MCG tablet Take 1,000 mcg by mouth daily.   cyclobenzaprine (FLEXERIL) 10 MG tablet Take 1 tablet (10 mg total) by mouth at bedtime.   diazepam (VALIUM) 5 MG tablet Take 1 tablet (5 mg total) by mouth every 12 (twelve) hours as needed for anxiety. Take 1 hour before MRI, may repeat 30 minutes before MRI if not calm.   docusate sodium (COLACE) 100 MG capsule Take 1 capsule (100 mg total) by mouth 2 (two) times daily.   doxazosin (CARDURA) 8 MG tablet TAKE 1 TABLET BY MOUTH ONCE DAILY   furosemide (LASIX) 20 MG tablet TAKE 1 TABLET BY MOUTH ONCE DAILY   ibuprofen (ADVIL,MOTRIN) 800 MG tablet  TAKE 1 TABLET BY MOUTH TWICE A DAY AS NEEDED   Multiple Vitamin tablet Take 1 tablet by mouth daily.    quinapril (ACCUPRIL) 20 MG tablet TAKE 1 TABLET BY MOUTH ONCE DAILY   sildenafil (REVATIO) 20 MG tablet Take 1-5 tablets by mouth daily.   No facility-administered encounter medications on file as of 02/07/2021.    Allergies (verified) Tape   History: Past Medical History:  Diagnosis Date   Arthritis    Collar bone fracture    x 2   Colon polyp 2003   Dysrhythmia    occasionally skips a beat and has bradycardia(in the30's today)   Edema    legs/feet occas   History of hiatal hernia    Hypertension    Renal mass, right    Sleep apnea    20 years ago and did not wear cpap long   Past Surgical History:  Procedure Laterality Date   BUNIONECTOMY WITH HAMMERTOE RECONSTRUCTION Left 2011   Patrick Thornton N/A 01/09/2016   Procedure: CIRCUMCISION ADULT;  Surgeon: Patrick Espy, MD;  Location: [facility] ORS;  Service: Urology;  Laterality: N/A;   COLONOSCOPY  03-22-08   Dr Patrick Thornton   FOOT SURGERY Left    x 2 pins   FRACTURE SURGERY Left 1970   pins in left big toe and first toe   HERNIA REPAIR  1970   OTHER SURGICAL HISTORY  transanal excision of a villous adenoma   Family History  Problem Relation Age of Onset   Stroke Mother    Cancer Father        melanoma   Diabetes Father    Hypertension Father    Heart attack Father    Hyperlipidemia Brother    Heart attack Brother    Diabetes Son    COPD Son    Obesity Son    Heart attack Son    Hepatitis C Son    Cirrhosis Son    Kidney failure Son    CAD Brother    Diverticulitis Daughter    Heart disease Daughter    Heart attack Daughter    Social History   Socioeconomic History   Marital status: Widowed    Spouse name: Patrick Thornton   Number of children: 4   Years of education: Not on file   Highest education level: Some college, no degree  Occupational History   Occupation: retired  Tobacco Use    Smoking status: Former    Years: 40.00    Types: Cigarettes    Quit date: 01/04/1994    Years since quitting: 27.1   Smokeless tobacco: Never  Vaping Use   Vaping Use: Never used  Substance and Sexual Activity   Alcohol use: No   Drug use: No   Sexual activity: Never  Other Topics Concern   Not on file  Social History Narrative   Not on file   Social Determinants of Health   Financial Resource Strain: Low Risk    Difficulty of Paying Living Expenses: Not hard at all  Food Insecurity: No Food Insecurity   Worried About Charity fundraiser in the Last Year: Never true   [place] in the Last Year: Never true  Transportation Needs: No Transportation Needs   Lack of Transportation (Medical): No   Lack of Transportation (Non-Medical): No  Physical Activity: Inactive   Days of Exercise per Week: 0 days   Minutes of Exercise per Session: 0 min  Stress: No Stress Concern Present   Feeling of Stress : Not at all  Social Connections: Moderately Isolated   Frequency of Communication with Friends and Family: More than three times a week   Frequency of Social Gatherings with Friends and Family: Twice a week   Attends Religious Services: Never   Marine scientist or Organizations: Yes   Attends Archivist Meetings: Never   Marital Status: Widowed    Tobacco Counseling Counseling given: Not Answered   Clinical Intake:  Pre-visit preparation completed: Yes  Pain : No/denies pain     Nutritional Status: BMI > 30  Obese Nutritional Risks: None Diabetes: No  How often do you need to have someone help you when you read instructions, pamphlets, or other written materials from your doctor or pharmacy?: 1 - Never  Diabetic?no  Interpreter Needed?: No  Information entered by :: Patrick Shaggy, LPN   Activities of Daily Living No flowsheet data found.  Patient Care Team: Patrick Thornton., MD as PCP - General (Family Medicine) Patrick Thornton, Patrick Gleason, MD (General Surgery) Patrick Robson, MD as Referring Physician (Ophthalmology) Thornton, Patrick Char, MD (Dermatology) Patrick Glassing Javier Docker, MD as Consulting Physician (Cardiology) Patrick Sons, MD (Urology)  Indicate any recent Medical Services you may have received from other than [facility] providers in the past year (date may be approximate).     Assessment:   This is a routine  wellness examination for Patrick Thornton.  Hearing/Vision screen No results found.  Dietary issues and exercise activities discussed:     Goals Addressed   None    Depression Screen PHQ 2/9 Scores 02/03/2020 12/27/2019 02/01/2019 02/01/2019 01/27/2018 07/22/2017 07/10/2016  PHQ - 2 Score 0 0 1 1 1  0 1  PHQ- 9 Score - 4 - - - - -    Fall Risk Fall Risk  02/03/2020 12/27/2019 02/01/2019 01/27/2018 07/22/2017  Falls in the past year? 0 0 0 0 No  Number falls in past yr: 0 0 0 0 -  Injury with Fall? 0 0 0 0 -  Follow up - Falls evaluation completed - - -    FALL RISK PREVENTION PERTAINING TO THE HOME:  Any stairs in or around the home? Yes  If so, are there any without handrails? No  Home free of loose throw rugs in walkways, pet beds, electrical cords, etc? Yes  Adequate lighting in your home to reduce risk of falls? Yes   ASSISTIVE DEVICES UTILIZED TO PREVENT FALLS:  Life alert? No  Use of a cane, walker or w/c? Yes  Grab bars in the bathroom? Yes  Shower chair or bench in shower? Yes  Elevated toilet seat or a handicapped toilet? No   TIMED UP AND GO:  Was the test performed? Yes .  Length of time to ambulate 10 feet: 5 sec.   Gait slow and steady without use of assistive device  Cognitive Function:     6CIT Screen 07/10/2016  What Year? 0 points  What month? 0 points  What time? 0 points  Count back from 20 0 points  Months in reverse 0 points  Repeat phrase 0 points  Total Score 0    Immunizations Immunization History  Administered Date(s) Administered   Fluad Quad(high Dose 65+) 11/25/2018    Influenza Inj Mdck Quad Pf 12/09/2016   Influenza, High Dose Seasonal PF 11/01/2014, 10/10/2015   Influenza-Unspecified 10/21/2013, 10/21/2019   PFIZER(Purple Top)SARS-COV-2 Vaccination 02/15/2019, 03/15/2019, 09/24/2019   Pneumococcal Conjugate-13 10/27/2013   Pneumococcal Polysaccharide-23 07/30/2011   Td 11/25/2018    TDAP status: Up to date  Flu Vaccine status: Up to date  Pneumococcal vaccine status: Up to date  Covid-19 vaccine status: Completed vaccines  Qualifies for Shingles Vaccine? Yes   Zostavax completed No   Shingrix Completed?: No.    Education has been provided regarding the importance of this vaccine. Patient has been advised to call insurance company to determine out of pocket expense if they have not yet received this vaccine. Advised may also receive vaccine at local pharmacy or Health Dept. Verbalized acceptance and understanding.  Screening Tests Health Maintenance  Topic Date Due   Zoster Vaccines- Shingrix (1 of 2) Never done   COVID-19 Vaccine (4 - Booster for Pfizer series) 11/19/2019   INFLUENZA VACCINE  08/21/2020   TETANUS/TDAP  11/24/2028   Pneumonia Vaccine 57+ Years old  Completed   HPV VACCINES  Aged Out    Health Maintenance  Health Maintenance Due  Topic Date Due   Zoster Vaccines- Shingrix (1 of 2) Never done   COVID-19 Vaccine (4 - Booster for Pfizer series) 11/19/2019   INFLUENZA VACCINE  08/21/2020    Colorectal cancer screening: Type of screening: Colonoscopy. Completed 04/13/13. Repeat every 10 years (aged out)  Lung Cancer Screening: (Low Dose CT Chest recommended if Age 18-80 years, 30 pack-year currently smoking OR have quit w/in 15years.) does not qualify.    Additional Screening:  Hepatitis C Screening: does qualify; Completed no  Vision Screening: Recommended annual ophthalmology exams for early detection of glaucoma and other disorders of the eye. Is the patient up to date with their annual eye exam?  Yes  Who is the  provider or what is the name of the office in which the patient attends annual eye exams? [facility] If pt is not established with a provider, would they like to be referred to a provider to establish care? No .   Dental Screening: Recommended annual dental exams for proper oral hygiene  Community Resource Referral / Chronic Care Management: CRR required this visit?  No   CCM required this visit?  No      Plan:     I have personally reviewed and noted the following in the patients chart:   Medical and social history Use of alcohol, tobacco or illicit drugs  Current medications and supplements including opioid prescriptions. Patient is not currently taking opioid prescriptions. Functional ability and status Nutritional status Physical activity Advanced directives List of other physicians Hospitalizations, surgeries, and ER visits in previous 12 months Vitals Screenings to include cognitive, depression, and falls Referrals and appointments  In addition, I have reviewed and discussed with patient certain preventive protocols, quality metrics, and best practice recommendations. A written personalized care plan for preventive services as well as general preventive health recommendations were provided to patient.     Dionisio David, LPN   02/12/4823   Nurse Notes: none

## 2021-02-07 NOTE — Patient Instructions (Signed)
[name] , Thank you for taking time to come for your Medicare Wellness Visit. I appreciate your ongoing commitment to your health goals. Please review the following plan we discussed and let me know if I can assist you in the future.   Screening recommendations/referrals: Colonoscopy: 04/13/13 Recommended yearly ophthalmology/optometry visit for glaucoma screening and checkup Recommended yearly dental visit for hygiene and checkup  Vaccinations: Influenza vaccine: had this season at [facility] Pneumococcal vaccine: 10/27/13 Tdap vaccine: 11/25/18 Shingles vaccine: n/d   Covid-19: 02/15/19, 03/15/19, 09/24/19  Advanced directives: no  Conditions/risks identified: none  Next appointment: Follow up in one year for your annual wellness visit. 02/11/22 @ 9am in person  Preventive Care 80 Years and Older, Male Preventive care refers to lifestyle choices and visits with your health care provider that can promote health and wellness. What does preventive care include? A yearly physical exam. This is also called an annual well check. Dental exams once or twice a year. Routine eye exams. Ask your health care provider how often you should have your eyes checked. Personal lifestyle choices, including: Daily care of your teeth and gums. Regular physical activity. Eating a healthy diet. Avoiding tobacco and drug use. Limiting alcohol use. Practicing safe sex. Taking low doses of aspirin every day. Taking vitamin and mineral supplements as recommended by your health care provider. What happens during an annual well check? The services and screenings done by your health care provider during your annual well check will depend on your age, overall health, lifestyle risk factors, and family history of disease. Counseling  Your health care provider may ask you questions about your: Alcohol use. Tobacco use. Drug use. Emotional well-being. Home and relationship well-being. Sexual activity. Eating  habits. History of falls. Memory and ability to understand (cognition). Work and work Statistician. Screening  You may have the following tests or measurements: Height, weight, and BMI. Blood pressure. Lipid and cholesterol levels. These may be checked every 5 years, or more frequently if you are over 40 years old. Skin check. Lung cancer screening. You may have this screening every year starting at age 80 if you have a 30-pack-year history of smoking and currently smoke or have quit within the past 15 years. Fecal occult blood test (FOBT) of the stool. You may have this test every year starting at age 80. Flexible sigmoidoscopy or colonoscopy. You may have a sigmoidoscopy every 5 years or a colonoscopy every 10 years starting at age 80. Prostate cancer screening. Recommendations will vary depending on your family history and other risks. Hepatitis C blood test. Hepatitis B blood test. Sexually transmitted disease (STD) testing. Diabetes screening. This is done by checking your blood sugar (glucose) after you have not eaten for a while (fasting). You may have this done every 1-3 years. Abdominal aortic aneurysm (AAA) screening. You may need this if you are a current or former smoker. Osteoporosis. You may be screened starting at age 80 if you are at high risk. Talk with your health care provider about your test results, treatment options, and if necessary, the need for more tests. Vaccines  Your health care provider may recommend certain vaccines, such as: Influenza vaccine. This is recommended every year. Tetanus, diphtheria, and acellular pertussis (Tdap, Td) vaccine. You may need a Td booster every 10 years. Zoster vaccine. You may need this after age 80. Pneumococcal 13-valent conjugate (PCV13) vaccine. One dose is recommended after age 80. Pneumococcal polysaccharide (PPSV23) vaccine. One dose is recommended after age 80. Talk to  your health care provider about which screenings and  vaccines you need and how often you need them. This information is not intended to replace advice given to you by your health care provider. Make sure you discuss any questions you have with your health care provider. Document Released: 02/03/2015 Document Revised: 09/27/2015 Document Reviewed: 11/08/2014 Elsevier Interactive Patient Education  2017 [employer] Prevention in the Home Falls can cause injuries. They can happen to people of all ages. There are many things you can do to make your home safe and to help prevent falls. What can I do on the outside of my home? Regularly fix the edges of walkways and driveways and fix any cracks. Remove anything that might make you trip as you walk through a door, such as a raised step or threshold. Trim any bushes or trees on the path to your home. Use bright outdoor lighting. Clear any walking paths of anything that might make someone trip, such as rocks or tools. Regularly check to see if handrails are loose or broken. Make sure that both sides of any steps have handrails. Any raised decks and porches should have guardrails on the edges. Have any leaves, snow, or ice cleared regularly. Use sand or salt on walking paths during winter. Clean up any spills in your garage right away. This includes oil or grease spills. What can I do in the bathroom? Use night lights. Install grab bars by the toilet and in the tub and shower. Do not use towel bars as grab bars. Use non-skid mats or decals in the tub or shower. If you need to sit down in the shower, use a plastic, non-slip stool. Keep the floor dry. Clean up any water that spills on the floor as soon as it happens. Remove soap buildup in the tub or shower regularly. Attach bath mats securely with double-sided non-slip rug tape. Do not have throw rugs and other things on the floor that can make you trip. What can I do in the bedroom? Use night lights. Make sure that you have a light by your  bed that is easy to reach. Do not use any sheets or blankets that are too big for your bed. They should not hang down onto the floor. Have a firm chair that has side arms. You can use this for support while you get dressed. Do not have throw rugs and other things on the floor that can make you trip. What can I do in the kitchen? Clean up any spills right away. Avoid walking on wet floors. Keep items that you use a lot in easy-to-reach places. If you need to reach something above you, use a strong step stool that has a grab bar. Keep electrical cords out of the way. Do not use floor polish or wax that makes floors slippery. If you must use wax, use non-skid floor wax. Do not have throw rugs and other things on the floor that can make you trip. What can I do with my stairs? Do not leave any items on the stairs. Make sure that there are handrails on both sides of the stairs and use them. Fix handrails that are broken or loose. Make sure that handrails are as long as the stairways. Check any carpeting to make sure that it is firmly attached to the stairs. Fix any carpet that is loose or worn. Avoid having throw rugs at the top or bottom of the stairs. If you do have throw rugs, attach  them to the floor with carpet tape. Make sure that you have a light switch at the top of the stairs and the bottom of the stairs. If you do not have them, ask someone to add them for you. What else can I do to help prevent falls? Wear shoes that: Do not have high heels. Have rubber bottoms. Are comfortable and fit you well. Are closed at the toe. Do not wear sandals. If you use a stepladder: Make sure that it is fully opened. Do not climb a closed stepladder. Make sure that both sides of the stepladder are locked into place. Ask someone to hold it for you, if possible. Clearly mark and make sure that you can see: Any grab bars or handrails. First and last steps. Where the edge of each step is. Use tools that  help you move around (mobility aids) if they are needed. These include: Canes. Walkers. Scooters. Crutches. Turn on the lights when you go into a dark area. Replace any light bulbs as soon as they burn out. Set up your furniture so you have a clear path. Avoid moving your furniture around. If any of your floors are uneven, fix them. If there are any pets around you, be aware of where they are. Review your medicines with your doctor. Some medicines can make you feel dizzy. This can increase your chance of falling. Ask your doctor what other things that you can do to help prevent falls. This information is not intended to replace advice given to you by your health care provider. Make sure you discuss any questions you have with your health care provider. Document Released: 11/03/2008 Document Revised: 06/15/2015 Document Reviewed: 02/11/2014 Elsevier Interactive Patient Education  2017 [employer].

## 2021-02-09 ENCOUNTER — Other Ambulatory Visit: Payer: Self-pay | Admitting: Family Medicine

## 2021-02-09 DIAGNOSIS — N401 Enlarged prostate with lower urinary tract symptoms: Secondary | ICD-10-CM

## 2021-02-09 DIAGNOSIS — N529 Male erectile dysfunction, unspecified: Secondary | ICD-10-CM

## 2021-04-19 DIAGNOSIS — I10 Essential (primary) hypertension: Secondary | ICD-10-CM | POA: Diagnosis not present

## 2021-04-19 DIAGNOSIS — I272 Pulmonary hypertension, unspecified: Secondary | ICD-10-CM | POA: Diagnosis not present

## 2021-04-19 DIAGNOSIS — G4733 Obstructive sleep apnea (adult) (pediatric): Secondary | ICD-10-CM | POA: Diagnosis not present

## 2021-04-19 DIAGNOSIS — I493 Ventricular premature depolarization: Secondary | ICD-10-CM | POA: Diagnosis not present

## 2021-04-19 DIAGNOSIS — I119 Hypertensive heart disease without heart failure: Secondary | ICD-10-CM | POA: Diagnosis not present

## 2021-06-11 ENCOUNTER — Other Ambulatory Visit: Payer: Self-pay | Admitting: Family Medicine

## 2021-06-12 NOTE — Telephone Encounter (Signed)
Requested Prescriptions  Pending Prescriptions Disp Refills  . quinapril (ACCUPRIL) 20 MG tablet [Pharmacy Med Name: QUINAPRIL HCL 20 MG TAB] 90 tablet 1    Sig: TAKE 1 TABLET BY MOUTH ONCE DAILY     Cardiovascular: ACE Inhibitors 2 Failed - 06/11/2021  9:20 AM      Failed - K in normal range and within 180 days    Potassium  Date Value Ref Range Status  02/24/2020 4.4 3.5 - 5.2 mmol/L Final         Failed - Cr in normal range and within 180 days    Creatinine, Ser  Date Value Ref Range Status  02/24/2020 0.99 0.76 - 1.27 mg/dL Final         Failed - eGFR is 30 or above and within 180 days    GFR calc Af Amer  Date Value Ref Range Status  02/24/2020 84 >59 mL/min/1.73 Final    Comment:    **In accordance with recommendations from the NKF-ASN Task force,**   [facility] is in the process of updating its eGFR calculation to the   2021 CKD-EPI creatinine equation that estimates kidney function   without a race variable.    GFR calc non Af Amer  Date Value Ref Range Status  02/24/2020 73 >59 mL/min/1.73 Final         Passed - Patient is not pregnant      Passed - Last BP in normal range    BP Readings from Last 1 Encounters:  02/07/21 126/60         Passed - Valid encounter within last 6 months    Recent Outpatient Visits          9 months ago Essential hypertension   [facility] Jerrol Banana., MD   1 year ago Annual physical exam   [facility] Jerrol Banana., MD   1 year ago Acute right-sided low back pain without sciatica   [facility] Jerrol Banana., MD   1 year ago Essential hypertension   [facility] Jerrol Banana., MD   2 years ago Annual physical exam   [facility] Jerrol Banana., MD      Future Appointments            In 3 months Stoioff, Ronda Fairly, MD [place]           . doxazosin (CARDURA) 8 MG tablet [Pharmacy Med  Name: DOXAZOSIN MESYLATE 8 MG TAB] 90 tablet 0    Sig: TAKE 1 TABLET BY MOUTH ONCE DAILY     Cardiovascular:  Alpha Blockers Passed - 06/11/2021  9:20 AM      Passed - Last BP in normal range    BP Readings from Last 1 Encounters:  02/07/21 126/60         Passed - Valid encounter within last 6 months    Recent Outpatient Visits          9 months ago Essential hypertension   [facility] Jerrol Banana., MD   1 year ago Annual physical exam   [facility] Jerrol Banana., MD   1 year ago Acute right-sided low back pain without sciatica   [facility] Jerrol Banana., MD   1 year ago Essential hypertension   [facility] Jerrol Banana., MD   2 years ago Annual physical exam  [facility] Jerrol Banana., MD      Future Appointments            In 3 months [place], Ronda Fairly, MD [facility]

## 2021-06-12 NOTE — Telephone Encounter (Signed)
Requested medications are due for refill today.  yes  Requested medications are on the active medications list.  yes  Last refill. 12/13/2020 #90 1 refill  Future visit scheduled.   yes  Notes to clinic.  Medication refill failed protocol due to expired labs.    Requested Prescriptions  Pending Prescriptions Disp Refills   quinapril (ACCUPRIL) 20 MG tablet [Pharmacy Med Name: QUINAPRIL HCL 20 MG TAB] 90 tablet 1    Sig: TAKE 1 TABLET BY MOUTH ONCE DAILY     Cardiovascular: ACE Inhibitors 2 Failed - 06/11/2021  9:20 AM      Failed - K in normal range and within 180 days    Potassium  Date Value Ref Range Status  02/24/2020 4.4 3.5 - 5.2 mmol/L Final         Failed - Cr in normal range and within 180 days    Creatinine, Ser  Date Value Ref Range Status  02/24/2020 0.99 0.76 - 1.27 mg/dL Final         Failed - eGFR is 30 or above and within 180 days    GFR calc Af Amer  Date Value Ref Range Status  02/24/2020 84 >59 mL/min/1.73 Final    Comment:    **In accordance with recommendations from the NKF-ASN Task force,**   [facility] is in the process of updating its eGFR calculation to the   2021 CKD-EPI creatinine equation that estimates kidney function   without a race variable.    GFR calc non Af Amer  Date Value Ref Range Status  02/24/2020 73 >59 mL/min/1.73 Final         Passed - Patient is not pregnant      Passed - Last BP in normal range    BP Readings from Last 1 Encounters:  02/07/21 126/60         Passed - Valid encounter within last 6 months    Recent Outpatient Visits           9 months ago Essential hypertension   [facility] Jerrol Banana., MD   1 year ago Annual physical exam   [facility] Jerrol Banana., MD   1 year ago Acute right-sided low back pain without sciatica   [facility] Jerrol Banana., MD   1 year ago Essential hypertension   [facility] Jerrol Banana., MD   2 years ago Annual physical exam   [facility] Jerrol Banana., MD       Future Appointments             In 3 months Stoioff, Ronda Fairly, MD [facility]             Signed Prescriptions Disp Refills   doxazosin (CARDURA) 8 MG tablet 90 tablet 0    Sig: TAKE 1 TABLET BY MOUTH ONCE DAILY     Cardiovascular:  Alpha Blockers Passed - 06/11/2021  9:20 AM      Passed - Last BP in normal range    BP Readings from Last 1 Encounters:  02/07/21 126/60         Passed - Valid encounter within last 6 months    Recent Outpatient Visits           9 months ago Essential hypertension   [facility] Jerrol Banana., MD   1 year ago Annual physical exam   [facility]  Jerrol Banana., MD   1 year ago Acute right-sided low back pain without sciatica   [facility] Jerrol Banana., MD   1 year ago Essential hypertension   [facility] Jerrol Banana., MD   2 years ago Annual physical exam   [facility] Jerrol Banana., MD       Future Appointments             In 3 months [place], MD [facility]

## 2021-07-11 ENCOUNTER — Encounter: Payer: Self-pay | Admitting: Family Medicine

## 2021-07-11 ENCOUNTER — Ambulatory Visit (INDEPENDENT_AMBULATORY_CARE_PROVIDER_SITE_OTHER): Payer: PPO | Admitting: Family Medicine

## 2021-07-11 VITALS — BP 130/60 | HR 56 | Resp 16 | Wt 259.0 lb

## 2021-07-11 DIAGNOSIS — G4733 Obstructive sleep apnea (adult) (pediatric): Secondary | ICD-10-CM | POA: Diagnosis not present

## 2021-07-11 DIAGNOSIS — Z6841 Body Mass Index (BMI) 40.0 and over, adult: Secondary | ICD-10-CM

## 2021-07-11 DIAGNOSIS — N529 Male erectile dysfunction, unspecified: Secondary | ICD-10-CM

## 2021-07-11 DIAGNOSIS — N2889 Other specified disorders of kidney and ureter: Secondary | ICD-10-CM | POA: Diagnosis not present

## 2021-07-11 DIAGNOSIS — E039 Hypothyroidism, unspecified: Secondary | ICD-10-CM | POA: Diagnosis not present

## 2021-07-11 DIAGNOSIS — I10 Essential (primary) hypertension: Secondary | ICD-10-CM

## 2021-07-11 DIAGNOSIS — R7303 Prediabetes: Secondary | ICD-10-CM | POA: Diagnosis not present

## 2021-07-11 DIAGNOSIS — N401 Enlarged prostate with lower urinary tract symptoms: Secondary | ICD-10-CM | POA: Diagnosis not present

## 2021-07-11 DIAGNOSIS — E66813 Obesity, class 3: Secondary | ICD-10-CM

## 2021-07-11 MED ORDER — SILDENAFIL CITRATE 20 MG PO TABS: ORAL_TABLET | ORAL | 5 refills | Status: AC

## 2021-07-11 NOTE — Progress Notes (Unsigned)
Established patient visit  I,Patrick Thornton,acting as a scribe for Patrick Durie, MD.,have documented all relevant documentation on the behalf of Patrick Durie, MD,as directed by  Patrick Durie, MD while in the presence of Patrick Durie, MD.   Patient: Patrick Thornton   DOB: 05/25/1940   81 y.o. Male  MRN: [id number] Visit Date: 07/11/2021  Today's healthcare provider: [name], MD   Chief Complaint  Patient presents with   Follow-up   Hypertension   Subjective    HPI  Hypertension, follow-up  BP Readings from Last 3 Encounters:  07/11/21 130/60  02/07/21 126/60  01/03/21 (!) 173/82   Wt Readings from Last 3 Encounters:  07/11/21 259 lb (117.5 kg)  02/07/21 263 lb (119.3 kg)  01/03/21 260 lb (117.9 kg)     He was last seen for hypertension 10 months ago.  Management since that visit includes; on quinapril 20 mg and amlodipine 2.5 mg.  Outside blood pressures are does check at home--normal.  ---------------------------------------------------------------------------------------------------   Medications: Outpatient Medications Prior to Visit  Medication Sig   amLODipine (NORVASC) 2.5 MG tablet Take 2.5 mg by mouth daily.   clobetasol ointment (TEMOVATE) 0.81 % Apply 1 application  topically. As needed   doxazosin (CARDURA) 8 MG tablet TAKE 1 TABLET BY MOUTH ONCE DAILY   FLUAD QUADRIVALENT 0.5 ML injection    furosemide (LASIX) 20 MG tablet TAKE 1 TABLET BY MOUTH ONCE DAILY   ibuprofen (ADVIL,MOTRIN) 800 MG tablet TAKE 1 TABLET BY MOUTH TWICE A DAY AS NEEDED   Multiple Vitamin tablet Take 1 tablet by mouth daily.    quinapril (ACCUPRIL) 20 MG tablet Please schedule office visit before any future refill.   [DISCONTINUED] sildenafil (REVATIO) 20 MG tablet Take 1-5 tablets by mouth daily.   [DISCONTINUED] cyanocobalamin 1000 MCG tablet Take 1,000 mcg by mouth daily. (Patient not taking: Reported on 02/07/2021)   [DISCONTINUED]  cyclobenzaprine (FLEXERIL) 10 MG tablet Take 1 tablet (10 mg total) by mouth at bedtime. (Patient not taking: Reported on 02/07/2021)   [DISCONTINUED] diazepam (VALIUM) 5 MG tablet Take 1 tablet (5 mg total) by mouth every 12 (twelve) hours as needed for anxiety. Take 1 hour before MRI, may repeat 30 minutes before MRI if not calm. (Patient not taking: Reported on 02/07/2021)   [DISCONTINUED] docusate sodium (COLACE) 100 MG capsule Take 1 capsule (100 mg total) by mouth 2 (two) times daily. (Patient not taking: Reported on 02/07/2021)   No facility-administered medications prior to visit.    Review of Systems  Constitutional:  Negative for appetite change, chills and fever.  Respiratory:  Negative for chest tightness, shortness of breath and wheezing.   Cardiovascular:  Negative for chest pain and palpitations.  Gastrointestinal:  Negative for abdominal pain, nausea and vomiting.    {Labs  Heme  Chem  Endocrine  Serology  Results Review (optional):23779}   Objective    BP 130/60 (BP Location: Right Arm, Patient Position: Sitting, Cuff Size: Large)   Pulse (!) 56   Resp 16   Wt 259 lb (117.5 kg)   SpO2 97%   BMI 36.12 kg/m  {Show previous vital signs (optional):23777}  Physical Exam  ***  No results found for any visits on 07/11/21.  Assessment & Plan     1. Essential hypertension  - Hemoglobin A1c - TSH - CBC w/Diff/Platelet - Comprehensive Metabolic Panel (CMET)  2. Benign prostatic hyperplasia with lower urinary tract symptoms, symptom details unspecified  -  sildenafil (REVATIO) 20 MG tablet; Take 1-5 tablets by mouth daily.  Dispense: 30 tablet; Refill: 5  3. Erectile dysfunction, unspecified erectile dysfunction type  - sildenafil (REVATIO) 20 MG tablet; Take 1-5 tablets by mouth daily.  Dispense: 30 tablet; Refill: 5  4. Hypothyroidism, unspecified type  - Hemoglobin A1c - TSH - CBC w/Diff/Platelet - Comprehensive Metabolic Panel (CMET)  5.  Prediabetes  - Hemoglobin A1c - TSH - CBC w/Diff/Platelet - Comprehensive Metabolic Panel (CMET)  6. Class 3 severe obesity due to excess calories with serious comorbidity and body mass index (BMI) of 40.0 to 44.9 in adult (HCC)  - Hemoglobin A1c - TSH - CBC w/Diff/Platelet - Comprehensive Metabolic Panel (CMET)   Return in about 6 months (around 01/10/2022).         Patrick Cranford Mon, MD  [facility] [phone] (phone) [phone] (fax)  [place]

## 2021-07-12 LAB — CBC WITH DIFFERENTIAL/PLATELET
Basophils Absolute: 0.1 10*3/uL (ref 0.0–0.2)
Basos: 1 %
EOS (ABSOLUTE): 0.1 10*3/uL (ref 0.0–0.4)
Eos: 1 %
Hematocrit: 41 % (ref 37.5–51.0)
Hemoglobin: 14.3 g/dL (ref 13.0–17.7)
Immature Grans (Abs): 0 10*3/uL (ref 0.0–0.1)
Immature Granulocytes: 0 %
Lymphocytes Absolute: 1.9 10*3/uL (ref 0.7–3.1)
Lymphs: 30 %
MCH: 32.7 pg (ref 26.6–33.0)
MCHC: 34.9 g/dL (ref 31.5–35.7)
MCV: 94 fL (ref 79–97)
Monocytes Absolute: 0.5 10*3/uL (ref 0.1–0.9)
Monocytes: 8 %
Neutrophils Absolute: 3.8 10*3/uL (ref 1.4–7.0)
Neutrophils: 60 %
Platelets: 235 10*3/uL (ref 150–450)
RBC: 4.37 x10E6/uL (ref 4.14–5.80)
RDW: 12.6 % (ref 11.6–15.4)
WBC: 6.3 10*3/uL (ref 3.4–10.8)

## 2021-07-12 LAB — COMPREHENSIVE METABOLIC PANEL
ALT: 18 IU/L (ref 0–44)
AST: 20 IU/L (ref 0–40)
Albumin/Globulin Ratio: 1.5 (ref 1.2–2.2)
Albumin: 4 g/dL (ref 3.7–4.7)
Alkaline Phosphatase: 99 IU/L (ref 44–121)
BUN/Creatinine Ratio: 12 (ref 10–24)
BUN: 12 mg/dL (ref 8–27)
Bilirubin Total: 0.8 mg/dL (ref 0.0–1.2)
CO2: 25 mmol/L (ref 20–29)
Calcium: 8.7 mg/dL (ref 8.6–10.2)
Chloride: 102 mmol/L (ref 96–106)
Creatinine, Ser: 1.04 mg/dL (ref 0.76–1.27)
Globulin, Total: 2.6 g/dL (ref 1.5–4.5)
Glucose: 96 mg/dL (ref 70–99)
Potassium: 4.2 mmol/L (ref 3.5–5.2)
Sodium: 141 mmol/L (ref 134–144)
Total Protein: 6.6 g/dL (ref 6.0–8.5)
eGFR: 73 mL/min/{1.73_m2} (ref >59)

## 2021-07-12 LAB — HEMOGLOBIN A1C
Est. average glucose Bld gHb Est-mCnc: 117 mg/dL
Hgb A1c MFr Bld: 5.7 % — ABNORMAL HIGH (ref 4.8–5.6)

## 2021-07-12 LAB — TSH: TSH: 4.94 u[IU]/mL — ABNORMAL HIGH (ref 0.450–4.500)

## 2021-08-03 ENCOUNTER — Telehealth: Payer: Self-pay | Admitting: Family Medicine

## 2021-08-03 NOTE — Telephone Encounter (Signed)
Please advise 

## 2021-08-03 NOTE — Telephone Encounter (Signed)
Medication Refill - Medication: quinapril (ACCUPRIL) 20 MG tablet   Has the patient contacted their pharmacy? Yes.    (Agent: If yes, when and what did the pharmacy advise?) Pharmacist stated quinapril (ACCUPRIL) 20 MG tablet has been recalled and there no longer making the medication therefore requesting PCP send an alternate.   Preferred Pharmacy (with phone number or street name):   [facility] - [place], [place] Phone:  [phone]  Fax:  [phone]      Has the patient been seen for an appointment in the last year OR does the patient have an upcoming appointment? Yes.    Agent: Please be advised that RX refills may take up to 3 business days. We ask that you follow-up with your pharmacy.

## 2021-08-06 ENCOUNTER — Other Ambulatory Visit: Payer: Self-pay | Admitting: *Deleted

## 2021-08-06 DIAGNOSIS — I10 Essential (primary) hypertension: Secondary | ICD-10-CM

## 2021-08-06 MED ORDER — LISINOPRIL 20 MG PO TABS: 20.0000 mg | ORAL_TABLET | Freq: Every day | ORAL | 3 refills | Status: AC

## 2021-08-06 NOTE — Telephone Encounter (Signed)
Lisinopril was sent to pharmacy.

## 2021-09-17 DIAGNOSIS — L570 Actinic keratosis: Secondary | ICD-10-CM | POA: Diagnosis not present

## 2021-09-17 DIAGNOSIS — L900 Lichen sclerosus et atrophicus: Secondary | ICD-10-CM | POA: Diagnosis not present

## 2021-09-17 DIAGNOSIS — Z85828 Personal history of other malignant neoplasm of skin: Secondary | ICD-10-CM | POA: Diagnosis not present

## 2021-09-17 DIAGNOSIS — D2262 Melanocytic nevi of left upper limb, including shoulder: Secondary | ICD-10-CM | POA: Diagnosis not present

## 2021-09-17 DIAGNOSIS — D2272 Melanocytic nevi of left lower limb, including hip: Secondary | ICD-10-CM | POA: Diagnosis not present

## 2021-09-28 ENCOUNTER — Ambulatory Visit
Admission: RE | Admit: 2021-09-28 | Discharge: 2021-09-28 | Disposition: A | Discharge status: Home / Self Care | Payer: PPO | Source: Ambulatory Visit | Attending: Urology | Admitting: Urology

## 2021-09-28 DIAGNOSIS — N2889 Other specified disorders of kidney and ureter: Secondary | ICD-10-CM | POA: Insufficient documentation

## 2021-09-28 DIAGNOSIS — R1900 Intra-abdominal and pelvic swelling, mass and lump, unspecified site: Secondary | ICD-10-CM | POA: Diagnosis not present

## 2021-09-28 DIAGNOSIS — N281 Cyst of kidney, acquired: Secondary | ICD-10-CM | POA: Diagnosis not present

## 2021-10-04 ENCOUNTER — Ambulatory Visit: Payer: PPO | Admitting: Urology

## 2021-10-05 ENCOUNTER — Ambulatory Visit: Payer: PPO | Admitting: Urology

## 2021-10-05 ENCOUNTER — Encounter: Payer: Self-pay | Admitting: Urology

## 2021-10-05 VITALS — BP 184/76 | HR 78 | Ht 70.0 in | Wt 258.0 lb

## 2021-10-05 DIAGNOSIS — N2889 Other specified disorders of kidney and ureter: Secondary | ICD-10-CM | POA: Diagnosis not present

## 2021-10-05 NOTE — Progress Notes (Signed)
10/05/2021 3:30 PM   Patrick Thornton 10-25-1940 [id number]  Referring provider: Jerrol Banana., MD [address] [place],  [place] [address]  Chief Complaint  Patient presents with   Other    Urologic history: 1.  Renal mass 3.2 x 2.3 x 3.0 cm enhancing right renal mass (MRI 12/2019) Elected active surveillance   HPI: 81 y.o. male presents for 6 month follow-up of a right renal mass.  He presents today with his 2 daughters  Doing well since last visit No bothersome LUTS Denies dysuria, gross hematuria Denies flank, abdominal or pelvic pain RUS performed 09/28/2021 with increasing size-current measurements 3.7 x 2.9 x 3.5 cm (2.7 x 2.9 x 2.4 cm prior scan 12/2020)  PMH: Past Medical History:  Diagnosis Date   Arthritis    Collar bone fracture    x 2   Colon polyp 2003   Dysrhythmia    occasionally skips a beat and has bradycardia(in the30's today)   Edema    legs/feet occas   History of hiatal hernia    Hypertension    Renal mass, right    Sleep apnea    20 years ago and did not wear cpap long    Surgical History: Past Surgical History:  Procedure Laterality Date   BUNIONECTOMY WITH HAMMERTOE RECONSTRUCTION Left 2011   [place] N/A 01/09/2016   Procedure: CIRCUMCISION ADULT;  Surgeon: Hollice Espy, MD;  Location: [facility] ORS;  Service: Urology;  Laterality: N/A;   COLONOSCOPY  03-22-08   Dr Bary Castilla   FOOT SURGERY Left    x 2 pins   FRACTURE SURGERY Left 1970   pins in left big toe and first toe   HERNIA REPAIR  1970   OTHER SURGICAL HISTORY     transanal excision of a villous adenoma    Home Medications:  Allergies as of 10/05/2021       Reactions   Tape Rash   Blisters; skin tore off when patient took off electrodes from wearing a holter monitor         Medication List        Accurate as of October 05, 2021  3:30 PM. If you have any questions, ask your nurse or doctor.          amLODipine  2.5 MG tablet Commonly known as: NORVASC Take 2.5 mg by mouth daily.   clobetasol ointment 0.05 % Commonly known as: TEMOVATE Apply 1 application  topically. As needed   doxazosin 8 MG tablet Commonly known as: CARDURA TAKE 1 TABLET BY MOUTH ONCE DAILY   Fluad Quadrivalent 0.5 ML injection Generic drug: influenza vaccine adjuvanted   furosemide 20 MG tablet Commonly known as: LASIX TAKE 1 TABLET BY MOUTH ONCE DAILY   ibuprofen 800 MG tablet Commonly known as: ADVIL TAKE 1 TABLET BY MOUTH TWICE A DAY AS NEEDED   lisinopril 20 MG tablet Commonly known as: ZESTRIL Take 1 tablet (20 mg total) by mouth daily.   Multiple Vitamin tablet Take 1 tablet by mouth daily.   sildenafil 20 MG tablet Commonly known as: REVATIO Take 1-5 tablets by mouth daily.        Allergies:  Allergies  Allergen Reactions   Tape Rash    Blisters; skin tore off when patient took off electrodes from wearing a holter monitor     Family History: Family History  Problem Relation Age of Onset   Stroke Mother    Cancer Father  melanoma   Diabetes Father    Hypertension Father    Heart attack Father    Hyperlipidemia Brother    Heart attack Brother    Diabetes Son    COPD Son    Obesity Son    Heart attack Son    Hepatitis C Son    Cirrhosis Son    Kidney failure Son    CAD Brother    Diverticulitis Daughter    Heart disease Daughter    Heart attack Daughter     Social History:  reports that he quit smoking about 27 years ago. His smoking use included cigarettes. He has never used smokeless tobacco. He reports that he does not drink alcohol and does not use drugs.   Physical Exam: BP (!) 184/76   Pulse 78   Ht '5\' 10"'$  (1.778 m)   Wt 258 lb (117 kg)   BMI 37.02 kg/m   Constitutional:  Alert and oriented, No acute distress. HEENT: [place] AT, moist mucus membranes.  Trachea midline, no masses. Cardiovascular: No clubbing, cyanosis, or edema. Respiratory: Normal respiratory  effort, no increased work of breathing. Psychiatric: Normal mood and affect.   Pertinent Imaging: Ultrasound images personally reviewed and interpreted    Assessment & Plan:    1.  Right renal mass Significant size increase and renal mass over the last 9 months Due to location may not be amenable to partial nephrectomy We discussed percutaneous cryoablation which they are interested in pursuing.  Referral to interventional radiology to see if he is a felt to be a candidate for percutaneous treatment   Abbie Sons, MD  [facility] [address], [place], [place] [address] [phone]

## 2021-10-16 ENCOUNTER — Other Ambulatory Visit: Payer: Self-pay | Admitting: Urology

## 2021-10-16 ENCOUNTER — Telehealth: Payer: Self-pay | Admitting: Family Medicine

## 2021-10-16 DIAGNOSIS — N2889 Other specified disorders of kidney and ureter: Secondary | ICD-10-CM

## 2021-10-16 NOTE — Telephone Encounter (Signed)
Referral to interventional radiology to see if he is a candidate for percutaneous treatment. Message sent to [facility].

## 2021-10-17 NOTE — Telephone Encounter (Signed)
Appointment has been made

## 2021-10-18 ENCOUNTER — Ambulatory Visit
Admission: RE | Admit: 2021-10-18 | Discharge: 2021-10-18 | Disposition: A | Discharge status: Home / Self Care | Payer: PPO | Source: Ambulatory Visit | Attending: Urology | Admitting: Urology

## 2021-10-18 DIAGNOSIS — N2889 Other specified disorders of kidney and ureter: Secondary | ICD-10-CM

## 2021-10-18 HISTORY — PX: IR RADIOLOGIST EVAL & MGMT: IMG5224

## 2021-10-18 NOTE — Consult Note (Signed)
Chief Complaint: Patient was seen in consultation today for No chief complaint on file.  at the request of Patrick Thornton  Referring Physician(s): Patrick Thornton  History of Present Illness: Patrick Thornton is a 81 y.o. male with history of renal mass to presents via virtual telephone clinic visit at the gracious referral of Patrick Thornton for consideration of renal mass ablation.  Patrick Thornton was first diagnosed with a right renal mass in 2021 after undergoing a renal ultrasound for right flank pain.  This was further evaluated a couple months later with an MR which showed an enhancing exophytic upper pole mass measuring up to 3.2 cm.  The mass has been followed by ultrasound since then, and has grown to 3.7 cm.  Given recent increase size, he is motivated to pursue intervention.  He remains asymptomatic without any right flank or abdominal pain, no hematuria.  No recent fevers, chills, chest pain, shortness of breath, nausea, vomiting, or diarrhea.     Past Medical History:  Diagnosis Date   Arthritis    Collar bone fracture    x 2   Colon polyp 2003   Dysrhythmia    occasionally skips a beat and has bradycardia(in the30's today)   Edema    legs/feet occas   History of hiatal hernia    Hypertension    Renal mass, right    Sleep apnea    20 years ago and did not wear cpap long    Past Surgical History:  Procedure Laterality Date   BUNIONECTOMY WITH HAMMERTOE RECONSTRUCTION Left 2011   [place] N/A 01/09/2016   Procedure: CIRCUMCISION ADULT;  Surgeon: Hollice Espy, MD;  Location: [facility] ORS;  Service: Urology;  Laterality: N/A;   COLONOSCOPY  03-22-08   Dr Bary Castilla   FOOT SURGERY Left    x 2 pins   FRACTURE SURGERY Left 1970   pins in left big toe and first toe   HERNIA REPAIR  1970   OTHER SURGICAL HISTORY     transanal excision of a villous adenoma    Allergies: Tape  Medications: Prior to Admission medications   Medication Sig Start  Date End Date Taking? Authorizing Provider  amLODipine (NORVASC) 2.5 MG tablet Take 2.5 mg by mouth daily. 10/13/19   [provider]  clobetasol ointment (TEMOVATE) 2.67 % Apply 1 application  topically. As needed 12/03/18   [provider]  doxazosin (CARDURA) 8 MG tablet TAKE 1 TABLET BY MOUTH ONCE DAILY 06/12/21   Jerrol Banana., MD  FLUAD QUADRIVALENT 0.5 ML injection  11/21/20   [provider]  furosemide (LASIX) 20 MG tablet TAKE 1 TABLET BY MOUTH ONCE DAILY 12/05/20   Jerrol Banana., MD  ibuprofen (ADVIL,MOTRIN) 800 MG tablet TAKE 1 TABLET BY MOUTH TWICE A DAY AS NEEDED 11/29/15   Jerrol Banana., MD  lisinopril (ZESTRIL) 20 MG tablet Take 1 tablet (20 mg total) by mouth daily. 08/06/21   Jerrol Banana., MD  Multiple Vitamin tablet Take 1 tablet by mouth daily.  07/30/11   [provider]  sildenafil (REVATIO) 20 MG tablet Take 1-5 tablets by mouth daily. 07/11/21   Jerrol Banana., MD     Family History  Problem Relation Age of Onset   Stroke Mother    Cancer Father        melanoma   Diabetes Father    Hypertension Father    Heart attack Father  Hyperlipidemia Brother    Heart attack Brother    Diabetes Son    COPD Son    Obesity Son    Heart attack Son    Hepatitis Thornton Son    Cirrhosis Son    Kidney failure Son    CAD Brother    Diverticulitis Daughter    Heart disease Daughter    Heart attack Daughter     Social History   Socioeconomic History   Marital status: Widowed    Spouse name: Patrick Thornton   Number of children: 4   Years of education: Not on file   Highest education level: Some college, no degree  Occupational History   Occupation: retired  Tobacco Use   Smoking status: Former    Years: 40.00    Types: Cigarettes    Quit date: 01/04/1994    Years since quitting: 27.8   Smokeless tobacco: Never  Vaping Use   Vaping Use: Never used  Substance and Sexual Activity   Alcohol use: No    Drug use: No   Sexual activity: Never  Other Topics Concern   Not on file  Social History Narrative   Not on file   Social Determinants of Health   Financial Resource Strain: Low Risk  (02/07/2021)   Overall Financial Resource Strain (CARDIA)    Difficulty of Paying Living Expenses: Not hard at all  Food Insecurity: No Food Insecurity (02/07/2021)   Hunger Vital Sign    Worried About Running Out of Food in the Last Year: Never true    [place] in the Last Year: Never true  Transportation Needs: No Transportation Needs (02/07/2021)   PRAPARE - Hydrologist (Medical): No    Lack of Transportation (Non-Medical): No  Physical Activity: Insufficiently Active (02/07/2021)   Exercise Vital Sign    Days of Exercise per Week: 4 days    Minutes of Exercise per Session: 30 min  Stress: No Stress Concern Present (02/07/2021)   [place]    Feeling of Stress : Not at all  Social Connections: Moderately Isolated (02/07/2021)   Social Connection and Isolation Panel [NHANES]    Frequency of Communication with Friends and Family: More than three times a week    Frequency of Social Gatherings with Friends and Family: Once a week    Attends Religious Services: Never    Marine scientist or Organizations: Yes    Attends Archivist Meetings: Never    Marital Status: Widowed    ECOG Status: 0 - Asymptomatic  Review of Systems: A 12 point ROS discussed and pertinent positives are indicated in the HPI above.  All other systems are negative.   Vital Signs: There were no vitals taken for this visit.  Advance Care Plan: The advanced care plan/surrogate decision maker was discussed at the time of visit and documented in the medical record.   No physical examination was performed in lieu of virtual telephone clinic visit.   Imaging: MR 01/19/20   3.2 cm enhancing, exophytic mass  about lateral upper pole of right kidney  US Renal 09/28/21    Labs:  CBC: Recent Labs    07/11/21 1558  WBC 6.3  HGB 14.3  HCT 41.0  PLT 235    COAGS: No results for input(s): "INR", "APTT" in the last 8760 hours.  BMP: Recent Labs    07/11/21 1558  NA 141  K 4.2  CL 102  CO2 25  GLUCOSE 96  BUN 12  CALCIUM 8.7  CREATININE 1.04    LIVER FUNCTION TESTS: Recent Labs    07/11/21 1558  BILITOT 0.8  AST 20  ALT 18  ALKPHOS 99  PROT 6.6  ALBUMIN 4.0    TUMOR MARKERS: No results for input(s): "AFPTM", "CEA", "CA199", "CHROMGRNA" in the last 8760 hours.  Assessment and Plan: 81 year old male with history of asymptomatic, gradually enlarging (3.2-->3.7 cm) right upper pole renal cell carcinoma (T1a).  After 2 years of watchful waiting, he is motivated to pursue treatment.  We discussed locoregional options offered by Interventional Radiology, and he would be best suited by microwave ablation in my opinion based on mass location, size, and morphology.    Risks and benefits of image guided renal ablation was discussed with the patient including, but not limited to, failure to treat entire lesion, bleeding, infection, damage to adjacent structures, hematuria, urine leak, decrease in renal function or post procedural neuropathy.  All of the patient's questions were answered and the patient is agreeable to proceed.  Plan for CT and ultrasound guided right renal mass biopsy and microwave ablation at [facility] with general anesthesia.  Will prepare for overnight observation.     Electronically Signed: Suzette Battiest, MD 10/18/2021, 3:02 PM   I spent a total of  40 Minutes  in virtual telephone clinical consultation, greater than 50% of which was counseling/coordinating care for renal mass.

## 2021-10-25 ENCOUNTER — Other Ambulatory Visit (HOSPITAL_COMMUNITY): Payer: Self-pay | Admitting: Interventional Radiology

## 2021-10-25 DIAGNOSIS — C641 Malignant neoplasm of right kidney, except renal pelvis: Secondary | ICD-10-CM

## 2021-11-08 NOTE — Patient Instructions (Addendum)
SURGICAL WAITING ROOM VISITATION Patients having surgery or a procedure may have no more than 2 support people in the waiting area - these visitors may rotate.   Children under the age of 28 must have an adult with them who is not the patient. If the patient needs to stay at the hospital during part of their recovery, the visitor guidelines for inpatient rooms apply. Pre-op nurse will coordinate an appropriate time for 1 support person to accompany patient in pre-op.  This support person may not rotate.    Please refer to the [facility] website for the visitor guidelines for Inpatients (after your surgery is over and you are in a regular room).      Your procedure is scheduled on: 11-21-21   Report to [facility] Main Entrance    Report to admitting at 9:45 AM   Call this number if you have problems the morning of surgery [phone]   Do not eat food or drink liquids:After Midnight.          If you have questions, please contact your surgeon's office.   FOLLOW  ANY ADDITIONAL PRE OP INSTRUCTIONS YOU RECEIVED FROM YOUR SURGEON'S OFFICE!!!     Oral Hygiene is also important to reduce your risk of infection.                                    Remember - BRUSH YOUR TEETH THE MORNING OF SURGERY WITH YOUR REGULAR TOOTHPASTE   Do NOT smoke after Midnight   Take these medicines the morning of surgery with A SIP OF WATER:   Amlodipine  Doxazosin                              You may not have any metal on your body including jewelry, and body piercing             Do not wear lotions, powders, cologne, or deodorant              Men may shave face and neck.   Do not bring valuables to the hospital. [place].   Contacts, dentures or bridgework may not be worn into surgery.   Bring small overnight bag day of surgery.   DO NOT [place]. PHARMACY WILL DISPENSE MEDICATIONS LISTED ON YOUR MEDICATION LIST TO  YOU DURING YOUR ADMISSION [place]!              Please read over the following fact sheets you were given: IF [place]  If you received a COVID test during your pre-op visit  it is requested that you wear a mask when out in public, stay away from anyone that may not be feeling well and notify your surgeon if you develop symptoms. If you test positive for Covid or have been in contact with anyone that has tested positive in the last 10 days please notify you surgeon.  [place] - Preparing for Surgery Before surgery, you can play an important role.  Because skin is not sterile, your skin needs to be as free of germs as possible.  You can reduce the number of germs on your skin by washing with CHG (chlorahexidine gluconate) soap before surgery.  CHG is  an antiseptic cleaner which kills germs and bonds with the skin to continue killing germs even after washing. Please DO NOT use if you have an allergy to CHG or antibacterial soaps.  If your skin becomes reddened/irritated stop using the CHG and inform your nurse when you arrive at Short Stay. Do not shave (including legs and underarms) for at least 48 hours prior to the first CHG shower.  You may shave your face/neck.  Please follow these instructions carefully:  1.  Shower with CHG Soap the night before surgery and the  morning of surgery.  2.  If you choose to wash your hair, wash your hair first as usual with your normal  shampoo.  3.  After you shampoo, rinse your hair and body thoroughly to remove the shampoo.                             4.  Use CHG as you would any other liquid soap.  You can apply chg directly to the skin and wash.  Gently with a scrungie or clean washcloth.  5.  Apply the CHG Soap to your body ONLY FROM THE NECK DOWN.   Do   not use on face/ open                           Wound or open sores. Avoid contact with eyes, ears mouth and   genitals  (private parts).                       Wash face,  Genitals (private parts) with your normal soap.             6.  Wash thoroughly, paying special attention to the area where your    surgery  will be performed.  7.  Thoroughly rinse your body with warm water from the neck down.  8.  DO NOT shower/wash with your normal soap after using and rinsing off the CHG Soap.                9.  Pat yourself dry with a clean towel.            10.  Wear clean pajamas.            11.  Place clean sheets on your bed the night of your first shower and do not  sleep with pets. Day of Surgery : Do not apply any lotions/deodorants the morning of surgery.  Please wear clean clothes to the hospital/surgery center.  FAILURE TO FOLLOW THESE INSTRUCTIONS MAY RESULT IN THE CANCELLATION OF YOUR SURGERY  PATIENT SIGNATURE_________________________________  NURSE SIGNATURE__________________________________  ________________________________________________________________________     WHAT IS A BLOOD TRANSFUSION? Blood Transfusion Information  A transfusion is the replacement of blood or some of its parts. Blood is made up of multiple cells which provide different functions. Red blood cells carry oxygen and are used for blood loss replacement. White blood cells fight against infection. Platelets control bleeding. Plasma helps clot blood. Other blood products are available for specialized needs, such as hemophilia or other clotting disorders. BEFORE THE TRANSFUSION  Who gives blood for transfusions?  Healthy volunteers who are fully evaluated to make sure their blood is safe. This is blood bank blood. Transfusion therapy is the safest it has ever been in the practice of medicine. Before blood is taken from a donor, a  complete history is taken to make sure that person has no history of diseases nor engages in risky social behavior (examples are intravenous drug use or sexual activity with multiple partners). The  donor's travel history is screened to minimize risk of transmitting infections, such as malaria. The donated blood is tested for signs of infectious diseases, such as HIV and hepatitis. The blood is then tested to be sure it is compatible with you in order to minimize the chance of a transfusion reaction. If you or a relative donates blood, this is often done in anticipation of surgery and is not appropriate for emergency situations. It takes many days to process the donated blood. RISKS AND COMPLICATIONS Although transfusion therapy is very safe and saves many lives, the main dangers of transfusion include:  Getting an infectious disease. Developing a transfusion reaction. This is an allergic reaction to something in the blood you were given. Every precaution is taken to prevent this. The decision to have a blood transfusion has been considered carefully by your caregiver before blood is given. Blood is not given unless the benefits outweigh the risks. AFTER THE TRANSFUSION Right after receiving a blood transfusion, you will usually feel much better and more energetic. This is especially true if your red blood cells have gotten low (anemic). The transfusion raises the level of the red blood cells which carry oxygen, and this usually causes an energy increase. The nurse administering the transfusion will monitor you carefully for complications. HOME CARE INSTRUCTIONS  No special instructions are needed after a transfusion. You may find your energy is better. Speak with your caregiver about any limitations on activity for underlying diseases you may have. SEEK MEDICAL CARE IF:  Your condition is not improving after your transfusion. You develop redness or irritation at the intravenous (IV) site. SEEK IMMEDIATE MEDICAL CARE IF:  Any of the following symptoms occur over the next 12 hours: Shaking chills. You have a temperature by mouth above 102 F (38.9 C), not controlled by medicine. Chest, back,  or muscle pain. People around you feel you are not acting correctly or are confused. Shortness of breath or difficulty breathing. Dizziness and fainting. You get a rash or develop hives. You have a decrease in urine output. Your urine turns a dark color or changes to pink, red, or brown. Any of the following symptoms occur over the next 10 days: You have a temperature by mouth above 102 F (38.9 C), not controlled by medicine. Shortness of breath. Weakness after normal activity. The white part of the eye turns yellow (jaundice). You have a decrease in the amount of urine or are urinating less often. Your urine turns a dark color or changes to pink, red, or brown. Document Released: 01/05/2000 Document Revised: 04/01/2011 Document Reviewed: 08/24/2007 [employer] Patient Information 2014 [place], [place].  _______________________________________________________________________

## 2021-11-08 NOTE — Progress Notes (Addendum)
COVID Vaccine Completed:  Yes  Date of COVID positive in last 90 days:  No  PCP - Miguel Aschoff, MD Cardiologist - Bartholome Bill, MD  Chest x-ray - N/A EKG - 11-13-21 Epic  Stress Test - 2019 [facility] ECHO - 2019 [facility] Cardiac Cath - N/A Pacemaker/ICD device last checked: Spinal Cord Stimulator:  N/A  Bowel Prep - N/A  Sleep Study - Yes, +sleep apnea CPAP - No  Prediabetes Fasting Blood Sugar -  Checks Blood Sugar - does not check   Blood Thinner Instructions:  N/A Aspirin Instructions: Last Dose:  Activity level:  Can go up a flight of stairs and perform activities of daily living without stopping and without symptoms of chest pain or shortness of breath.  Able to exercise without symptoms, walks a mile daily.  Anesthesia review:  Pulmonary HTN,,frequent PVCs, LVH, mod mitral insufficiency, mod tricuspid insufficiency,  HTN, OSA  Patient denies shortness of breath, fever, cough and chest pain at PAT appointment  Patient verbalized understanding of instructions that were given to them at the PAT appointment. Patient was also instructed that they will need to review over the PAT instructions again at home before surgery.

## 2021-11-09 ENCOUNTER — Other Ambulatory Visit: Payer: Self-pay | Admitting: Student

## 2021-11-09 DIAGNOSIS — C641 Malignant neoplasm of right kidney, except renal pelvis: Secondary | ICD-10-CM

## 2021-11-09 NOTE — Progress Notes (Signed)
Called radiology to request pre op orders in epic.

## 2021-11-09 NOTE — Progress Notes (Signed)
Surgery orders requested with radiology. 

## 2021-11-13 ENCOUNTER — Other Ambulatory Visit: Payer: Self-pay

## 2021-11-13 ENCOUNTER — Encounter (HOSPITAL_COMMUNITY): Payer: Self-pay

## 2021-11-13 ENCOUNTER — Encounter (HOSPITAL_COMMUNITY)
Admission: RE | Admit: 2021-11-13 | Discharge: 2021-11-13 | Disposition: A | Discharge status: Home / Self Care | Payer: PPO | Source: Ambulatory Visit | Attending: Interventional Radiology | Admitting: Interventional Radiology

## 2021-11-13 VITALS — BP 149/72 | HR 68 | Temp 98.2°F | Resp 14 | Ht 70.0 in | Wt 254.4 lb

## 2021-11-13 DIAGNOSIS — Z01818 Encounter for other preprocedural examination: Secondary | ICD-10-CM | POA: Insufficient documentation

## 2021-11-13 DIAGNOSIS — I2510 Atherosclerotic heart disease of native coronary artery without angina pectoris: Secondary | ICD-10-CM | POA: Insufficient documentation

## 2021-11-13 DIAGNOSIS — C641 Malignant neoplasm of right kidney, except renal pelvis: Secondary | ICD-10-CM | POA: Diagnosis not present

## 2021-11-13 DIAGNOSIS — R7303 Prediabetes: Secondary | ICD-10-CM | POA: Insufficient documentation

## 2021-11-13 HISTORY — DX: Prediabetes: R73.03

## 2021-11-13 HISTORY — DX: Headache, unspecified: R51.9

## 2021-11-13 HISTORY — DX: Unspecified malignant neoplasm of skin of other part of trunk: C44.509

## 2021-11-13 LAB — CBC WITH DIFFERENTIAL/PLATELET
Abs Immature Granulocytes: 0.01 10*3/uL (ref 0.00–0.07)
Basophils Absolute: 0.1 10*3/uL (ref 0.0–0.1)
Basophils Relative: 1 %
Eosinophils Absolute: 0.1 10*3/uL (ref 0.0–0.5)
Eosinophils Relative: 2 %
HCT: 42.4 % (ref 39.0–52.0)
Hemoglobin: 14.2 g/dL (ref 13.0–17.0)
Immature Granulocytes: 0 %
Lymphocytes Relative: 28 %
Lymphs Abs: 1.6 10*3/uL (ref 0.7–4.0)
MCH: 33.4 pg (ref 26.0–34.0)
MCHC: 33.5 g/dL (ref 30.0–36.0)
MCV: 99.8 fL (ref 80.0–100.0)
Monocytes Absolute: 0.5 10*3/uL (ref 0.1–1.0)
Monocytes Relative: 8 %
Neutro Abs: 3.6 10*3/uL (ref 1.7–7.7)
Neutrophils Relative %: 61 %
Platelets: 214 10*3/uL (ref 150–400)
RBC: 4.25 MIL/uL (ref 4.22–5.81)
RDW: 13.5 % (ref 11.5–15.5)
WBC: 5.8 10*3/uL (ref 4.0–10.5)
nRBC: 0 % (ref 0.0–0.2)

## 2021-11-13 LAB — BASIC METABOLIC PANEL
Anion gap: 7 (ref 5–15)
BUN: 13 mg/dL (ref 8–23)
CO2: 28 mmol/L (ref 22–32)
Calcium: 8.6 mg/dL — ABNORMAL LOW (ref 8.9–10.3)
Chloride: 107 mmol/L (ref 98–111)
Creatinine, Ser: 0.98 mg/dL (ref 0.61–1.24)
GFR, Estimated: >60 mL/min (ref >60)
Glucose, Bld: 98 mg/dL (ref 70–99)
Potassium: 4.3 mmol/L (ref 3.5–5.1)
Sodium: 142 mmol/L (ref 135–145)

## 2021-11-13 LAB — GLUCOSE, CAPILLARY: Glucose-Capillary: 99 mg/dL (ref 70–99)

## 2021-11-13 LAB — HEMOGLOBIN A1C
Hgb A1c MFr Bld: 5.3 % (ref 4.8–5.6)
Mean Plasma Glucose: 105.41 mg/dL

## 2021-11-14 NOTE — Progress Notes (Signed)
Anesthesia Chart Review   Case: [id number] Date/Time: 11/21/21 1145   Procedure: RADIOLOGY WITH ANESTHESIA CT MICROWAVE ABLATION   Anesthesia type: General   Pre-op diagnosis: RIGHT RENAL CELL CARCINOMA   [facility]   Surgeons: Suzette Battiest, MD       DISCUSSION:81 y.o. former smoker with h/o HTN, sleep apnea, pulmonary HTN, PVCs, moderate mitral insufficiency, moderate tricuspid insufficiency, right renal cell carcinoma scheduled for above procedure 11/21/2021 with Dr. Ruthann Cancer.   Pt last seen by cardiology 04/19/21. Stable at this visit with 1 year follow up recommended.   Anticipate pt can proceed with planned procedure barring acute status change.   VS: BP (!) 149/72   Pulse 68   Temp 36.8 C (Oral)   Resp 14   Ht '5\' 10"'$  (1.778 m)   Wt 115.4 kg   SpO2 98%   BMI 36.50 kg/m   PROVIDERS: Jerrol Banana., MD is PCP   Bartholome Bill, MD is Cardiologist  LABS: Labs reviewed: Acceptable for surgery. (all labs ordered are listed, but only abnormal results are displayed)  Labs Reviewed  BASIC METABOLIC PANEL - Abnormal; Notable for the following components:      Result Value   Calcium 8.6 (*)    All other components within normal limits  CBC WITH DIFFERENTIAL/PLATELET  HEMOGLOBIN A1C  GLUCOSE, CAPILLARY  TYPE AND SCREEN     IMAGES:   EKG:   CV: NM stress 2019- Normal treadmill EKG without evidence of ischemia or arrhythmia Normal myocardial perfusion without evidence of myocardial ischemia  Echo 04/21/2017 INTERPRETATION  NORMAL LEFT VENTRICULAR SYSTOLIC FUNCTION   WITH MODERATE LVH  NORMAL RIGHT VENTRICULAR SYSTOLIC FUNCTION  MODERATE VALVULAR REGURGITATION (See above)  NO VALVULAR STENOSIS  MODERATE MR  MILD TR, PR  EF 55%  Past Medical History:  Diagnosis Date   Arthritis    Cancer of skin of back    Collar bone fracture    x 2   Colon polyp 2003   Dysrhythmia    occasionally skips a beat and has bradycardia(in the30's today)    Edema    legs/feet occas   Headache    History of hiatal hernia    Hypertension    Pre-diabetes    Renal mass, right    Sleep apnea    20 years ago and did not wear cpap long    Past Surgical History:  Procedure Laterality Date   BUNIONECTOMY WITH HAMMERTOE RECONSTRUCTION Left 2011   CHOLECYSTECTOMY  1970   CIRCUMCISION N/A 01/09/2016   Procedure: CIRCUMCISION ADULT;  Surgeon: Hollice Espy, MD;  Location: [facility] ORS;  Service: Urology;  Laterality: N/A;   COLONOSCOPY  03/22/2008   Dr Bary Castilla   FOOT SURGERY Left    x 2 pins   FRACTURE SURGERY Left 1970   pins in left big toe and first toe   HERNIA REPAIR  1970   IR RADIOLOGIST EVAL & MGMT  10/18/2021   OTHER SURGICAL HISTORY     transanal excision of a villous adenoma   SKIN CANCER EXCISION      MEDICATIONS:  amLODipine (NORVASC) 2.5 MG tablet   clobetasol ointment (TEMOVATE) 0.05 %   doxazosin (CARDURA) 8 MG tablet   furosemide (LASIX) 20 MG tablet   ibuprofen (ADVIL,MOTRIN) 800 MG tablet   lisinopril (ZESTRIL) 20 MG tablet   Multiple Vitamin tablet   sildenafil (REVATIO) 20 MG tablet   No current facility-administered medications for this encounter.  Konrad Felix Ward, PA-C [facility] [phone]

## 2021-11-20 NOTE — H&P (Deleted)
  The note originally documented on this encounter has been moved the the encounter in which it belongs.  

## 2021-11-20 NOTE — H&P (Signed)
Chief Complaint: Patient was seen in consultation today for right renal cell carcinoma at the request of Dr. Bernardo Heater  Referring Physician(s): Dr. Bernardo Heater  Supervising Physician: Ruthann Cancer  Patient Status: [facility] - Out-pt  History of Present Illness: Patrick Thornton is a 81 y.o. male diagnosed with a right renal mass 2021 after going renal ultrasound for right flank pain.  Patient was further evaluated with MRI and that showed enhancing exophytic upper pole mass measuring 3.2 cm.  The mass has been followed by ultrasound since it has grown to 3.7 cm.  Patient was referred to Dr. Serafina Royals, IR by Dr. Bernardo Heater for treatment options.  Patient consulted with Dr. Serafina Royals 10/18/2021 by telephone.  At that time patient was advised he would be better suited for microwave renal ablation as well as renal mass biopsy under general anesthesia to be observed overnight.  Patient stable, chills, chest pain, shortness of breath, nausea, emesis.  He endorses frequent urine with urgency and intermittent right flank pressure. He is NPO per order. Patient uses blood thinning medications.  Past Medical History:  Diagnosis Date   Arthritis    Cancer of skin of back    Collar bone fracture    x 2   Colon polyp 2003   Dysrhythmia    occasionally skips a beat and has bradycardia(in the30's today)   Edema    legs/feet occas   Headache    History of hiatal hernia    Hypertension    Pre-diabetes    Renal mass, right    Sleep apnea    20 years ago and did not wear cpap long    Past Surgical History:  Procedure Laterality Date   BUNIONECTOMY WITH HAMMERTOE RECONSTRUCTION Left 2011   [place] N/A 01/09/2016   Procedure: CIRCUMCISION ADULT;  Surgeon: Hollice Espy, MD;  Location: [facility] ORS;  Service: Urology;  Laterality: N/A;   COLONOSCOPY  03/22/2008   Dr Bary Castilla   FOOT SURGERY Left    x 2 pins   FRACTURE SURGERY Left 1970   pins in left big toe and first toe   HERNIA  REPAIR  1970   IR RADIOLOGIST EVAL & MGMT  10/18/2021   OTHER SURGICAL HISTORY     transanal excision of a villous adenoma   SKIN CANCER EXCISION      Allergies: Tape  Medications: Prior to Admission medications   Medication Sig Start Date End Date Taking? Authorizing Provider  amLODipine (NORVASC) 2.5 MG tablet Take 2.5 mg by mouth daily. 10/13/19   [provider]  clobetasol ointment (TEMOVATE) 1.61 % Apply 1 application  topically daily as needed (rash). 12/03/18   [provider]  doxazosin (CARDURA) 8 MG tablet TAKE 1 TABLET BY MOUTH ONCE DAILY 06/12/21   Jerrol Banana., MD  furosemide (LASIX) 20 MG tablet TAKE 1 TABLET BY MOUTH ONCE DAILY Patient taking differently: Take 20 mg by mouth daily as needed for fluid. 12/05/20   Jerrol Banana., MD  ibuprofen (ADVIL,MOTRIN) 800 MG tablet TAKE 1 TABLET BY MOUTH TWICE A DAY AS NEEDED Patient not taking: Reported on 11/08/2021 11/29/15   Jerrol Banana., MD  lisinopril (ZESTRIL) 20 MG tablet Take 1 tablet (20 mg total) by mouth daily. 08/06/21   Jerrol Banana., MD  Multiple Vitamin tablet Take 1 tablet by mouth daily.  07/30/11   [provider]  sildenafil (REVATIO) 20 MG tablet Take 1-5 tablets by mouth daily.  Patient taking differently: Take 20-100 mg by mouth daily as needed (ED). 07/11/21   Jerrol Banana., MD     Family History  Problem Relation Age of Onset   Stroke Mother    Cancer Father        melanoma   Diabetes Father    Hypertension Father    Heart attack Father    Hyperlipidemia Brother    Heart attack Brother    Diabetes Son    COPD Son    Obesity Son    Heart attack Son    Hepatitis C Son    Cirrhosis Son    Kidney failure Son    CAD Brother    Diverticulitis Daughter    Heart disease Daughter    Heart attack Daughter     Social History   Socioeconomic History   Marital status: Widowed    Spouse name: Sunday Spillers   Number of children: 4   Years of  education: Not on file   Highest education level: Some college, no degree  Occupational History   Occupation: retired  Tobacco Use   Smoking status: Former    Packs/day: 1.00    Years: 40.00    Total pack years: 40.00    Types: Cigarettes    Quit date: 01/04/1994    Years since quitting: 27.8   Smokeless tobacco: Never  Vaping Use   Vaping Use: Never used  Substance and Sexual Activity   Alcohol use: No   Drug use: No   Sexual activity: Never  Other Topics Concern   Not on file  Social History Narrative   Not on file   Social Determinants of Health   Financial Resource Strain: Low Risk  (02/07/2021)   Overall Financial Resource Strain (CARDIA)    Difficulty of Paying Living Expenses: Not hard at all  Food Insecurity: No Food Insecurity (02/07/2021)   Hunger Vital Sign    Worried About Running Out of Food in the Last Year: Never true    [place] in the Last Year: Never true  Transportation Needs: No Transportation Needs (02/07/2021)   PRAPARE - Hydrologist (Medical): No    Lack of Transportation (Non-Medical): No  Physical Activity: Insufficiently Active (02/07/2021)   Exercise Vital Sign    Days of Exercise per Week: 4 days    Minutes of Exercise per Session: 30 min  Stress: No Stress Concern Present (02/07/2021)   [place]    Feeling of Stress : Not at all  Social Connections: Moderately Isolated (02/07/2021)   Social Connection and Isolation Panel [NHANES]    Frequency of Communication with Friends and Family: More than three times a week    Frequency of Social Gatherings with Friends and Family: Once a week    Attends Religious Services: Never    Marine scientist or Organizations: Yes    Attends Archivist Meetings: Never    Marital Status: Widowed    Review of Systems: A 12 point ROS discussed and pertinent positives are indicated in the HPI  above.  All other systems are negative.  Review of Systems  Constitutional:  Negative for appetite change, chills, fatigue and fever.  Respiratory:  Negative for shortness of breath.   Cardiovascular:  Negative for chest pain and leg swelling.  Gastrointestinal:  Negative for abdominal pain, nausea and vomiting.  Genitourinary:  Positive for frequency and urgency. Negative for  flank pain and hematuria.  Neurological:  Positive for headaches. Negative for dizziness and weakness.  Hematological:  Bruises/bleeds easily.    Vital Signs: BP 148/73, heart rate 72, respiratory rate 18, 97.7, 96% RA    Physical Exam Vitals reviewed.  Constitutional:      General: He is not in acute distress.    Appearance: Normal appearance. He is not ill-appearing.  HENT:     Head: Normocephalic and atraumatic.     Mouth/Throat:     Mouth: Mucous membranes are dry.     Pharynx: Oropharynx is clear.  Eyes:     Extraocular Movements: Extraocular movements intact.  Cardiovascular:     Rate and Rhythm: Normal rate and regular rhythm.     Pulses: Normal pulses.     Heart sounds: Normal heart sounds. No murmur heard. Pulmonary:     Effort: Pulmonary effort is normal. No respiratory distress.     Breath sounds: Normal breath sounds.  Abdominal:     General: Bowel sounds are normal. There is no distension.     Tenderness: There is no abdominal tenderness. There is no guarding.  Musculoskeletal:     Right lower leg: No edema.     Left lower leg: No edema.  Skin:    General: Skin is warm and dry.  Neurological:     Mental Status: He is alert and oriented to person, place, and time.  Psychiatric:        Mood and Affect: Mood normal.        Behavior: Behavior normal.        Thought Content: Thought content normal.        Judgment: Judgment normal.     Imaging: No results found.  Labs:  CBC: Recent Labs    07/11/21 1558 11/13/21 1015  WBC 6.3 5.8  HGB 14.3 14.2  HCT 41.0 42.4  PLT 235  214    COAGS: Recent Labs    11/21/21 1020  INR 1.1    BMP: Recent Labs    07/11/21 1558 11/13/21 1015  NA 141 142  K 4.2 4.3  CL 102 107  CO2 25 28  GLUCOSE 96 98  BUN 12 13  CALCIUM 8.7 8.6*  CREATININE 1.04 0.98  GFRNONAA  --  >60    LIVER FUNCTION TESTS: Recent Labs    07/11/21 1558  BILITOT 0.8  AST 20  ALT 18  ALKPHOS 99  PROT 6.6  ALBUMIN 4.0    TUMOR MARKERS: No results for input(s): "AFPTM", "CEA", "CA199", "CHROMGRNA" in the last 8760 hours.  Assessment and Plan: 81 year old male with right upper pole renal cell carcinoma presents for microwave ablation of renal mass and renal mass biopsy under general anesthesia.  Patient resting on stretcher. He is alert and oriented, calm and pleasant. He is in no distress.  Risks and benefits of right renal mass microwave ablation with biopsy under general anesthesia discussed with the patient including, but not limited to bleeding, infection, renal failure, pneumothorax or damage to adjacent structures.  All of the patient's questions were answered, patient is agreeable to proceed. Consent signed and in chart.   Thank you for this interesting consult.  I greatly enjoyed meeting Patrick Thornton and look forward to participating in their care.  A copy of this report was sent to the requesting provider on this date.  Electronically Signed: Tyson Alias, NP 11/21/2021, 11:30 AM   I spent a total of 20 minutes in face to face  in clinical consultation, greater than 50% of which was counseling/coordinating care for right renal cell carcinoma.

## 2021-11-21 ENCOUNTER — Ambulatory Visit (HOSPITAL_COMMUNITY): Payer: PPO | Admitting: Physician Assistant

## 2021-11-21 ENCOUNTER — Other Ambulatory Visit: Payer: Self-pay

## 2021-11-21 ENCOUNTER — Encounter (HOSPITAL_COMMUNITY)
Admission: RE | Disposition: A | Discharge status: Home / Self Care | Payer: Self-pay | Source: Ambulatory Visit | Attending: Interventional Radiology

## 2021-11-21 ENCOUNTER — Ambulatory Visit (HOSPITAL_COMMUNITY)
Admission: RE | Admit: 2021-11-21 | Discharge: 2021-11-21 | Disposition: A | Discharge status: Home / Self Care | Payer: PPO | Source: Ambulatory Visit | Attending: Interventional Radiology | Admitting: Interventional Radiology

## 2021-11-21 ENCOUNTER — Encounter (HOSPITAL_COMMUNITY): Payer: Self-pay

## 2021-11-21 ENCOUNTER — Encounter (HOSPITAL_COMMUNITY): Payer: Self-pay | Admitting: Interventional Radiology

## 2021-11-21 ENCOUNTER — Ambulatory Visit (HOSPITAL_BASED_OUTPATIENT_CLINIC_OR_DEPARTMENT_OTHER): Payer: PPO | Admitting: Certified Registered Nurse Anesthetist

## 2021-11-21 DIAGNOSIS — G4730 Sleep apnea, unspecified: Secondary | ICD-10-CM | POA: Insufficient documentation

## 2021-11-21 DIAGNOSIS — C641 Malignant neoplasm of right kidney, except renal pelvis: Secondary | ICD-10-CM | POA: Diagnosis not present

## 2021-11-21 DIAGNOSIS — N2889 Other specified disorders of kidney and ureter: Secondary | ICD-10-CM | POA: Diagnosis not present

## 2021-11-21 DIAGNOSIS — Z87891 Personal history of nicotine dependence: Secondary | ICD-10-CM

## 2021-11-21 DIAGNOSIS — K219 Gastro-esophageal reflux disease without esophagitis: Secondary | ICD-10-CM | POA: Insufficient documentation

## 2021-11-21 DIAGNOSIS — I10 Essential (primary) hypertension: Secondary | ICD-10-CM | POA: Insufficient documentation

## 2021-11-21 DIAGNOSIS — D49511 Neoplasm of unspecified behavior of right kidney: Secondary | ICD-10-CM | POA: Diagnosis not present

## 2021-11-21 DIAGNOSIS — Z8719 Personal history of other diseases of the digestive system: Secondary | ICD-10-CM | POA: Insufficient documentation

## 2021-11-21 DIAGNOSIS — R7303 Prediabetes: Secondary | ICD-10-CM

## 2021-11-21 HISTORY — PX: RADIOLOGY WITH ANESTHESIA: SHX6223

## 2021-11-21 LAB — ABO/RH: ABO/RH(D): A POS

## 2021-11-21 LAB — PROTIME-INR
INR: 1.1 (ref 0.8–1.2)
Prothrombin Time: 14 seconds (ref 11.4–15.2)

## 2021-11-21 LAB — TYPE AND SCREEN
ABO/RH(D): A POS
Antibody Screen: NEGATIVE

## 2021-11-21 SURGERY — RADIOLOGY WITH ANESTHESIA
Anesthesia: General

## 2021-11-21 MED ORDER — PROPOFOL 10 MG/ML IV BOLUS
INTRAVENOUS | Status: AC
  Filled 2021-11-21: qty 20

## 2021-11-21 MED ORDER — SUGAMMADEX SODIUM 200 MG/2ML IV SOLN
INTRAVENOUS | Status: DC | PRN
  Administered 2021-11-21: 230.6 mg via INTRAVENOUS

## 2021-11-21 MED ORDER — SODIUM CHLORIDE 0.9 % IV SOLN
2.0000 g | INTRAVENOUS | Status: AC
  Administered 2021-11-21: 2 g via INTRAVENOUS
  Filled 2021-11-21: qty 2

## 2021-11-21 MED ORDER — EPHEDRINE SULFATE-NACL 50-0.9 MG/10ML-% IV SOSY
PREFILLED_SYRINGE | INTRAVENOUS | Status: DC | PRN
  Administered 2021-11-21: 10 mg via INTRAVENOUS
  Administered 2021-11-21 (×2): 5 mg via INTRAVENOUS
  Administered 2021-11-21 (×3): 10 mg via INTRAVENOUS

## 2021-11-21 MED ORDER — ROCURONIUM BROMIDE 10 MG/ML (PF) SYRINGE
PREFILLED_SYRINGE | INTRAVENOUS | Status: DC | PRN
  Administered 2021-11-21: 50 mg via INTRAVENOUS
  Administered 2021-11-21: 30 mg via INTRAVENOUS
  Administered 2021-11-21: 10 mg via INTRAVENOUS

## 2021-11-21 MED ORDER — LIDOCAINE 2% (20 MG/ML) 5 ML SYRINGE
INTRAMUSCULAR | Status: DC | PRN
  Administered 2021-11-21: 100 mg via INTRAVENOUS

## 2021-11-21 MED ORDER — CHLORHEXIDINE GLUCONATE 0.12 % MT SOLN
15.0000 mL | Freq: Once | OROMUCOSAL | Status: AC
  Administered 2021-11-21: 15 mL via OROMUCOSAL
  Filled 2021-11-21: qty 15

## 2021-11-21 MED ORDER — SODIUM CHLORIDE 0.9 % IV SOLN: INTRAVENOUS | Status: DC

## 2021-11-21 MED ORDER — LACTATED RINGERS IV SOLN: INTRAVENOUS | Status: DC

## 2021-11-21 MED ORDER — HYDROCODONE-ACETAMINOPHEN 5-325 MG PO TABS: 1.0000 | ORAL_TABLET | ORAL | Status: DC | PRN

## 2021-11-21 MED ORDER — HYDROMORPHONE HCL 1 MG/ML IJ SOLN: 0.2500 mg | INTRAMUSCULAR | Status: DC | PRN

## 2021-11-21 MED ORDER — ORAL CARE MOUTH RINSE: 15.0000 mL | Freq: Once | OROMUCOSAL | Status: AC

## 2021-11-21 MED ORDER — PROPOFOL 10 MG/ML IV BOLUS
INTRAVENOUS | Status: DC | PRN
  Administered 2021-11-21: 160 mg via INTRAVENOUS

## 2021-11-21 MED ORDER — PHENYLEPHRINE HCL-NACL 20-0.9 MG/250ML-% IV SOLN
INTRAVENOUS | Status: AC
  Filled 2021-11-21: qty 250

## 2021-11-21 MED ORDER — FENTANYL CITRATE (PF) 250 MCG/5ML IJ SOLN
INTRAMUSCULAR | Status: AC
  Filled 2021-11-21: qty 5

## 2021-11-21 MED ORDER — GLYCOPYRROLATE PF 0.2 MG/ML IJ SOSY
PREFILLED_SYRINGE | INTRAMUSCULAR | Status: DC | PRN
  Administered 2021-11-21: .2 mg via INTRAVENOUS

## 2021-11-21 MED ORDER — ONDANSETRON HCL 4 MG/2ML IJ SOLN
INTRAMUSCULAR | Status: DC | PRN
  Administered 2021-11-21: 4 mg via INTRAVENOUS

## 2021-11-21 MED ORDER — PHENYLEPHRINE HCL-NACL 20-0.9 MG/250ML-% IV SOLN
INTRAVENOUS | Status: DC | PRN
  Administered 2021-11-21: 20 ug/min via INTRAVENOUS

## 2021-11-21 MED ORDER — FENTANYL CITRATE (PF) 100 MCG/2ML IJ SOLN
INTRAMUSCULAR | Status: DC | PRN
  Administered 2021-11-21: 25 ug via INTRAVENOUS
  Administered 2021-11-21: 125 ug via INTRAVENOUS

## 2021-11-21 MED ORDER — DEXAMETHASONE SODIUM PHOSPHATE 10 MG/ML IJ SOLN
INTRAMUSCULAR | Status: DC | PRN
  Administered 2021-11-21: 4 mg via INTRAVENOUS

## 2021-11-21 NOTE — Procedures (Signed)
Interventional Radiology Procedure Note  Procedure:  1) Ultrasound guided right renal mass biopsy 2) Ultrasound and CT guided right renal mass microwave ablation  Findings: Please refer to procedural dictation for full description. 18 ga core x 3 of right renal mass.  2 15 ga MWA probe ablation of mass.  Complications: None immediate  Estimated Blood Loss: < 5 mL  Recommendations: Strict 4 hour bedrest.  Follow up Pathology results. Plan for discharge home if stable after bedrest complete. Follow up in IR clinic in 1 month with repeat MRI.   Ruthann Cancer, MD Pager: [phone]

## 2021-11-21 NOTE — Transfer of Care (Signed)
Immediate Anesthesia Transfer of Care Note  Patient: [name]  Procedure(s) Performed: RADIOLOGY WITH ANESTHESIA CT MICROWAVE ABLATION  Patient Location: PACU  Anesthesia Type:General  Level of Consciousness: drowsy and patient cooperative  Airway & Oxygen Therapy: Patient Spontanous Breathing and Patient connected to face mask oxygen  Post-op Assessment: Report given to RN and Post -op Vital signs reviewed and stable  Post vital signs: Reviewed and stable  Last Vitals:  Vitals Value Taken Time  BP 153/92 11/21/21 1434  Temp    Pulse 74 11/21/21 1437  Resp 6 11/21/21 1437  SpO2 100 % 11/21/21 1437  Vitals shown include unvalidated device data.  Last Pain:  Vitals:   11/21/21 1009  TempSrc:   PainSc: 0-No pain         Complications: No notable events documented.

## 2021-11-21 NOTE — Anesthesia Preprocedure Evaluation (Signed)
Anesthesia Evaluation  Patient identified by MRN, date of birth, ID band Patient awake    Reviewed: Allergy & Precautions, NPO status   Airway Mallampati: II       Dental   Pulmonary sleep apnea , former smoker,    breath sounds clear to auscultation       Cardiovascular hypertension, + dysrhythmias  Rhythm:Regular Rate:Normal     Neuro/Psych  Headaches,    GI/Hepatic Neg liver ROS, hiatal hernia, GERD  ,Hx noted Dr. Nyoka Cowden   Endo/Other  negative endocrine ROS  Renal/GU negative Renal ROS     Musculoskeletal   Abdominal   Peds  Hematology   Anesthesia Other Findings   Reproductive/Obstetrics                             Anesthesia Physical Anesthesia Plan  ASA: 3  Anesthesia Plan: General   Post-op Pain Management:    Induction: Intravenous  PONV Risk Score and Plan: 3 and Ondansetron, Dexamethasone, Midazolam and Treatment may vary due to age or medical condition  Airway Management Planned: Oral ETT  Additional Equipment:   Intra-op Plan:   Post-operative Plan: Extubation in OR  Informed Consent:     Dental advisory given  Plan Discussed with: CRNA and Anesthesiologist  Anesthesia Plan Comments:         Anesthesia Quick Evaluation

## 2021-11-21 NOTE — Sedation Documentation (Signed)
Unable to place foley du to strictured urethral meatus.

## 2021-11-21 NOTE — Anesthesia Procedure Notes (Signed)
Procedure Name: Intubation Date/Time: 11/21/2021 12:09 PM  Performed by: Montel Clock, CRNAPre-anesthesia Checklist: Patient identified, Emergency Drugs available, Suction available, Patient being monitored and Timeout performed Patient Re-evaluated:Patient Re-evaluated prior to induction Oxygen Delivery Method: Circle system utilized Preoxygenation: Pre-oxygenation with 100% oxygen Induction Type: IV induction Ventilation: Mask ventilation without difficulty Laryngoscope Size: Mac and 3 Grade View: Grade I Tube type: Oral Tube size: 7.5 mm Number of attempts: 1 Airway Equipment and Method: Stylet Placement Confirmation: ETT inserted through vocal cords under direct vision, positive ETCO2 and breath sounds checked- equal and bilateral Secured at: 23 cm Tube secured with: Tape Dental Injury: Teeth and Oropharynx as per pre-operative assessment

## 2021-11-22 ENCOUNTER — Encounter (HOSPITAL_COMMUNITY): Payer: Self-pay | Admitting: Interventional Radiology

## 2021-11-23 LAB — SURGICAL PATHOLOGY

## 2021-11-23 NOTE — Anesthesia Postprocedure Evaluation (Signed)
Anesthesia Post Note  Patient: Patrick Thornton  Procedure(s) Performed: RADIOLOGY WITH ANESTHESIA CT MICROWAVE ABLATION     Patient location during evaluation: PACU Anesthesia Type: General Level of consciousness: awake Pain management: pain level controlled Vital Signs Assessment: post-procedure vital signs reviewed and stable Respiratory status: spontaneous breathing Cardiovascular status: stable Postop Assessment: no apparent nausea or vomiting Anesthetic complications: no   No notable events documented.  Last Vitals:  Vitals:   11/21/21 1700 11/21/21 1800  BP: (!) 146/75 130/89  Pulse: 75 77  Resp: 16 15  Temp:    SpO2: 97% 98%    Last Pain:  Vitals:   11/21/21 1800  TempSrc:   PainSc: 0-No pain                 Milayah Krell

## 2021-11-26 ENCOUNTER — Other Ambulatory Visit: Payer: Self-pay | Admitting: Interventional Radiology

## 2021-11-26 DIAGNOSIS — C641 Malignant neoplasm of right kidney, except renal pelvis: Secondary | ICD-10-CM

## 2021-12-06 ENCOUNTER — Telehealth: Payer: Self-pay | Admitting: Family Medicine

## 2021-12-06 MED ORDER — DOXAZOSIN MESYLATE 8 MG PO TABS: 8.0000 mg | ORAL_TABLET | Freq: Every day | ORAL | 0 refills | Status: AC

## 2021-12-06 NOTE — Telephone Encounter (Signed)
[place] faxed refill request for the following medications:   doxazosin (CARDURA) 8 MG tablet    Please advise.

## 2021-12-07 DIAGNOSIS — H43813 Vitreous degeneration, bilateral: Secondary | ICD-10-CM | POA: Diagnosis not present

## 2021-12-24 ENCOUNTER — Ambulatory Visit
Admission: RE | Admit: 2021-12-24 | Discharge: 2021-12-24 | Disposition: A | Discharge status: Home / Self Care | Payer: PPO | Source: Ambulatory Visit | Attending: Interventional Radiology | Admitting: Interventional Radiology

## 2021-12-24 DIAGNOSIS — C641 Malignant neoplasm of right kidney, except renal pelvis: Secondary | ICD-10-CM | POA: Diagnosis not present

## 2021-12-24 DIAGNOSIS — K7689 Other specified diseases of liver: Secondary | ICD-10-CM | POA: Diagnosis not present

## 2021-12-24 DIAGNOSIS — Z85528 Personal history of other malignant neoplasm of kidney: Secondary | ICD-10-CM | POA: Diagnosis not present

## 2021-12-24 DIAGNOSIS — N289 Disorder of kidney and ureter, unspecified: Secondary | ICD-10-CM | POA: Diagnosis not present

## 2021-12-24 MED ORDER — GADOBUTROL 1 MMOL/ML IV SOLN
10.0000 mL | Freq: Once | INTRAVENOUS | Status: AC | PRN
  Administered 2021-12-24: 10 mL via INTRAVENOUS

## 2021-12-27 NOTE — Progress Notes (Signed)
Reason for follow up: 1 month after right renal mass microwave ablation  Referring Physician(s): Stoioff,Scott C   History of Present Illness: Patrick Thornton is a 81 y.o. male with history of renal mass to presents via virtual telephone clinic visit at the gracious referral of Dr. Bernardo Heater for consideration of renal mass ablation.  Mr. Hair was first diagnosed with a right renal mass in 2021 after undergoing a renal ultrasound for right flank pain.  This was further evaluated a couple months later with an MR which showed an enhancing exophytic upper pole mass measuring up to 3.2 cm.  The mass has been followed by ultrasound since then, and has grown to 3.7 cm.  Given recent increase size, he is motivated to pursue intervention.  He remains asymptomatic without any right flank or abdominal pain, no hematuria.  No recent fevers, chills, chest pain, shortness of breath, nausea, vomiting, or diarrhea.    The patient presents today via virtual telephone clinic visit for follow up after renal mass microwave ablation.   Pathology resulted as oncocytoma.  He is recovering very well.  He denies any back pain, abdominal pain, hematuria, fevers, chills, chest pain, or shortness of breath.  He underwent initial surveillance MRI on 12/24/21.    Past Medical History:  Diagnosis Date   Arthritis    Cancer of skin of back    Collar bone fracture    x 2   Colon polyp 2003   Dysrhythmia    occasionally skips a beat and has bradycardia(in the30's today)   Edema    legs/feet occas   Headache    History of hiatal hernia    Hypertension    Pre-diabetes    Renal mass, right    Sleep apnea    20 years ago and did not wear cpap long    Past Surgical History:  Procedure Laterality Date   BUNIONECTOMY WITH HAMMERTOE RECONSTRUCTION Left 2011   [place] N/A 01/09/2016   Procedure: CIRCUMCISION ADULT;  Surgeon: Hollice Espy, MD;  Location: [facility] ORS;  Service: Urology;   Laterality: N/A;   COLONOSCOPY  03/22/2008   Dr Bary Castilla   FOOT SURGERY Left    x 2 pins   FRACTURE SURGERY Left 1970   pins in left big toe and first toe   HERNIA REPAIR  1970   IR RADIOLOGIST EVAL & MGMT  10/18/2021   OTHER SURGICAL HISTORY     transanal excision of a villous adenoma   RADIOLOGY WITH ANESTHESIA N/A 11/21/2021   Procedure: RADIOLOGY WITH ANESTHESIA CT MICROWAVE ABLATION;  Surgeon: Suzette Battiest, MD;  Location: [facility];  Service: Radiology;  Laterality: N/A;   SKIN CANCER EXCISION      Allergies: Tape  Medications: Prior to Admission medications   Medication Sig Start Date End Date Taking? Authorizing Provider  amLODipine (NORVASC) 2.5 MG tablet Take 2.5 mg by mouth daily. 10/13/19   [provider]  clobetasol ointment (TEMOVATE) 4.26 % Apply 1 application  topically daily as needed (rash). 12/03/18   [provider]  doxazosin (CARDURA) 8 MG tablet Take 1 tablet (8 mg total) by mouth daily. 12/06/21   Simmons-Robinson, Makiera, MD  furosemide (LASIX) 20 MG tablet TAKE 1 TABLET BY MOUTH ONCE DAILY Patient taking differently: Take 20 mg by mouth daily as needed for fluid. 12/05/20   Jerrol Banana., MD  ibuprofen (ADVIL,MOTRIN) 800 MG tablet TAKE 1 TABLET BY MOUTH TWICE A DAY AS  NEEDED Patient not taking: Reported on 11/08/2021 11/29/15   Jerrol Banana., MD  lisinopril (ZESTRIL) 20 MG tablet Take 1 tablet (20 mg total) by mouth daily. 08/06/21   Jerrol Banana., MD  Multiple Vitamin tablet Take 1 tablet by mouth daily.  07/30/11   [provider]  sildenafil (REVATIO) 20 MG tablet Take 1-5 tablets by mouth daily. Patient taking differently: Take 20-100 mg by mouth daily as needed (ED). 07/11/21   Jerrol Banana., MD     Family History  Problem Relation Age of Onset   Stroke Mother    Cancer Father        melanoma   Diabetes Father    Hypertension Father    Heart attack Father    Hyperlipidemia Brother     Heart attack Brother    Diabetes Son    COPD Son    Obesity Son    Heart attack Son    Hepatitis C Son    Cirrhosis Son    Kidney failure Son    CAD Brother    Diverticulitis Daughter    Heart disease Daughter    Heart attack Daughter     Social History   Socioeconomic History   Marital status: Widowed    Spouse name: Sunday Spillers   Number of children: 4   Years of education: Not on file   Highest education level: Some college, no degree  Occupational History   Occupation: retired  Tobacco Use   Smoking status: Former    Packs/day: 1.00    Years: 40.00    Total pack years: 40.00    Types: Cigarettes    Quit date: 01/04/1994    Years since quitting: 27.9   Smokeless tobacco: Never  Vaping Use   Vaping Use: Never used  Substance and Sexual Activity   Alcohol use: No   Drug use: No   Sexual activity: Never  Other Topics Concern   Not on file  Social History Narrative   Not on file   Social Determinants of Health   Financial Resource Strain: Low Risk  (02/07/2021)   Overall Financial Resource Strain (CARDIA)    Difficulty of Paying Living Expenses: Not hard at all  Food Insecurity: No Food Insecurity (02/07/2021)   Hunger Vital Sign    Worried About Running Out of Food in the Last Year: Never true    [employer] in the Last Year: Never true  Transportation Needs: No Transportation Needs (02/07/2021)   PRAPARE - Hydrologist (Medical): No    Lack of Transportation (Non-Medical): No  Physical Activity: Insufficiently Active (02/07/2021)   Exercise Vital Sign    Days of Exercise per Week: 4 days    Minutes of Exercise per Session: 30 min  Stress: No Stress Concern Present (02/07/2021)   [place]    Feeling of Stress : Not at all  Social Connections: Moderately Isolated (02/07/2021)   Social Connection and Isolation Panel [NHANES]    Frequency of Communication with Friends  and Family: More than three times a week    Frequency of Social Gatherings with Friends and Family: Once a week    Attends Religious Services: Never    Marine scientist or Organizations: Yes    Attends Archivist Meetings: Never    Marital Status: Widowed     Vital Signs: There were no vitals taken for this  visit.  No physical examination was performed in lieu of virtual telephone clinic visit.   Imaging: MR 01/19/20   3.2 cm enhancing, exophytic mass about lateral upper pole of right kidney   US Renal 09/28/21   MR abdomen 12/24/21  Right renal mass s/p ablation, no evidence of residual enhancement.   Labs:  CBC: Recent Labs    07/11/21 1558 11/13/21 1015  WBC 6.3 5.8  HGB 14.3 14.2  HCT 41.0 42.4  PLT 235 214    COAGS: Recent Labs    11/21/21 1020  INR 1.1    BMP: Recent Labs    07/11/21 1558 11/13/21 1015  NA 141 142  K 4.2 4.3  CL 102 107  CO2 25 28  GLUCOSE 96 98  BUN 12 13  CALCIUM 8.7 8.6*  CREATININE 1.04 0.98  GFRNONAA  --  >60    LIVER FUNCTION TESTS: Recent Labs    07/11/21 1558  BILITOT 0.8  AST 20  ALT 18  ALKPHOS 99  PROT 6.6  ALBUMIN 4.0   Surgical Pathology: 12/24/21 FINAL MICROSCOPIC DIAGNOSIS:   A. KIDNEY, RIGHT, BIOPSY:  Oncocytic neoplasm.    Assessment and Plan: 81 year old male with history of right renal oncocytic neoplasm status post microwave ablation.  He is recovering well after microwave ablation on 11/21/21 without symptoms.    Plan for MRI abdomen in 3 months and clinic follow up shortly after.    Electronically Signed: Suzette Battiest 12/27/2021, 4:00 PM   I spent a total of 25 Minutes in virtual telephone clinical consultation, greater than 50% of which was counseling/coordinating care for right renal mass.

## 2021-12-28 ENCOUNTER — Ambulatory Visit
Admission: RE | Admit: 2021-12-28 | Discharge: 2021-12-28 | Disposition: A | Discharge status: Home / Self Care | Payer: PPO | Source: Ambulatory Visit | Attending: Interventional Radiology | Admitting: Interventional Radiology

## 2021-12-28 DIAGNOSIS — Z9889 Other specified postprocedural states: Secondary | ICD-10-CM | POA: Diagnosis not present

## 2021-12-28 DIAGNOSIS — C641 Malignant neoplasm of right kidney, except renal pelvis: Secondary | ICD-10-CM

## 2021-12-28 DIAGNOSIS — N2889 Other specified disorders of kidney and ureter: Secondary | ICD-10-CM | POA: Diagnosis not present

## 2021-12-28 HISTORY — PX: IR RADIOLOGIST EVAL & MGMT: IMG5224

## 2022-01-16 ENCOUNTER — Ambulatory Visit: Payer: PPO | Admitting: Family Medicine

## 2022-02-12 ENCOUNTER — Telehealth: Payer: Self-pay | Admitting: Family Medicine

## 2022-02-12 DIAGNOSIS — Z6841 Body Mass Index (BMI) 40.0 and over, adult: Secondary | ICD-10-CM | POA: Diagnosis not present

## 2022-02-12 DIAGNOSIS — E039 Hypothyroidism, unspecified: Secondary | ICD-10-CM | POA: Diagnosis not present

## 2022-02-12 DIAGNOSIS — C641 Malignant neoplasm of right kidney, except renal pelvis: Secondary | ICD-10-CM | POA: Diagnosis not present

## 2022-02-12 DIAGNOSIS — R7309 Other abnormal glucose: Secondary | ICD-10-CM | POA: Diagnosis not present

## 2022-02-12 DIAGNOSIS — R7989 Other specified abnormal findings of blood chemistry: Secondary | ICD-10-CM | POA: Diagnosis not present

## 2022-02-12 DIAGNOSIS — I10 Essential (primary) hypertension: Secondary | ICD-10-CM | POA: Diagnosis not present

## 2022-02-12 NOTE — Telephone Encounter (Signed)
Per patient he is seeing another PCP.

## 2022-03-05 DIAGNOSIS — C641 Malignant neoplasm of right kidney, except renal pelvis: Secondary | ICD-10-CM | POA: Diagnosis not present

## 2022-03-05 DIAGNOSIS — E039 Hypothyroidism, unspecified: Secondary | ICD-10-CM | POA: Diagnosis not present

## 2022-03-05 DIAGNOSIS — R7309 Other abnormal glucose: Secondary | ICD-10-CM | POA: Diagnosis not present

## 2022-03-22 ENCOUNTER — Other Ambulatory Visit: Payer: Self-pay | Admitting: Interventional Radiology

## 2022-03-22 DIAGNOSIS — C641 Malignant neoplasm of right kidney, except renal pelvis: Secondary | ICD-10-CM

## 2022-04-02 ENCOUNTER — Ambulatory Visit
Admission: RE | Admit: 2022-04-02 | Discharge: 2022-04-02 | Disposition: A | Discharge status: Home / Self Care | Payer: HMO | Source: Ambulatory Visit | Attending: Interventional Radiology | Admitting: Interventional Radiology

## 2022-04-02 DIAGNOSIS — C641 Malignant neoplasm of right kidney, except renal pelvis: Secondary | ICD-10-CM | POA: Diagnosis not present

## 2022-04-02 DIAGNOSIS — K7689 Other specified diseases of liver: Secondary | ICD-10-CM | POA: Diagnosis not present

## 2022-04-02 DIAGNOSIS — N281 Cyst of kidney, acquired: Secondary | ICD-10-CM | POA: Diagnosis not present

## 2022-04-02 MED ORDER — GADOBUTROL 1 MMOL/ML IV SOLN
10.0000 mL | Freq: Once | INTRAVENOUS | Status: AC | PRN
  Administered 2022-04-02: 10 mL via INTRAVENOUS

## 2022-04-12 ENCOUNTER — Ambulatory Visit
Admission: RE | Admit: 2022-04-12 | Discharge: 2022-04-12 | Disposition: A | Discharge status: Home / Self Care | Payer: HMO | Source: Ambulatory Visit | Attending: Interventional Radiology | Admitting: Interventional Radiology

## 2022-04-12 DIAGNOSIS — C641 Malignant neoplasm of right kidney, except renal pelvis: Secondary | ICD-10-CM

## 2022-04-12 DIAGNOSIS — N2889 Other specified disorders of kidney and ureter: Secondary | ICD-10-CM | POA: Diagnosis not present

## 2022-04-12 HISTORY — PX: IR RADIOLOGIST EVAL & MGMT: IMG5224

## 2022-04-12 NOTE — Progress Notes (Signed)
Reason for follow up: 4 months after right renal mass microwave ablation   Referring Physician(s): Stoioff,Scott C   History of Present Illness: Patrick Thornton is a 82 y.o. male with history of renal mass to presents via virtual telephone clinic visit at the gracious referral of Dr. Bernardo Heater for consideration of renal mass ablation.  Patrick Thornton was first diagnosed with a right renal mass in 2021 after undergoing a renal ultrasound for right flank pain.  This was further evaluated a couple months later with an MR which showed an enhancing exophytic upper pole mass measuring up to 3.2 cm.  The mass has been followed by ultrasound since then, and has grown to 3.7 cm.  Given recent increase size, he is motivated to pursue intervention.  He remains asymptomatic without any right flank or abdominal pain, no hematuria.  No recent fevers, chills, chest pain, shortness of breath, nausea, vomiting, or diarrhea.     The patient presents today via virtual telephone clinic visit for follow up after renal mass microwave ablation.   Pathology resulted as oncocytoma.    He is recovering very well.  He denies any back pain, abdominal pain, hematuria, fevers, chills, chest pain, or shortness of breath.  He underwent initial surveillance MRI on 04/02/22.   Past Medical History:  Diagnosis Date   Arthritis    Cancer of skin of back    Collar bone fracture    x 2   Colon polyp 2003   Dysrhythmia    occasionally skips a beat and has bradycardia(in the30's today)   Edema    legs/feet occas   Headache    History of hiatal hernia    Hypertension    Pre-diabetes    Renal mass, right    Sleep apnea    20 years ago and did not wear cpap long    Past Surgical History:  Procedure Laterality Date   BUNIONECTOMY WITH HAMMERTOE RECONSTRUCTION Left 2011   [place] N/A 01/09/2016   Procedure: CIRCUMCISION ADULT;  Surgeon: Hollice Espy, MD;  Location: [facility] OR[facility];  Service: Urology;   Laterality: N/A;   COLONOSCOPY  03/22/2008   Dr Bary Castilla   FOOT SURGERY Left    x 2 pins   FRACTURE SURGERY Left 1970   pins in left big toe and first toe   HERNIA REPAIR  1970   IR RADIOLOGIST EVAL & MGMT  10/18/2021   IR RADIOLOGIST EVAL & MGMT  12/28/2021   OTHER SURGICAL HISTORY     transanal excision of a villous adenoma   RADIOLOGY WITH ANESTHESIA N/A 11/21/2021   Procedure: RADIOLOGY WITH ANESTHESIA CT MICROWAVE ABLATION;  Surgeon: Suzette Battiest, MD;  Location: [facility];  Service: Radiology;  Laterality: N/A;   SKIN CANCER EXCISION      Allergies: Tape  Medications: Prior to Admission medications   Medication Sig Start Date End Date Taking? Authorizing Provider  amLODipine (NORVASC) 2.5 MG tablet Take 2.5 mg by mouth daily. 10/13/19   [provider]  clobetasol ointment (TEMOVATE) [id number] % Apply 1 application  topically daily as needed (rash). 12/03/18   [provider]  doxazosin (CARDURA) 8 MG tablet Take 1 tablet (8 mg total) by mouth daily. 12/06/21   Simmons-Robinson, Makiera, MD  furosemide (LASIX) 20 MG tablet TAKE 1 TABLET BY MOUTH ONCE DAILY Patient taking differently: Take 20 mg by mouth daily as needed for fluid. 12/05/20   Eulas Post, MD  ibuprofen (ADVIL,MOTRIN) 800  MG tablet TAKE 1 TABLET BY MOUTH TWICE A DAY AS NEEDED Patient not taking: Reported on 11/08/2021 11/29/15   Eulas Post, MD  lisinopril (ZESTRIL) 20 MG tablet Take 1 tablet (20 mg total) by mouth daily. 08/06/21   Eulas Post, MD  Multiple Vitamin tablet Take 1 tablet by mouth daily.  07/30/11   [provider]  sildenafil (REVATIO) 20 MG tablet Take 1-5 tablets by mouth daily. Patient taking differently: Take 20-100 mg by mouth daily as needed (ED). 07/11/21   Eulas Post, MD     Family History  Problem Relation Age of Onset   Stroke Mother    Cancer Father        melanoma   Diabetes Father    Hypertension Father    Heart attack Father     Hyperlipidemia Brother    Heart attack Brother    Diabetes Son    COPD Son    Obesity Son    Heart attack Son    Hepatitis C Son    Cirrhosis Son    Kidney failure Son    CAD Brother    Diverticulitis Daughter    Heart disease Daughter    Heart attack Daughter     Social History   Socioeconomic History   Marital status: Widowed    Spouse name: Sunday Spillers   Number of children: 4   Years of education: Not on file   Highest education level: Some college, no degree  Occupational History   Occupation: retired  Tobacco Use   Smoking status: Former    Packs/day: 1.00    Years: 40.00    Additional pack years: 0.00    Total pack years: 40.00    Types: Cigarettes    Quit date: 01/04/1994    Years since quitting: 28.2   Smokeless tobacco: Never  Vaping Use   Vaping Use: Never used  Substance and Sexual Activity   Alcohol use: No   Drug use: No   Sexual activity: Never  Other Topics Concern   Not on file  Social History Narrative   Not on file   Social Determinants of Health   Financial Resource Strain: Low Risk  (02/07/2021)   Overall Financial Resource Strain (CARDIA)    Difficulty of Paying Living Expenses: Not hard at all  Food Insecurity: No Food Insecurity (02/07/2021)   Hunger Vital Sign    Worried About Running Out of Food in the Last Year: Never true    [place] in the Last Year: Never true  Transportation Needs: No Transportation Needs (02/07/2021)   PRAPARE - Hydrologist (Medical): No    Lack of Transportation (Non-Medical): No  Physical Activity: Insufficiently Active (02/07/2021)   Exercise Vital Sign    Days of Exercise per Week: 4 days    Minutes of Exercise per Session: 30 min  Stress: No Stress Concern Present (02/07/2021)   [place]    Feeling of Stress : Not at all  Social Connections: Moderately Isolated (02/07/2021)   Social Connection and Isolation  Panel [NHANES]    Frequency of Communication with Friends and Family: More than three times a week    Frequency of Social Gatherings with Friends and Family: Once a week    Attends Religious Services: Never    Marine scientist or Organizations: Yes    Attends Archivist Meetings: Never    Marital  Status: Widowed     Vital Signs: There were no vitals taken for this visit.  No physical examination was performed in lieu of virtual telephone clinic visit.   Imaging: MR 01/19/20   3.2 cm enhancing, exophytic mass about lateral upper pole of right kidney   US Renal 09/28/21    MR abdomen 12/24/21  Right renal mass s/p ablation, no evidence of residual enhancement.  MR abdomen 04/02/22  Right renal mass s/p ablation, no evidence of residual enhancement.  Labs:  CBC: Recent Labs    07/11/21 1558 11/13/21 1015  WBC 6.3 5.8  HGB 14.3 14.2  HCT 41.0 42.4  PLT 235 214    COAGS: Recent Labs    11/21/21 1020  INR 1.1    BMP: Recent Labs    07/11/21 1558 11/13/21 1015  NA 141 142  K 4.2 4.3  CL 102 107  CO2 25 28  GLUCOSE 96 98  BUN 12 13  CALCIUM 8.7 8.6*  CREATININE 1.04 0.98  GFRNONAA  --  >60    LIVER FUNCTION TESTS: Recent Labs    07/11/21 1558  BILITOT 0.8  AST 20  ALT 18  ALKPHOS 99  PROT 6.6  ALBUMIN 4.0    Surgical Pathology: 12/24/21 FINAL MICROSCOPIC DIAGNOSIS:   A. KIDNEY, RIGHT, BIOPSY:  Oncocytic neoplasm.     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  He is continuing to recover well after microwave ablation on 11/21/21 without symptoms.     Plan for MRI abdomen in 6 months and clinic follow up shortly after.   Electronically Signed: Suzette Battiest 04/12/2022, 8:06 AM   I spent a total of 25 Minutes in virtual telephone clinical consultation, greater than 50% of which was counseling/coordinating care for renal oncocytoma.

## 2022-04-18 DIAGNOSIS — Z23 Encounter for immunization: Secondary | ICD-10-CM | POA: Diagnosis not present

## 2022-04-18 DIAGNOSIS — E782 Mixed hyperlipidemia: Secondary | ICD-10-CM | POA: Diagnosis not present

## 2022-04-18 DIAGNOSIS — I10 Essential (primary) hypertension: Secondary | ICD-10-CM | POA: Diagnosis not present

## 2022-04-18 DIAGNOSIS — I071 Rheumatic tricuspid insufficiency: Secondary | ICD-10-CM | POA: Diagnosis not present

## 2022-04-18 DIAGNOSIS — I493 Ventricular premature depolarization: Secondary | ICD-10-CM | POA: Diagnosis not present

## 2022-04-18 DIAGNOSIS — I340 Nonrheumatic mitral (valve) insufficiency: Secondary | ICD-10-CM | POA: Diagnosis not present

## 2022-05-02 DIAGNOSIS — I340 Nonrheumatic mitral (valve) insufficiency: Secondary | ICD-10-CM | POA: Diagnosis not present

## 2022-06-20 DIAGNOSIS — Z85828 Personal history of other malignant neoplasm of skin: Secondary | ICD-10-CM | POA: Diagnosis not present

## 2022-06-20 DIAGNOSIS — D2261 Melanocytic nevi of right upper limb, including shoulder: Secondary | ICD-10-CM | POA: Diagnosis not present

## 2022-06-20 DIAGNOSIS — L900 Lichen sclerosus et atrophicus: Secondary | ICD-10-CM | POA: Diagnosis not present

## 2022-06-20 DIAGNOSIS — X32XXXA Exposure to sunlight, initial encounter: Secondary | ICD-10-CM | POA: Diagnosis not present

## 2022-06-20 DIAGNOSIS — D2271 Melanocytic nevi of right lower limb, including hip: Secondary | ICD-10-CM | POA: Diagnosis not present

## 2022-06-20 DIAGNOSIS — D2272 Melanocytic nevi of left lower limb, including hip: Secondary | ICD-10-CM | POA: Diagnosis not present

## 2022-06-20 DIAGNOSIS — Z08 Encounter for follow-up examination after completed treatment for malignant neoplasm: Secondary | ICD-10-CM | POA: Diagnosis not present

## 2022-06-20 DIAGNOSIS — D2262 Melanocytic nevi of left upper limb, including shoulder: Secondary | ICD-10-CM | POA: Diagnosis not present

## 2022-06-20 DIAGNOSIS — L570 Actinic keratosis: Secondary | ICD-10-CM | POA: Diagnosis not present

## 2022-06-20 DIAGNOSIS — D225 Melanocytic nevi of trunk: Secondary | ICD-10-CM | POA: Diagnosis not present

## 2022-07-05 DIAGNOSIS — H2511 Age-related nuclear cataract, right eye: Secondary | ICD-10-CM | POA: Diagnosis not present

## 2022-07-05 DIAGNOSIS — H2512 Age-related nuclear cataract, left eye: Secondary | ICD-10-CM | POA: Diagnosis not present

## 2022-07-05 DIAGNOSIS — H43813 Vitreous degeneration, bilateral: Secondary | ICD-10-CM | POA: Diagnosis not present

## 2022-08-20 DIAGNOSIS — R519 Headache, unspecified: Secondary | ICD-10-CM | POA: Diagnosis not present

## 2022-09-27 DIAGNOSIS — Z0000 Encounter for general adult medical examination without abnormal findings: Secondary | ICD-10-CM | POA: Diagnosis not present

## 2022-09-27 DIAGNOSIS — R519 Headache, unspecified: Secondary | ICD-10-CM | POA: Diagnosis not present

## 2022-09-27 DIAGNOSIS — R7309 Other abnormal glucose: Secondary | ICD-10-CM | POA: Diagnosis not present

## 2022-09-27 DIAGNOSIS — G8929 Other chronic pain: Secondary | ICD-10-CM | POA: Diagnosis not present

## 2022-09-27 DIAGNOSIS — I10 Essential (primary) hypertension: Secondary | ICD-10-CM | POA: Diagnosis not present

## 2022-09-27 DIAGNOSIS — R946 Abnormal results of thyroid function studies: Secondary | ICD-10-CM | POA: Diagnosis not present

## 2022-09-27 DIAGNOSIS — Z1331 Encounter for screening for depression: Secondary | ICD-10-CM | POA: Diagnosis not present

## 2022-09-27 DIAGNOSIS — C641 Malignant neoplasm of right kidney, except renal pelvis: Secondary | ICD-10-CM | POA: Diagnosis not present

## 2022-10-07 ENCOUNTER — Other Ambulatory Visit: Payer: Self-pay | Admitting: Interventional Radiology

## 2022-10-07 DIAGNOSIS — D3001 Benign neoplasm of right kidney: Secondary | ICD-10-CM

## 2022-10-17 DIAGNOSIS — I119 Hypertensive heart disease without heart failure: Secondary | ICD-10-CM | POA: Diagnosis not present

## 2022-10-17 DIAGNOSIS — E782 Mixed hyperlipidemia: Secondary | ICD-10-CM | POA: Diagnosis not present

## 2022-10-17 DIAGNOSIS — I272 Pulmonary hypertension, unspecified: Secondary | ICD-10-CM | POA: Diagnosis not present

## 2022-10-17 DIAGNOSIS — I340 Nonrheumatic mitral (valve) insufficiency: Secondary | ICD-10-CM | POA: Diagnosis not present

## 2022-10-17 DIAGNOSIS — I493 Ventricular premature depolarization: Secondary | ICD-10-CM | POA: Diagnosis not present

## 2022-10-17 DIAGNOSIS — I071 Rheumatic tricuspid insufficiency: Secondary | ICD-10-CM | POA: Diagnosis not present

## 2022-10-17 DIAGNOSIS — G4733 Obstructive sleep apnea (adult) (pediatric): Secondary | ICD-10-CM | POA: Diagnosis not present

## 2022-10-17 DIAGNOSIS — I10 Essential (primary) hypertension: Secondary | ICD-10-CM | POA: Diagnosis not present

## 2022-10-19 ENCOUNTER — Ambulatory Visit
Admission: RE | Admit: 2022-10-19 | Discharge: 2022-10-19 | Disposition: A | Discharge status: Home / Self Care | Payer: HMO | Source: Ambulatory Visit | Attending: Interventional Radiology | Admitting: Interventional Radiology

## 2022-10-19 DIAGNOSIS — D3001 Benign neoplasm of right kidney: Secondary | ICD-10-CM | POA: Diagnosis not present

## 2022-10-19 DIAGNOSIS — N261 Atrophy of kidney (terminal): Secondary | ICD-10-CM | POA: Diagnosis not present

## 2022-10-19 MED ORDER — GADOBUTROL 1 MMOL/ML IV SOLN
10.0000 mL | Freq: Once | INTRAVENOUS | Status: AC | PRN
  Administered 2022-10-19: 10 mL via INTRAVENOUS

## 2022-10-23 ENCOUNTER — Ambulatory Visit
Admission: RE | Admit: 2022-10-23 | Discharge: 2022-10-23 | Disposition: A | Discharge status: Home / Self Care | Payer: HMO | Source: Ambulatory Visit | Attending: Interventional Radiology

## 2022-10-23 DIAGNOSIS — D4101 Neoplasm of uncertain behavior of right kidney: Secondary | ICD-10-CM | POA: Diagnosis not present

## 2022-10-23 DIAGNOSIS — D3001 Benign neoplasm of right kidney: Secondary | ICD-10-CM

## 2022-10-23 HISTORY — PX: IR RADIOLOGIST EVAL & MGMT: IMG5224

## 2022-10-23 NOTE — Progress Notes (Signed)
Reason for follow up: 4 months after right renal mass microwave ablation   Referring Physician(s): Thornton,Patrick C   History of Present Illness: Patrick Thornton is a 82 y.o. male with history of renal mass to presents via virtual telephone clinic visit at the gracious referral of Dr. Lonna Thornton for consideration of renal mass ablation.  Patrick Thornton was first diagnosed with a right renal mass in 2021 after undergoing a renal ultrasound for right flank pain.  This was further evaluated a couple months later with an MR which showed an enhancing exophytic upper pole mass measuring up to 3.2 cm.  The mass has been followed by ultrasound since then, and has grown to 3.7 cm.  Given recent increase size, he is motivated to pursue intervention.  He remains asymptomatic without any right flank or abdominal pain, no hematuria.  No recent fevers, chills, chest pain, shortness of breath, nausea, vomiting, or diarrhea.     The patient presents today via virtual telephone clinic visit for follow up after renal mass microwave ablation on 11/21/21.   Pathology resulted as oncocytoma.     He is recovering very well.  He denies any back pain, abdominal pain, hematuria, fevers, chills, chest pain, or shortness of breath.  He underwent surveillance MRI on 10/19/22.  He reports recurring headaches after his MRI, which also happened back in March.  He is concerned he may be having a reaction to the contrast.    Past Medical History:  Diagnosis Date   Arthritis    Cancer of skin of back    Collar bone fracture    x 2   Colon polyp 2003   Dysrhythmia    occasionally skips a beat and has bradycardia(in the30's today)   Edema    legs/feet occas   Headache    History of hiatal hernia    Hypertension    Pre-diabetes    Renal mass, right    Sleep apnea    20 years ago and did not wear cpap long    Past Surgical History:  Procedure Laterality Date   BUNIONECTOMY WITH HAMMERTOE RECONSTRUCTION Left 2011    CHOLECYSTECTOMY  1970   CIRCUMCISION N/A 01/09/2016   Procedure: CIRCUMCISION ADULT;  Surgeon: Patrick Scotland, MD;  Location: [facility] ORS;  Service: Urology;  Laterality: N/A;   COLONOSCOPY  03/22/2008   Dr Patrick Thornton   FOOT SURGERY Left    x 2 pins   FRACTURE SURGERY Left 1970   pins in left big toe and first toe   HERNIA REPAIR  1970   IR RADIOLOGIST EVAL & MGMT  10/18/2021   IR RADIOLOGIST EVAL & MGMT  12/28/2021   IR RADIOLOGIST EVAL & MGMT  04/12/2022   OTHER SURGICAL HISTORY     transanal excision of a villous adenoma   RADIOLOGY WITH ANESTHESIA N/A 11/21/2021   Procedure: RADIOLOGY WITH ANESTHESIA CT MICROWAVE ABLATION;  Surgeon: Patrick Dallas, MD;  Location: [facility];  Service: Radiology;  Laterality: N/A;   SKIN CANCER EXCISION      Allergies: Tape  Medications: Prior to Admission medications   Medication Sig Start Date End Date Taking? Authorizing Provider  amLODipine (NORVASC) 2.5 MG tablet Take 2.5 mg by mouth daily. 10/13/19   [provider]  clobetasol ointment (TEMOVATE) 0.05 % Apply 1 application  topically daily as needed (rash). 12/03/18   [provider]  doxazosin (CARDURA) 8 MG tablet Take 1 tablet (8 mg total) by mouth daily. 12/06/21   Thornton,  Makiera, MD  furosemide (LASIX) 20 MG tablet TAKE 1 TABLET BY MOUTH ONCE DAILY Patient taking differently: Take 20 mg by mouth daily as needed for fluid. 12/05/20   Patrick Clos, MD  ibuprofen (ADVIL,MOTRIN) 800 MG tablet TAKE 1 TABLET BY MOUTH TWICE A DAY AS NEEDED Patient not taking: Reported on 11/08/2021 11/29/15   Patrick Clos, MD  lisinopril (ZESTRIL) 20 MG tablet Take 1 tablet (20 mg total) by mouth daily. 08/06/21   Patrick Clos, MD  Multiple Vitamin tablet Take 1 tablet by mouth daily.  07/30/11   [provider]  sildenafil (REVATIO) 20 MG tablet Take 1-5 tablets by mouth daily. Patient taking differently: Take 20-100 mg by mouth daily as needed (ED). 07/11/21    Patrick Clos, MD     Family History  Problem Relation Age of Onset   Stroke Mother    Cancer Father        melanoma   Diabetes Father    Hypertension Father    Heart attack Father    Hyperlipidemia Brother    Heart attack Brother    Diabetes Son    COPD Son    Obesity Son    Heart attack Son    Hepatitis C Son    Cirrhosis Son    Kidney failure Son    CAD Brother    Diverticulitis Daughter    Heart disease Daughter    Heart attack Daughter     Social History   Socioeconomic History   Marital status: Widowed    Spouse name: Patrick Thornton   Number of children: 4   Years of education: Not on file   Highest education level: Some college, no degree  Occupational History   Occupation: retired  Tobacco Use   Smoking status: Former    Current packs/day: 0.00    Average packs/day: 1 pack/day for 40.0 years (40.0 ttl pk-yrs)    Types: Cigarettes    Start date: 01/04/1954    Quit date: 01/04/1994    Years since quitting: 28.8   Smokeless tobacco: Never  Vaping Use   Vaping status: Never Used  Substance and Sexual Activity   Alcohol use: No   Drug use: No   Sexual activity: Never  Other Topics Concern   Not on file  Social History Narrative   Not on file   Social Determinants of Health   Financial Resource Strain: Low Risk  (09/27/2022)   Received from [facility]   Overall Financial Resource Strain (CARDIA)    Difficulty of Paying Living Expenses: Not very hard  Food Insecurity: No Food Insecurity (09/27/2022)   Received from [facility]   Hunger Vital Sign    Worried About Running Out of Food in the Last Year: Never true    Ran Out of Food in the Last Year: Never true  Transportation Needs: No Transportation Needs (09/27/2022)   [facility] from [facility] - Transportation    In the past 12 months, has lack of transportation kept you from medical appointments or from getting medications?: No    Lack of  Transportation (Non-Medical): No  Physical Activity: Insufficiently Active (02/07/2021)   Exercise Vital Sign    Days of Exercise per Week: 4 days    Minutes of Exercise per Session: 30 min  Stress: No Stress Concern Present (02/07/2021)   [employer] of Occupational Health - Occupational Stress Questionnaire    Feeling of Stress :  Not at all  Social Connections: Moderately Isolated (02/07/2021)   Social Connection and Isolation Panel [NHANES]    Frequency of Communication with Friends and Family: More than three times a week    Frequency of Social Gatherings with Friends and Family: Once a week    Attends Religious Services: Never    Database administrator or Organizations: Yes    Attends Banker Meetings: Never    Marital Status: Widowed     Vital Signs: There were no vitals taken for this visit.  Physical Exam  Imaging: MR 01/19/20   3.2 cm enhancing, exophytic mass about lateral upper pole of right kidney   US Renal 09/28/21    MR abdomen 12/24/21  Right renal mass s/p ablation, no evidence of residual enhancement.   MR abdomen 04/02/22  Right renal mass s/p ablation, no evidence of residual enhancement.  MR abdomen 10/19/22  Right renal mass s/p ablation, no evidence of residual enhancement.   Labs:  CBC: Recent Labs    11/13/21 1015  WBC 5.8  HGB 14.2  HCT 42.4  PLT 214    COAGS: Recent Labs    11/21/21 1020  INR 1.1    BMP: Recent Labs    11/13/21 1015  NA 142  K 4.3  CL 107  CO2 28  GLUCOSE 98  BUN 13  CALCIUM 8.6*  CREATININE 0.98  GFRNONAA >60    LIVER FUNCTION TESTS: No results for input(s): "BILITOT", "AST", "ALT", "ALKPHOS", "PROT", "ALBUMIN" in the last 8760 hours.  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  He is continuing to recover well after microwave ablation on 11/21/21 without symptoms.  Given headaches after gadolinium  administration, we will convert to surveillance with CT.   Plan for CTA abdomen in 1 year and clinic follow up shortly after.   Electronically Signed: Bennie Thornton 10/23/2022, 8:12 AM   I spent a total of 25 Minutes in virtual telephone clinical consultation, greater than 50% of which was counseling/coordinating care for right renal oncocytic neoplasm.

## 2022-11-14 IMAGING — CR DG LUMBAR SPINE COMPLETE 4+V
1 series · 5 of 5 positions shown · non-contrast
Comparison: None.

CLINICAL DATA: Left low back pain for 4 months.

EXAM:
LUMBAR SPINE - COMPLETE 4+ VIEW

[Series 1: dg lumbar spine complete 4 +v · 0.14mm/px · 5 of 5 slices shown]
[im 1/5]
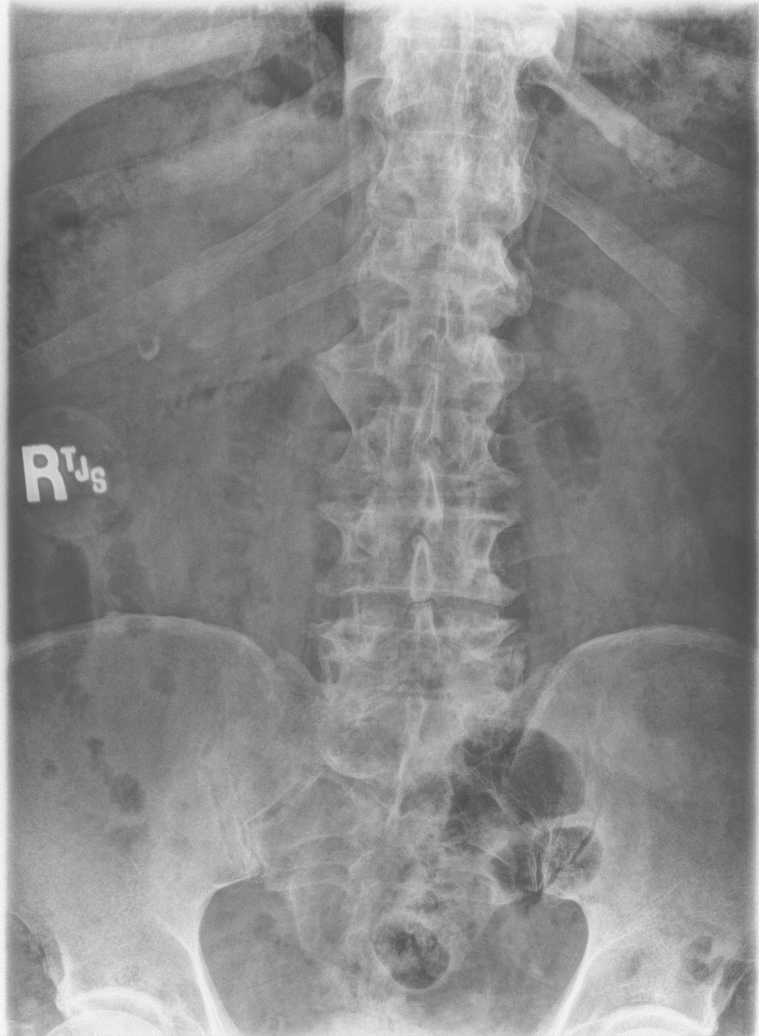
[im 2/5]
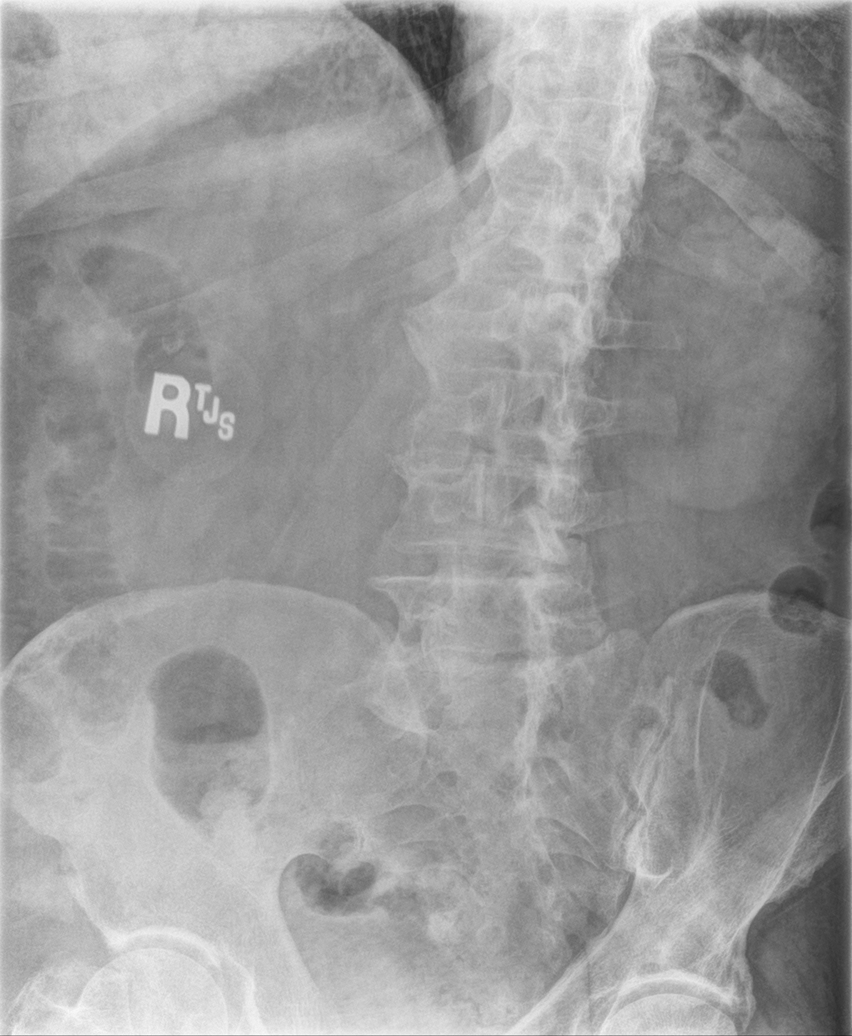
[im 3/5]
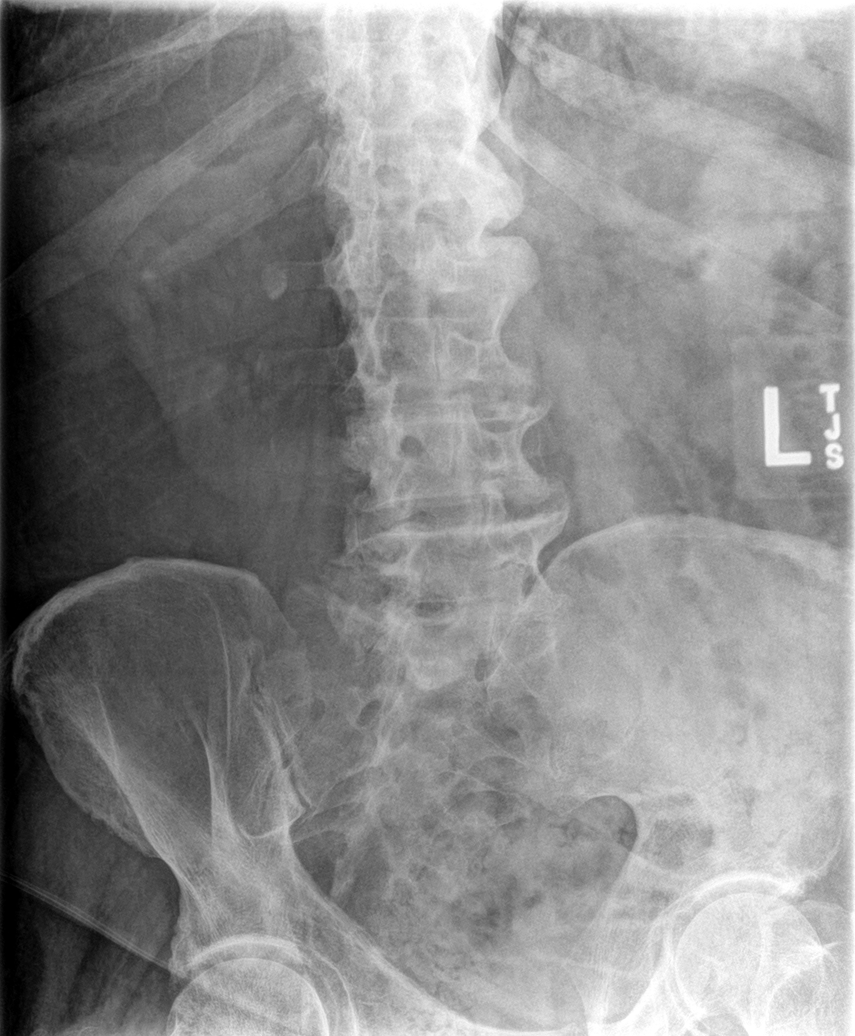
[im 4/5]
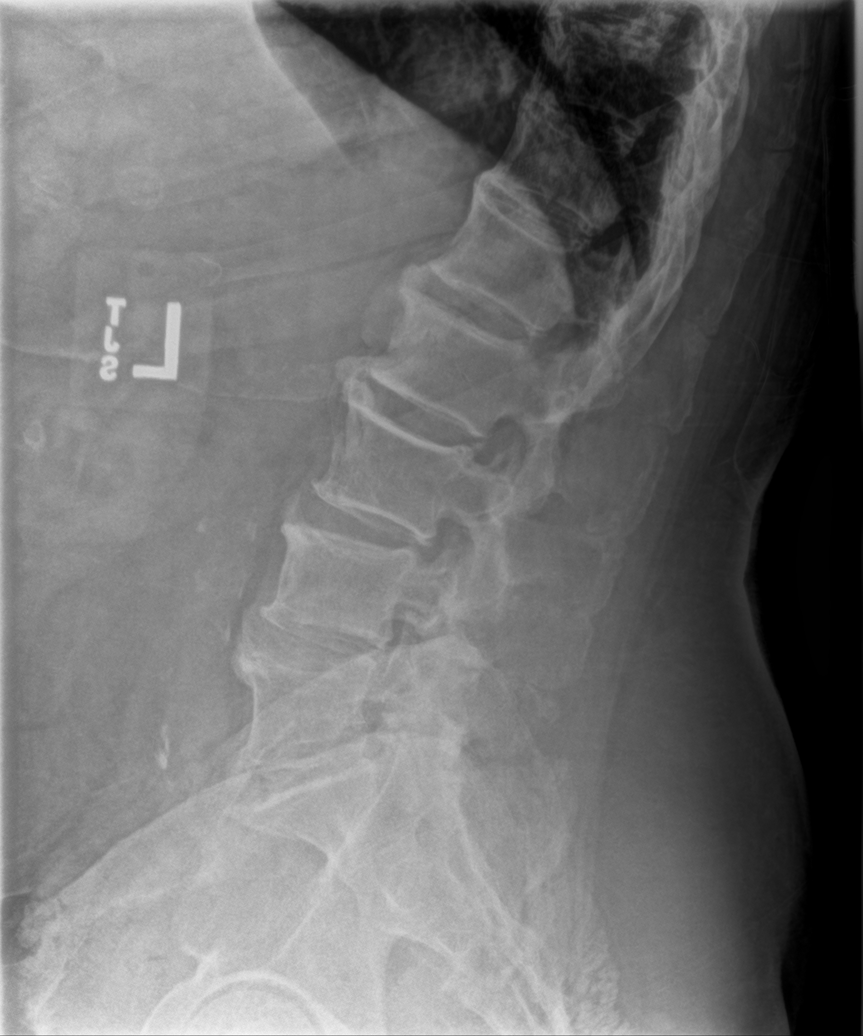
[im 5/5]
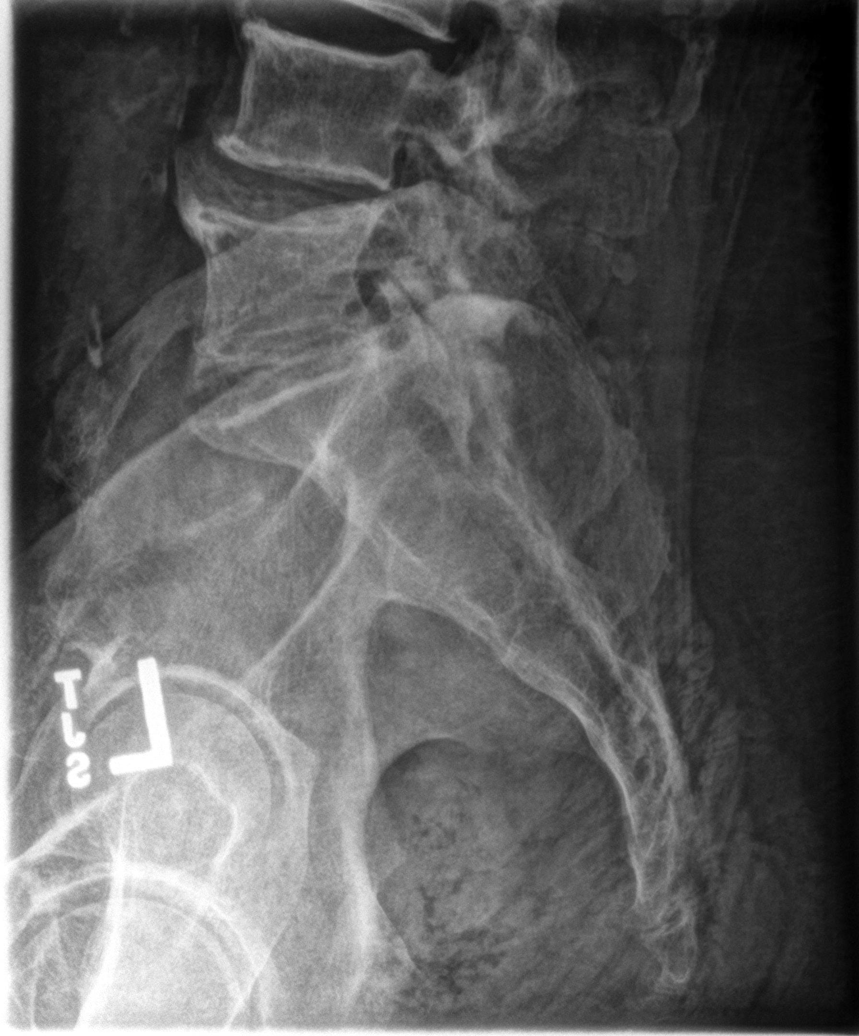

[5 of 5 positions shown; findings below may reference images not displayed]

FINDINGS: The lowest lumbar type vertebra is designated L5. With this
numbering, there are small ribs at L1. Vertebral alignment is
normal. There is moderate diffuse vertebral osteophytosis throughout
the lumbar spine as well as in the included lower thoracic spine
with largely preserved disc space heights. Mild-to-moderate lower
lumbar facet arthrosis is noted. No fracture is identified.
IMPRESSION: Mild-to-moderate lumbar spondylosis and facet arthrosis without
evidence of acute osseous abnormality.

## 2022-11-18 IMAGING — US US ABDOMEN COMPLETE
1 series · 13 of 25 positions shown · non-contrast
Comparison: None.

CLINICAL DATA: 79-year-old with right flank pain. History of
cholecystectomy.

EXAM:
ABDOMEN ULTRASOUND COMPLETE

[Series 1: us abdomen complete · 13 of 110 slices shown]
[im 1/110]
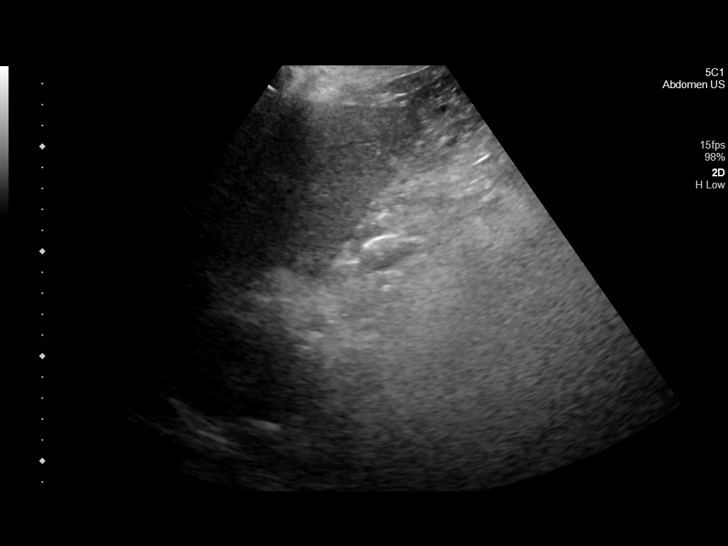
[im 10/110]
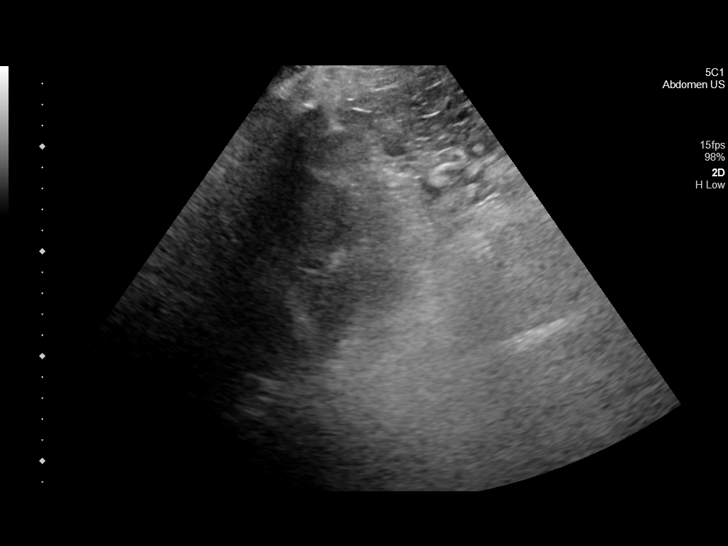
[im 19/110]
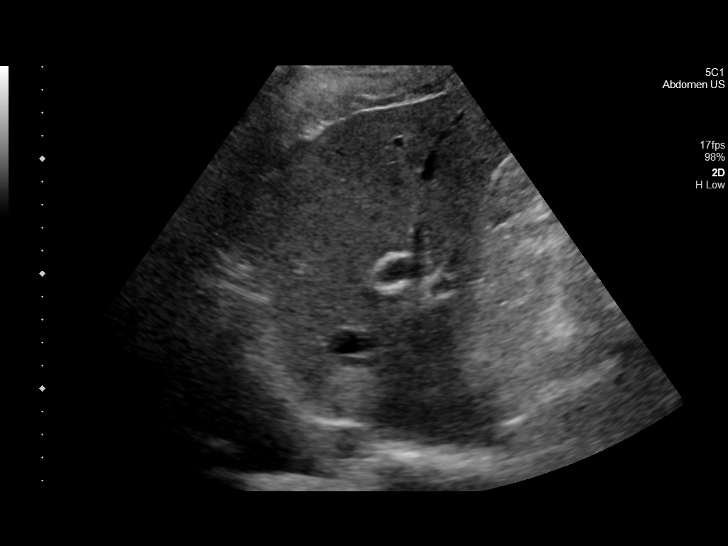
[im 28/110]
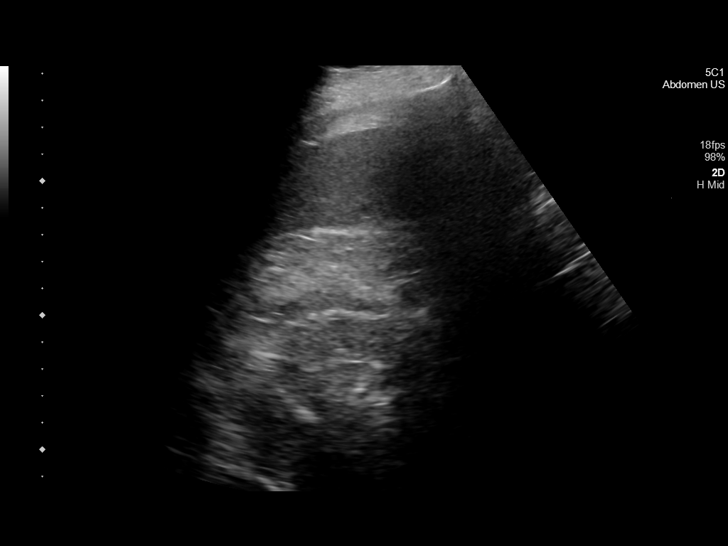
[im 37/110]
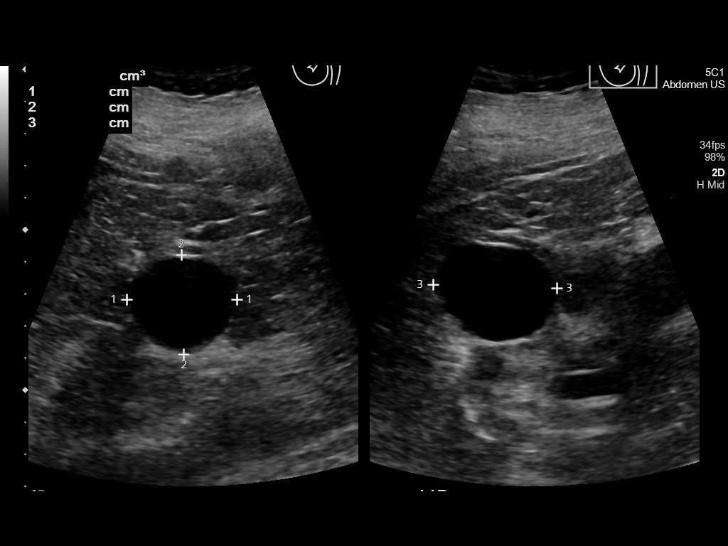
[im 46/110]
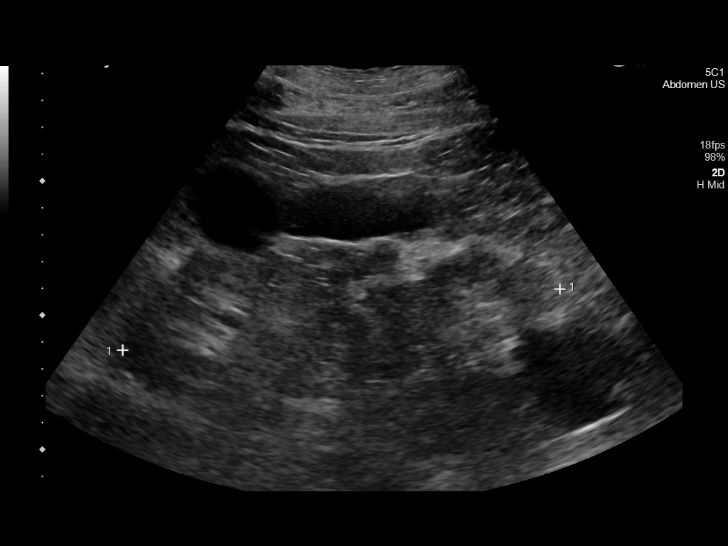
[im 55/110]
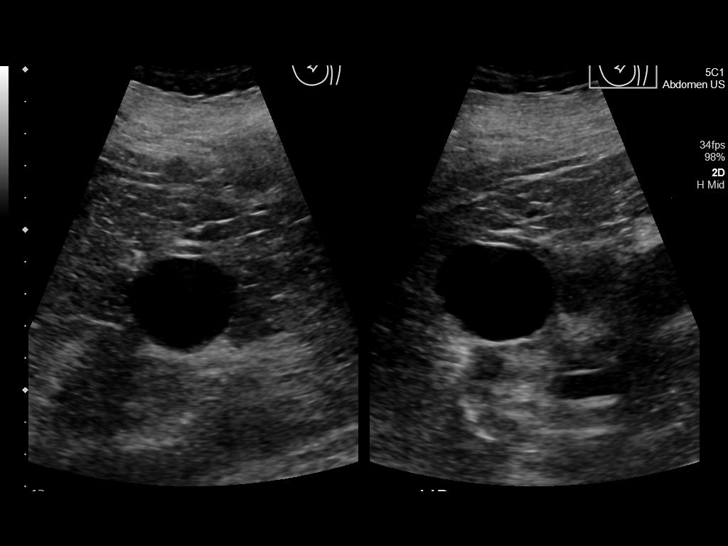
[im 64/110]
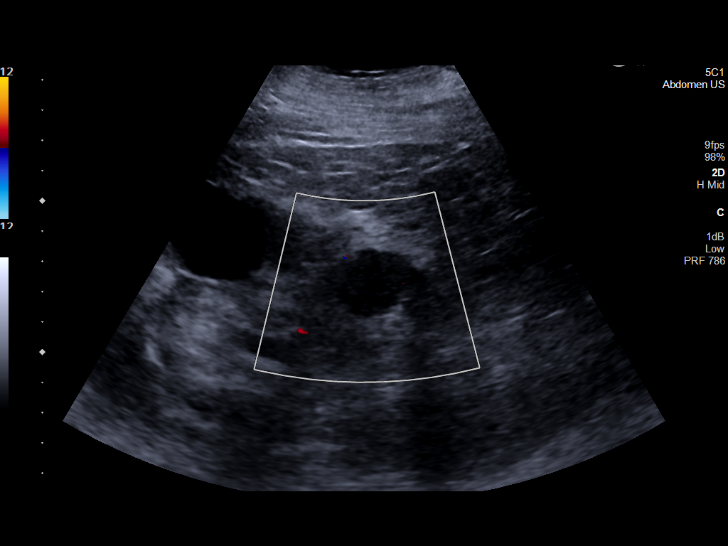
[im 73/110]
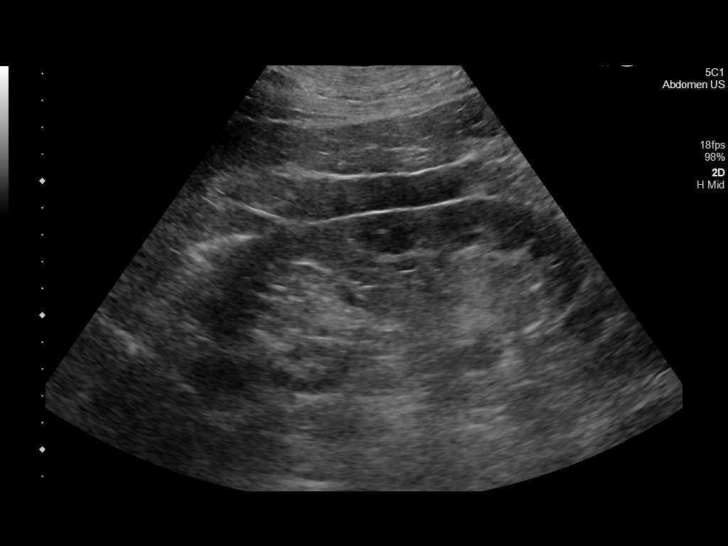
[im 82/110]
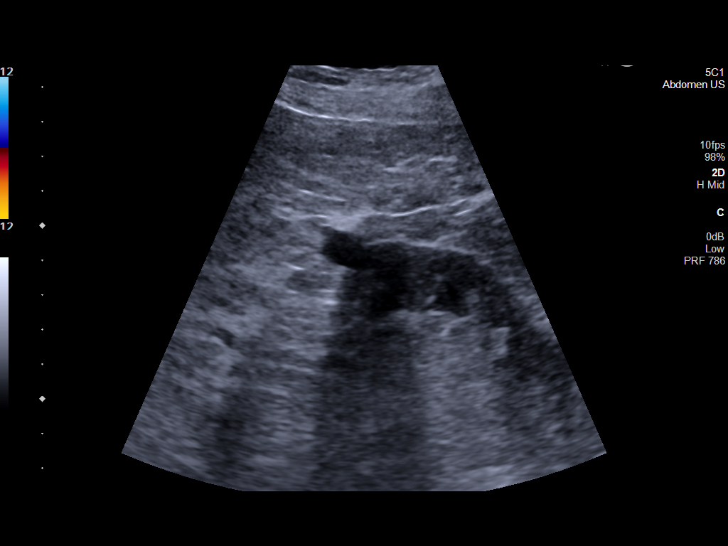
[im 91/110]
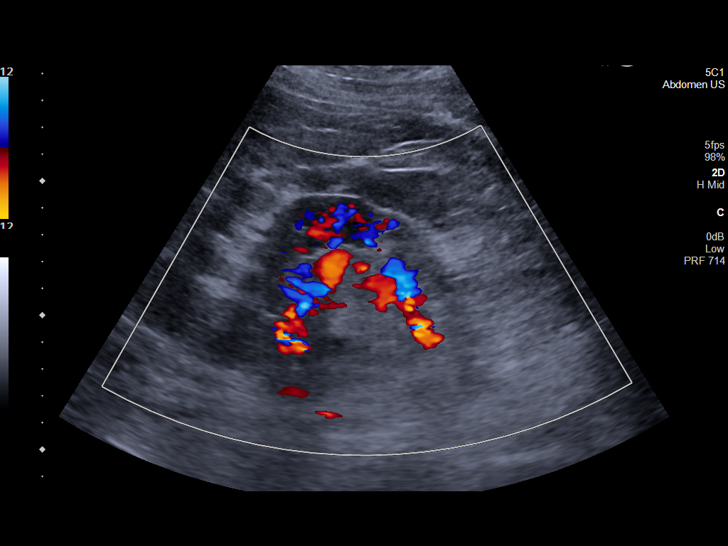
[im 100/110]
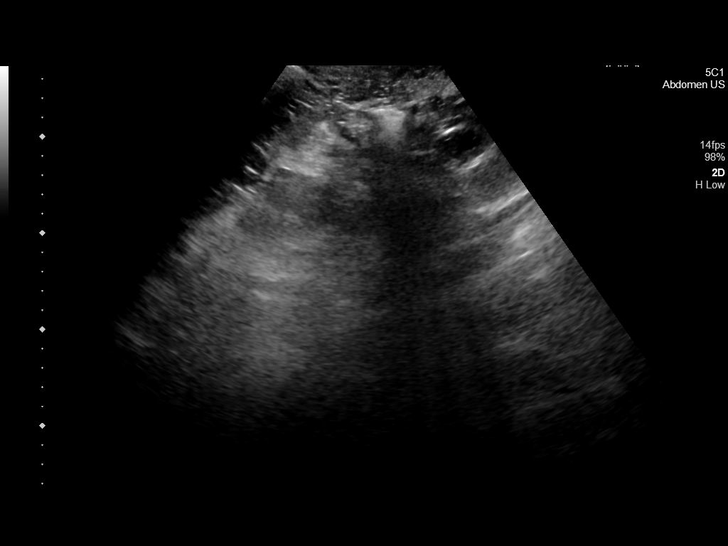
[im 110/110]
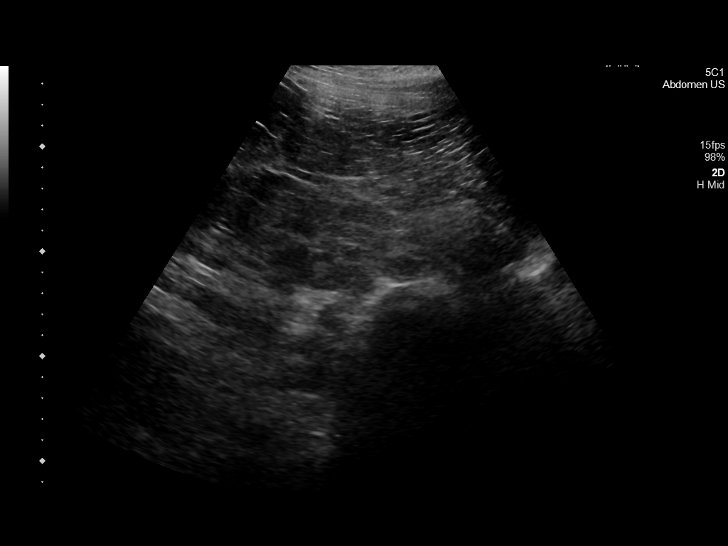

[13 of 25 positions shown; findings below may reference images not displayed]

FINDINGS: Gallbladder: Surgically removed

Common bile duct: Diameter: 0.4 cm

Liver: Liver is mildly heterogeneous and has a slightly nodular
contour. Lobulated cystic structure in the right hepatic lobe
measures up to 2.9 cm and compatible with a cyst. Portal vein is
patent on color Doppler imaging with normal direction of blood flow
towards the liver.

IVC: No abnormality visualized.

Pancreas: Limited evaluation.

Spleen: Spleen is slightly large for size. Spleen measures 13.4 x
4.7 x 14.2 cm. Splenic volume is 466 mL.

Right Kidney: Length: 16.4 cm. Right kidney is poorly characterized
and there is a heterogeneous hypoechoic structure involving the
right kidney upper pole that measures 3.1 x 2.6 x 3.2 cm. This is
not compatible with a simple cyst. There is a simple cyst involving
the right kidney upper pole that is anechoic and measures up to
cm. Right kidney may be very echogenic. Evidence for another simple
cystic structure in the right kidney lower pole region. No
hydronephrosis.

Left Kidney: Length: 14.2 cm. Slightly increased echogenicity. No
hydronephrosis. Small hypoechoic structure measures up to 1.7 cm and
compatible with a small cyst. There is an exophytic anechoic cyst in
left kidney that measures up to 3.7 cm.

Abdominal aorta: Proximal abdominal aorta is slightly enlarged
measuring up to 3.5 cm.

Other findings: None.
IMPRESSION: 1. Indeterminate and suspicious right renal lesion measuring up to
3.2 cm. Additional bilateral renal cysts. Recommend further
characterization of this indeterminate renal lesion with an MRI,
with and without contrast.
2. Both kidneys demonstrate increased echogenicity without
hydronephrosis. Increased echogenicity could be related to chronic
medical renal disease but nonspecific.
3. Liver is slightly heterogeneous and nodular. Cirrhosis cannot be
excluded. In addition, the spleen is slightly enlarged. Liver could
also be further characterized with MRI. Evidence for a lobulated
hepatic cyst.
4. Cholecystectomy.
5. Proximal abdominal aorta is mildly aneurysmal measuring up to
cm. Recommend follow-up ultrasound every 2 years. This
recommendation follows ACR consensus guidelines: White Paper of the
ACR Incidental Findings Committee II on Vascular Findings. [HOSPITAL] 0178; [DATE].

These results will be called to the ordering clinician or
representative by the Radiologist Assistant, and communication
documented in the PACS or [REDACTED].

## 2023-01-13 DIAGNOSIS — R519 Headache, unspecified: Secondary | ICD-10-CM | POA: Diagnosis not present

## 2023-01-13 DIAGNOSIS — C641 Malignant neoplasm of right kidney, except renal pelvis: Secondary | ICD-10-CM | POA: Diagnosis not present

## 2023-01-13 DIAGNOSIS — I10 Essential (primary) hypertension: Secondary | ICD-10-CM | POA: Diagnosis not present

## 2023-01-13 DIAGNOSIS — G8929 Other chronic pain: Secondary | ICD-10-CM | POA: Diagnosis not present

## 2023-03-24 DIAGNOSIS — D225 Melanocytic nevi of trunk: Secondary | ICD-10-CM | POA: Diagnosis not present

## 2023-03-24 DIAGNOSIS — Z85828 Personal history of other malignant neoplasm of skin: Secondary | ICD-10-CM | POA: Diagnosis not present

## 2023-03-24 DIAGNOSIS — D2272 Melanocytic nevi of left lower limb, including hip: Secondary | ICD-10-CM | POA: Diagnosis not present

## 2023-03-24 DIAGNOSIS — L570 Actinic keratosis: Secondary | ICD-10-CM | POA: Diagnosis not present

## 2023-03-24 DIAGNOSIS — D2261 Melanocytic nevi of right upper limb, including shoulder: Secondary | ICD-10-CM | POA: Diagnosis not present

## 2023-03-24 DIAGNOSIS — D2262 Melanocytic nevi of left upper limb, including shoulder: Secondary | ICD-10-CM | POA: Diagnosis not present

## 2023-04-01 DIAGNOSIS — C641 Malignant neoplasm of right kidney, except renal pelvis: Secondary | ICD-10-CM | POA: Diagnosis not present

## 2023-04-01 DIAGNOSIS — R946 Abnormal results of thyroid function studies: Secondary | ICD-10-CM | POA: Diagnosis not present

## 2023-04-01 DIAGNOSIS — N529 Male erectile dysfunction, unspecified: Secondary | ICD-10-CM | POA: Diagnosis not present

## 2023-04-01 DIAGNOSIS — I10 Essential (primary) hypertension: Secondary | ICD-10-CM | POA: Diagnosis not present

## 2023-04-01 DIAGNOSIS — R7309 Other abnormal glucose: Secondary | ICD-10-CM | POA: Diagnosis not present

## 2023-04-01 DIAGNOSIS — R519 Headache, unspecified: Secondary | ICD-10-CM | POA: Diagnosis not present

## 2023-04-01 DIAGNOSIS — Z1331 Encounter for screening for depression: Secondary | ICD-10-CM | POA: Diagnosis not present

## 2023-04-01 DIAGNOSIS — Z0000 Encounter for general adult medical examination without abnormal findings: Secondary | ICD-10-CM | POA: Diagnosis not present

## 2023-04-17 DIAGNOSIS — I493 Ventricular premature depolarization: Secondary | ICD-10-CM | POA: Diagnosis not present

## 2023-04-17 DIAGNOSIS — I119 Hypertensive heart disease without heart failure: Secondary | ICD-10-CM | POA: Diagnosis not present

## 2023-04-17 DIAGNOSIS — I10 Essential (primary) hypertension: Secondary | ICD-10-CM | POA: Diagnosis not present

## 2023-04-17 DIAGNOSIS — I071 Rheumatic tricuspid insufficiency: Secondary | ICD-10-CM | POA: Diagnosis not present

## 2023-04-17 DIAGNOSIS — E782 Mixed hyperlipidemia: Secondary | ICD-10-CM | POA: Diagnosis not present

## 2023-04-17 DIAGNOSIS — I340 Nonrheumatic mitral (valve) insufficiency: Secondary | ICD-10-CM | POA: Diagnosis not present

## 2023-04-17 DIAGNOSIS — Z23 Encounter for immunization: Secondary | ICD-10-CM | POA: Diagnosis not present

## 2023-04-17 DIAGNOSIS — R600 Localized edema: Secondary | ICD-10-CM | POA: Diagnosis not present

## 2023-04-24 DIAGNOSIS — Z125 Encounter for screening for malignant neoplasm of prostate: Secondary | ICD-10-CM | POA: Diagnosis not present

## 2023-04-24 DIAGNOSIS — R197 Diarrhea, unspecified: Secondary | ICD-10-CM | POA: Diagnosis not present

## 2023-05-12 DIAGNOSIS — K529 Noninfective gastroenteritis and colitis, unspecified: Secondary | ICD-10-CM | POA: Diagnosis not present

## 2023-05-12 DIAGNOSIS — R152 Fecal urgency: Secondary | ICD-10-CM | POA: Diagnosis not present

## 2023-05-12 DIAGNOSIS — R194 Change in bowel habit: Secondary | ICD-10-CM | POA: Diagnosis not present

## 2023-05-13 DIAGNOSIS — R152 Fecal urgency: Secondary | ICD-10-CM | POA: Diagnosis not present

## 2023-05-13 DIAGNOSIS — K529 Noninfective gastroenteritis and colitis, unspecified: Secondary | ICD-10-CM | POA: Diagnosis not present

## 2023-05-13 DIAGNOSIS — R194 Change in bowel habit: Secondary | ICD-10-CM | POA: Diagnosis not present

## 2023-05-15 IMAGING — US US RENAL
1 series · 14 of 25 positions shown · non-contrast
Comparison: MRI 01/19/2020

CLINICAL DATA: Right renal mass.

EXAM:
RENAL / URINARY TRACT ULTRASOUND COMPLETE

[Series 1: us renal · 14 of 70 slices shown]
[im 1/70]
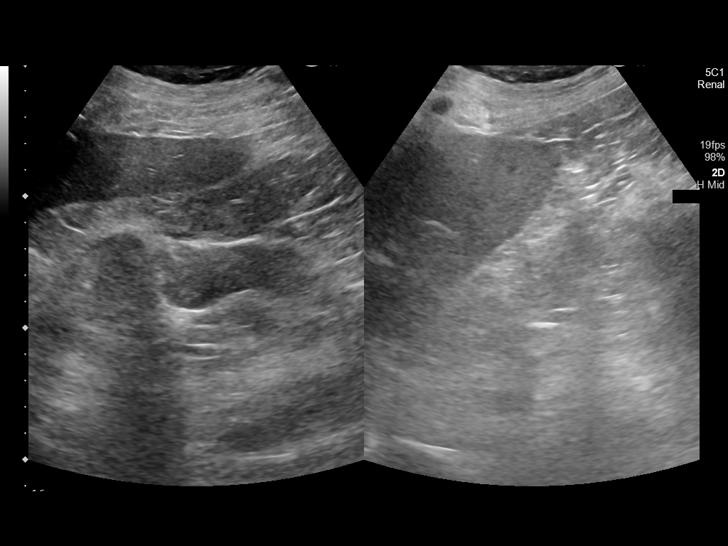
[im 6/70]
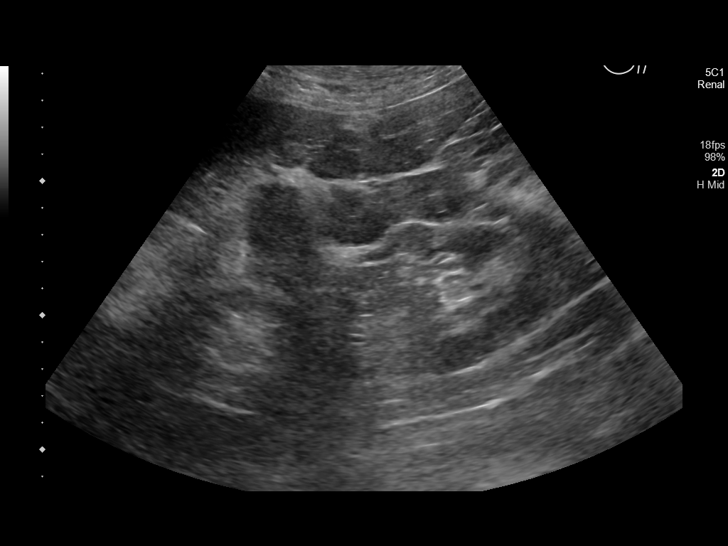
[im 12/70]
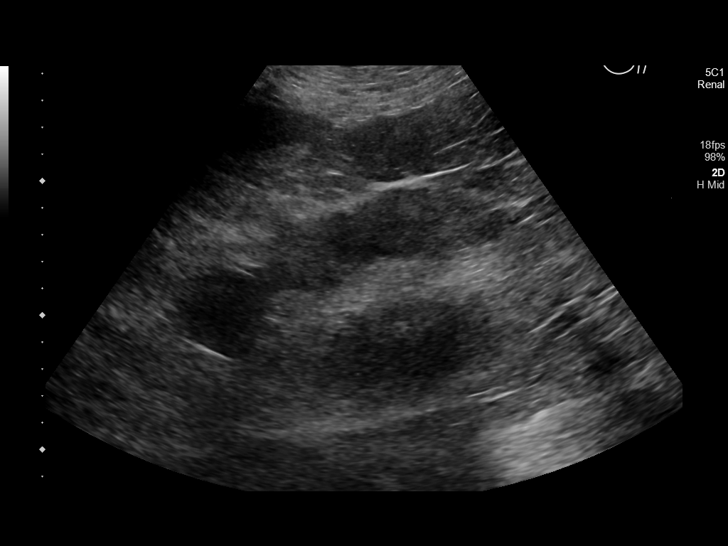
[im 18/70]
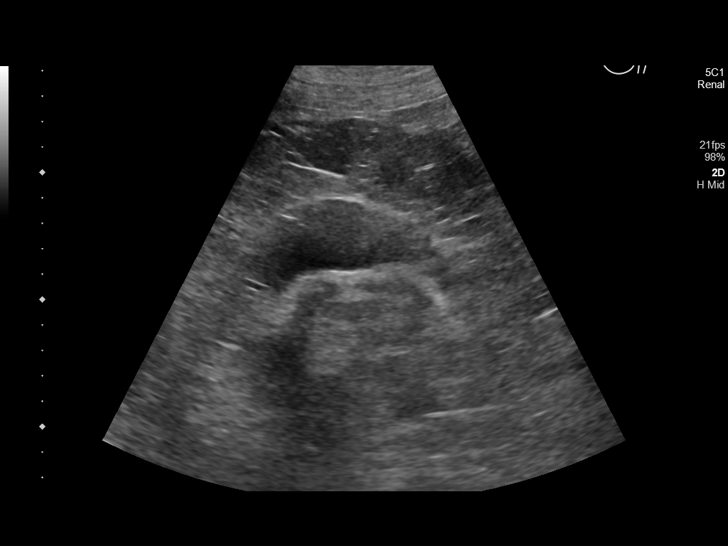
[im 24/70]
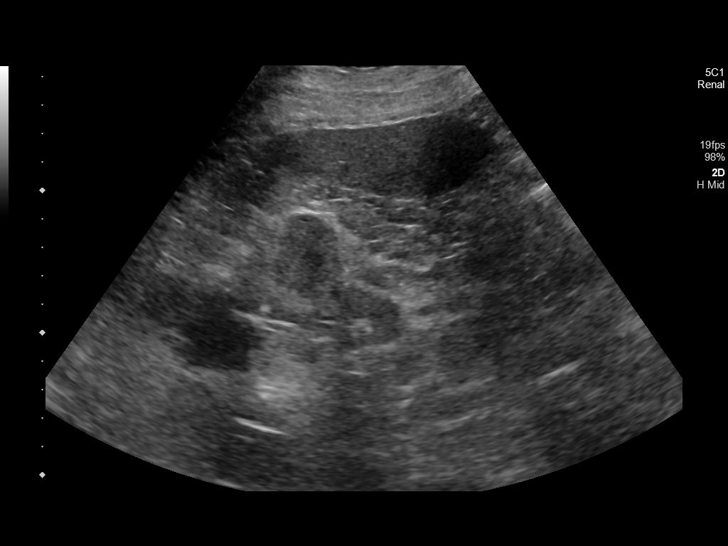
[im 26/70]
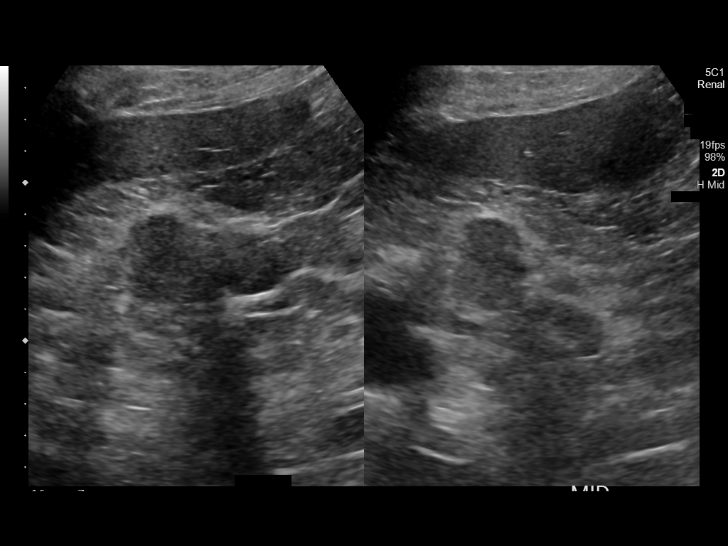
[im 32/70]
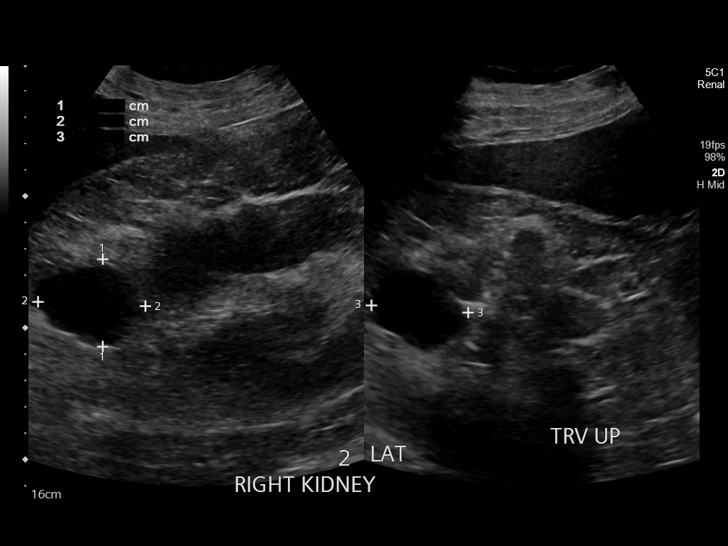
[im 38/70]
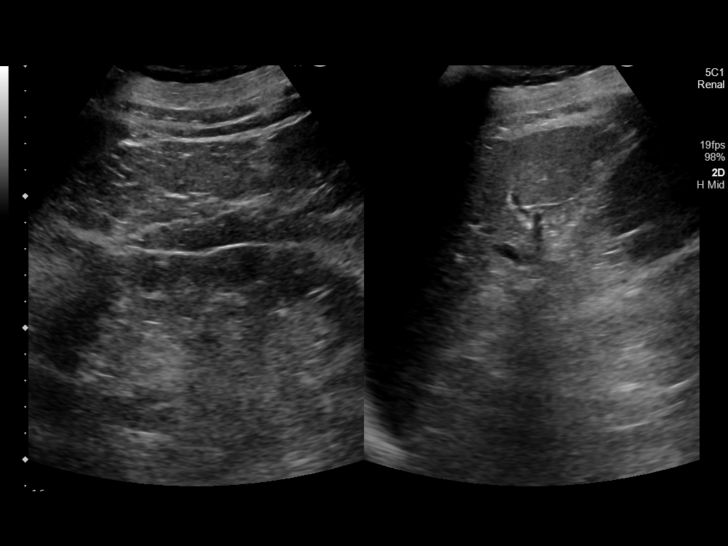
[im 44/70]
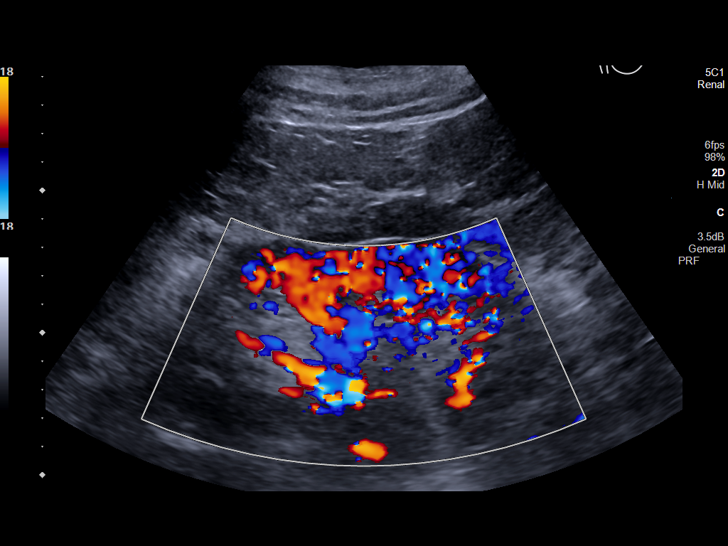
[im 47/70]
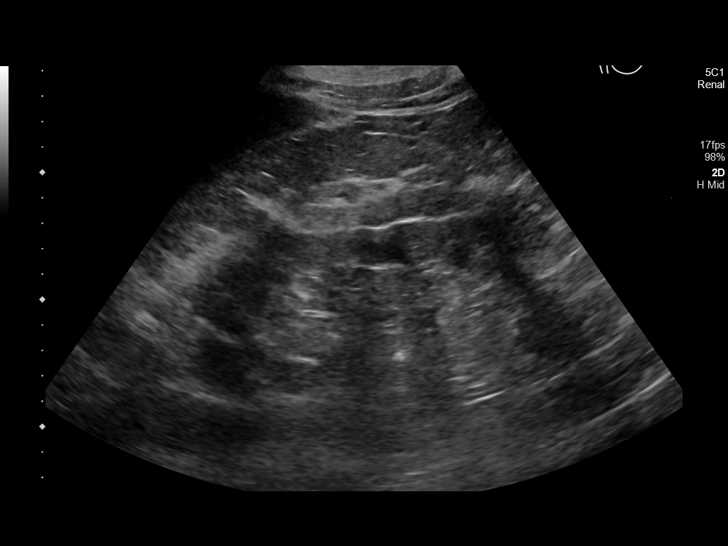
[im 52/70]
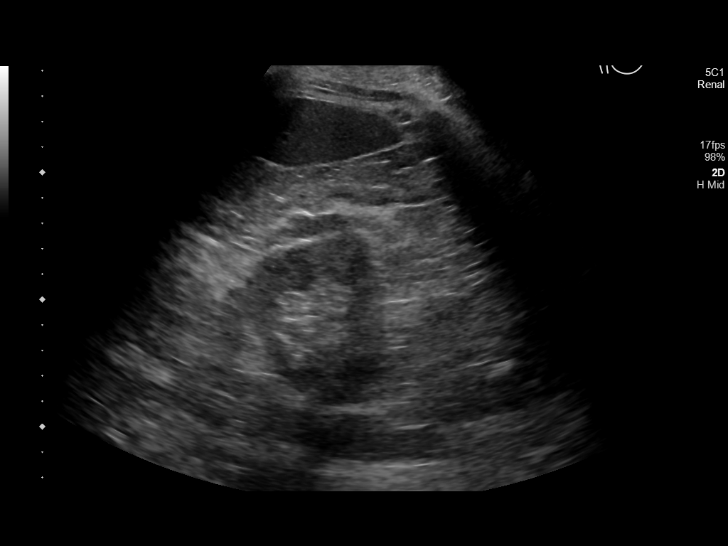
[im 58/70]
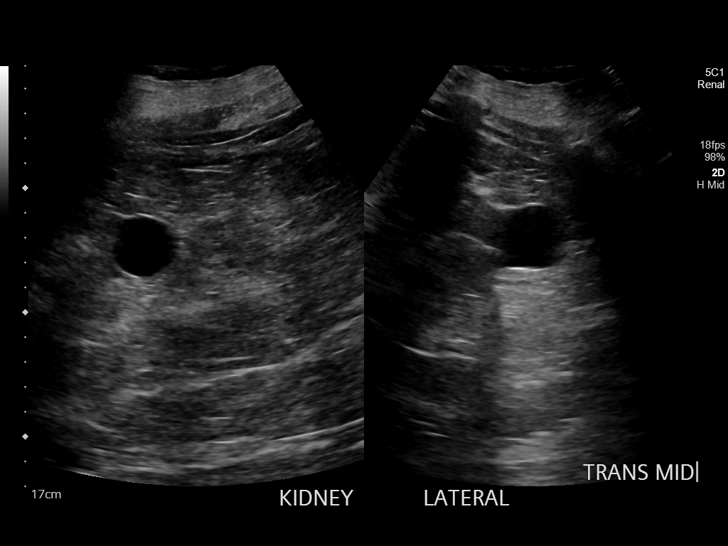
[im 64/70]
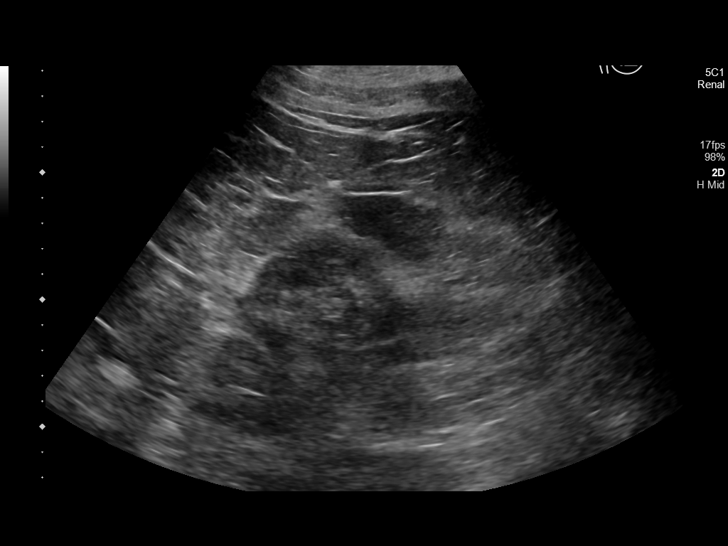
[im 70/70]
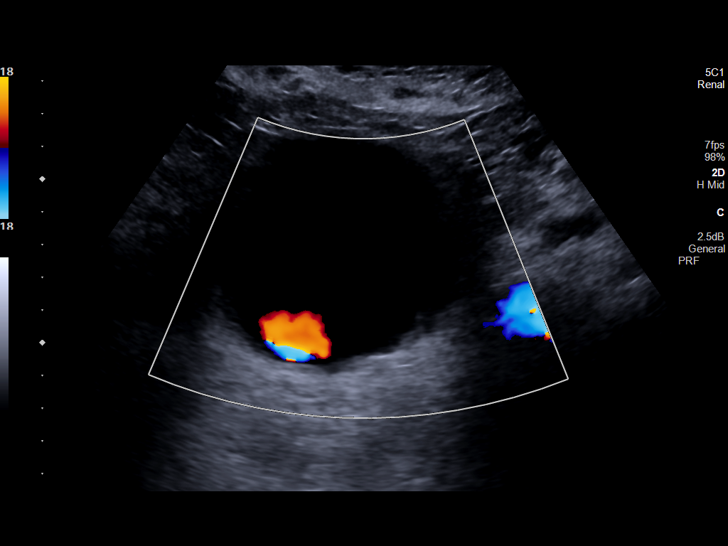

[14 of 25 positions shown; findings below may reference images not displayed]

FINDINGS: Right Kidney:

Renal measurements: 16.4 x 7.6 x 6.8 cm = volume: 443 mL. Multiple
cysts noted in the right kidney with 3.1 x 3.0 x 2.3 cm solid mass
in the interpolar region, compatible with lesion documented on
previous MRI. No evidence for hydronephrosis.

Left Kidney:

Renal measurements: 14.9 x 7.1 x 6.2 cm = volume: 345 mL. Multiple
cysts evident. No hydronephrosis.

Bladder:

Appears normal for degree of bladder distention.

Other:

None.
IMPRESSION: 1. 3.1 cm solid exophytic mass interpolar right kidney, compatible
with description of renal cell carcinoma on previous MRI.
2. Bilateral renal cysts.

## 2023-05-21 DIAGNOSIS — R197 Diarrhea, unspecified: Secondary | ICD-10-CM | POA: Diagnosis not present

## 2023-06-03 ENCOUNTER — Ambulatory Visit: Admitting: General Practice

## 2023-06-03 ENCOUNTER — Encounter: Payer: Self-pay | Admitting: Internal Medicine

## 2023-06-03 ENCOUNTER — Other Ambulatory Visit: Payer: Self-pay

## 2023-06-03 ENCOUNTER — Encounter
Admission: RE | Disposition: A | Discharge status: Home / Self Care | Payer: Self-pay | Source: Home / Self Care | Attending: Internal Medicine

## 2023-06-03 ENCOUNTER — Ambulatory Visit
Admission: RE | Admit: 2023-06-03 | Discharge: 2023-06-03 | Disposition: A | Discharge status: Home / Self Care | Attending: Internal Medicine | Admitting: Internal Medicine

## 2023-06-03 DIAGNOSIS — E782 Mixed hyperlipidemia: Secondary | ICD-10-CM | POA: Diagnosis not present

## 2023-06-03 DIAGNOSIS — J449 Chronic obstructive pulmonary disease, unspecified: Secondary | ICD-10-CM | POA: Insufficient documentation

## 2023-06-03 DIAGNOSIS — K529 Noninfective gastroenteritis and colitis, unspecified: Secondary | ICD-10-CM | POA: Insufficient documentation

## 2023-06-03 DIAGNOSIS — E669 Obesity, unspecified: Secondary | ICD-10-CM | POA: Diagnosis not present

## 2023-06-03 DIAGNOSIS — R197 Diarrhea, unspecified: Secondary | ICD-10-CM | POA: Diagnosis present

## 2023-06-03 DIAGNOSIS — I10 Essential (primary) hypertension: Secondary | ICD-10-CM | POA: Insufficient documentation

## 2023-06-03 DIAGNOSIS — I340 Nonrheumatic mitral (valve) insufficiency: Secondary | ICD-10-CM | POA: Insufficient documentation

## 2023-06-03 DIAGNOSIS — K449 Diaphragmatic hernia without obstruction or gangrene: Secondary | ICD-10-CM | POA: Diagnosis not present

## 2023-06-03 DIAGNOSIS — K5289 Other specified noninfective gastroenteritis and colitis: Secondary | ICD-10-CM | POA: Diagnosis not present

## 2023-06-03 DIAGNOSIS — K635 Polyp of colon: Secondary | ICD-10-CM | POA: Diagnosis not present

## 2023-06-03 DIAGNOSIS — K640 First degree hemorrhoids: Secondary | ICD-10-CM | POA: Diagnosis not present

## 2023-06-03 DIAGNOSIS — Z6836 Body mass index (BMI) 36.0-36.9, adult: Secondary | ICD-10-CM | POA: Insufficient documentation

## 2023-06-03 DIAGNOSIS — D122 Benign neoplasm of ascending colon: Secondary | ICD-10-CM | POA: Insufficient documentation

## 2023-06-03 DIAGNOSIS — K648 Other hemorrhoids: Secondary | ICD-10-CM | POA: Diagnosis not present

## 2023-06-03 DIAGNOSIS — Z87891 Personal history of nicotine dependence: Secondary | ICD-10-CM | POA: Diagnosis not present

## 2023-06-03 DIAGNOSIS — K219 Gastro-esophageal reflux disease without esophagitis: Secondary | ICD-10-CM | POA: Insufficient documentation

## 2023-06-03 HISTORY — PX: COLONOSCOPY: SHX5424

## 2023-06-03 HISTORY — PX: POLYPECTOMY: SHX149

## 2023-06-03 SURGERY — COLONOSCOPY
Anesthesia: General

## 2023-06-03 MED ORDER — PROPOFOL 500 MG/50ML IV EMUL
INTRAVENOUS | Status: DC | PRN
  Administered 2023-06-03: 50 ug/kg/min via INTRAVENOUS

## 2023-06-03 MED ORDER — LIDOCAINE HCL (PF) 2 % IJ SOLN
INTRAMUSCULAR | Status: AC
  Filled 2023-06-03: qty 5

## 2023-06-03 MED ORDER — SODIUM CHLORIDE 0.9 % IV SOLN: INTRAVENOUS | Status: DC

## 2023-06-03 MED ORDER — LIDOCAINE HCL (CARDIAC) PF 100 MG/5ML IV SOSY
PREFILLED_SYRINGE | INTRAVENOUS | Status: DC | PRN
  Administered 2023-06-03: 80 mg via INTRAVENOUS

## 2023-06-03 MED ORDER — PROPOFOL 10 MG/ML IV BOLUS
INTRAVENOUS | Status: DC | PRN
  Administered 2023-06-03: 50 mg via INTRAVENOUS

## 2023-06-03 NOTE — Op Note (Signed)
 [facility] Patient Name: Patrick Thornton Procedure Date: 06/03/2023 1:10 PM MRN: [id number] Account #: [id number] Date of Birth: 09/26/1940 Admit Type: Outpatient Age: 83 Room: [facility] ENDO ROOM 1 Gender: Male Note Status: Finalized Instrument Name: Charlyn Cooley [id number] Procedure:             Colonoscopy Indications:           Diarrhea (presumed secondary to inflammatory bowel                         disease) Providers:             Ravin Denardo K. Author Hatlestad MD, MD Medicines:             Propofol  per Anesthesia Complications:         No immediate complications. Estimated blood loss:                         Minimal. Procedure:             Pre-Anesthesia Assessment:                        - The risks and benefits of the procedure and the                         sedation options and risks were discussed with the                         patient. All questions were answered and informed                         consent was obtained.                        - Patient identification and proposed procedure were                         verified prior to the procedure by the nurse. The                         procedure was verified in the procedure room.                        - ASA Grade Assessment: III - A patient with severe                         systemic disease.                        - After reviewing the risks and benefits, the patient                         was deemed in satisfactory condition to undergo the                         procedure.                        After obtaining informed consent, the colonoscope was  passed under direct vision. Throughout the procedure,                         the patient's blood pressure, pulse, and oxygen                          saturations were monitored continuously. The                         Colonoscope was introduced through the anus and                         advanced to the the terminal  ileum, with                         identification of the appendiceal orifice and IC                         valve. The colonoscopy was performed without                         difficulty. The patient tolerated the procedure well.                         The quality of the bowel preparation was adequate. The                         terminal ileum, ileocecal valve, appendiceal orifice,                         and rectum were photographed. Findings:      The perianal and digital rectal examinations were normal. Pertinent       negatives include normal sphincter tone and no palpable rectal lesions.      Non-bleeding internal hemorrhoids were found during retroflexion. The       hemorrhoids were Grade I (internal hemorrhoids that do not prolapse).      The terminal ileum appeared normal. Biopsies were taken with a cold       forceps for histology. Estimated blood loss was minimal.      Normal mucosa was found in the entire colon. Biopsies for histology were       taken with a cold forceps from the random colon for evaluation of       microscopic colitis. Estimated blood loss was minimal.      A 5 mm polyp was found in the ascending colon. The polyp was sessile.       The polyp was removed with a cold snare. Resection and retrieval were       complete. Estimated blood loss was minimal. Impression:            - Non-bleeding internal hemorrhoids.                        - The examined portion of the ileum was normal.                         Biopsied.                        - Normal mucosa in  the entire examined colon. Biopsied.                        - One 5 mm polyp in the ascending colon, removed with                         a cold snare. Resected and retrieved. Recommendation:        - Patient has a contact number available for                         emergencies. The signs and symptoms of potential                         delayed complications were discussed with the patient.                          Return to normal activities tomorrow. Written                         discharge instructions were provided to the patient.                        - Resume previous diet.                        - Continue present medications.                        - Await pathology results.                        - The findings and recommendations were discussed with                         the patient.                        - If polyps are benign or adenomatous without                         dysplasia, I will advise NO further colonoscopy due to                         advanced age and/or severe comorbidity.                        - Follow up with Corrinne Din, GI Nurse                         Practioner, in office to discuss results and monitor                         progress.                        - Telephone GI office to schedule appointment. Procedure Code(s):     --- Professional ---                        [phone], Colonoscopy, flexible; with removal of  tumor(s), polyp(s), or other lesion(s) by snare                         technique                        [id number], 59, Colonoscopy, flexible; with biopsy, single                         or multiple Diagnosis Code(s):     --- Professional ---                        R19.7, Diarrhea, unspecified                        D12.2, Benign neoplasm of ascending colon                        K64.0, First degree hemorrhoids CPT copyright 2022 American Medical Association. All rights reserved. The codes documented in this report are preliminary and upon coder review may  be revised to meet current compliance requirements. Cassie Click MD, MD 06/03/2023 1:37:50 PM This report has been signed electronically. Number of Addenda: 0 Note Initiated On: 06/03/2023 1:10 PM Scope Withdrawal Time: 0 hours 6 minutes 52 seconds  Total Procedure Duration: 0 hours 9 minutes 7 seconds  Estimated Blood Loss:  Estimated blood loss was  minimal. Estimated blood loss                         was minimal.      [facility]

## 2023-06-03 NOTE — H&P (Signed)
 Outpatient short stay form Pre-procedure 06/03/2023 1:09 PM Amelie Caracci K. Corky Diener, M.D.  Primary Physician: Rory Collard, MD  Reason for visit: Change in bowel habits, diarrhea with urgency  History of present illness:  83 y/o Caucasian male with a PMH of obesity (BMI 36.74), HTN, mixed HLD, mild mitral regurgitation, frequent premature ventricular contractions, OSA not on CPAP, trivial pulmonary HTN, COPD, and OA presents to the [facility] for chief complaint of change in bowel habits x 8-weeks  Change in bowel habits x 8 weeks Chronic diarrhea Fecal urgency  - DDx includes exocrine pancreatic insufficiency, dietary intolerances, functional diarrhea, chronic infections, IBD, microscopic colitis, SIBO, malabsorption syndromes, VIPoma, carcinoid, medication side effects, etc - Recent GI PCR negative for infectious gastroenteritis. Recommend further evaluation with fecal calprotectin, pancreatic elastase, and hydrogen breath test to r/o SIBO.  Recent fecal calprotectin was negative for inflammatory change.   Current Facility-Administered Medications:    0.9 %  sodium chloride  infusion, , Intravenous, Continuous, [place], Saloma Cadena K, MD, Last Rate: 20 mL/hr at 06/03/23 1256, Continued from Pre-op at 06/03/23 1256  Medications Prior to Admission  Medication Sig Dispense Refill Last Dose/Taking   amLODipine (NORVASC) 2.5 MG tablet Take 2.5 mg by mouth daily.   06/02/2023   clobetasol ointment (TEMOVATE) 0.05 % Apply 1 application  topically daily as needed (rash).   Past Week   doxazosin  (CARDURA ) 8 MG tablet Take 1 tablet (8 mg total) by mouth daily. 90 tablet 0 06/02/2023   furosemide (LASIX) 20 MG tablet TAKE 1 TABLET BY MOUTH ONCE DAILY (Patient taking differently: Take 20 mg by mouth daily as needed for fluid.) 90 tablet 3 Past Month   lisinopril  (ZESTRIL ) 20 MG tablet Take 1 tablet (20 mg total) by mouth daily. 90 tablet 3 06/02/2023   Multiple Vitamin tablet Take 1 tablet by mouth  daily.    Past Week   ibuprofen (ADVIL,MOTRIN) 800 MG tablet TAKE 1 TABLET BY MOUTH TWICE A DAY AS NEEDED (Patient not taking: Reported on 11/08/2021) 60 tablet 11    sildenafil  (REVATIO ) 20 MG tablet Take 1-5 tablets by mouth daily. (Patient taking differently: Take 20-100 mg by mouth daily as needed (ED).) 30 tablet 5      Allergies  Allergen Reactions   Gadolinium Derivatives Other (See Comments)    headaches   Tape Rash    Blisters; skin tore off when patient took off electrodes from wearing a holter monitor      Past Medical History:  Diagnosis Date   Arthritis    Cancer of skin of back    Collar bone fracture    x 2   Colon polyp 2003   Dysrhythmia    occasionally skips a beat and has bradycardia(in the30's today)   Edema    legs/feet occas   Headache    History of hiatal hernia    Hypertension    Pre-diabetes    Renal mass, right    Sleep apnea    20 years ago and did not wear cpap long    Review of systems:  Otherwise negative.    Physical Exam  Gen: Alert, oriented. Appears stated age.  HEENT: /AT. PERRLA. Lungs: CTA, no wheezes. CV: RR nl S1, S2. Abd: soft, benign, no masses. BS+ Ext: No edema. Pulses 2+    Planned procedures: Proceed with colonoscopy. The patient understands the nature of the planned procedure, indications, risks, alternatives and potential complications including but not limited to bleeding, infection, perforation, damage to internal organs  and possible oversedation/side effects from anesthesia. The patient agrees and gives consent to proceed.  Please refer to procedure notes for findings, recommendations and patient disposition/instructions.     Shacoria Latif K. Corky Diener, M.D. Gastroenterology 06/03/2023  1:09 PM

## 2023-06-03 NOTE — Transfer of Care (Signed)
 Immediate Anesthesia Transfer of Care Note  Patient: Bart Born  Procedure(s) Performed: COLONOSCOPY POLYPECTOMY, INTESTINE  Patient Location: PACU  Anesthesia Type:General  Level of Consciousness: awake and alert   Airway & Oxygen  Therapy: Patient Spontanous Breathing  Post-op Assessment: Report given to RN and Post -op Vital signs reviewed and stable  Post vital signs: Reviewed and stable  Last Vitals:  Vitals Value Taken Time  BP 146/79 06/03/23 1339  Temp 36.6 C 06/03/23 1337  Pulse 62 06/03/23 1341  Resp 13 06/03/23 1341  SpO2 99 % 06/03/23 1341  Vitals shown include unfiled device data.  Last Pain:  Vitals:   06/03/23 1337  TempSrc:   PainSc: 0-No pain         Complications: No notable events documented.

## 2023-06-03 NOTE — Interval H&P Note (Signed)
 History and Physical Interval Note:  06/03/2023 1:11 PM  Patrick Thornton  has presented today for surgery, with the diagnosis of Elevated fecal calprotectin (R19.5) Change in bowel habits (R19.4) Chronic diarrhea (K52.9) Fecal urgency (R15.2).  The various methods of treatment have been discussed with the patient and family. After consideration of risks, benefits and other options for treatment, the patient has consented to  Procedure(s): COLONOSCOPY (N/A) as a surgical intervention.  The patient's history has been reviewed, patient examined, no change in status, stable for surgery.  I have reviewed the patient's chart and labs.  Questions were answered to the patient's satisfaction.     [place], Patrick Thornton

## 2023-06-03 NOTE — Anesthesia Preprocedure Evaluation (Signed)
 Anesthesia Evaluation  Patient identified by MRN, date of birth, ID band Patient awake    Reviewed: Allergy & Precautions, NPO status , Patient's Chart, lab work & pertinent test results  Airway Mallampati: II  TM Distance: >3 FB Neck ROM: full    Dental  (+) Chipped   Pulmonary neg pulmonary ROS, sleep apnea , former smoker   Pulmonary exam normal breath sounds clear to auscultation       Cardiovascular hypertension, negative cardio ROS Normal cardiovascular exam+ dysrhythmias  Rhythm:Regular Rate:Normal     Neuro/Psych  Headaches negative neurological ROS  negative psych ROS   GI/Hepatic negative GI ROS, Neg liver ROS, hiatal hernia,GERD  ,,Hx noted Dr. Marrie Sizer   Endo/Other  negative endocrine ROS    Renal/GU negative Renal ROS  negative genitourinary   Musculoskeletal   Abdominal   Peds  Hematology negative hematology ROS (+)   Anesthesia Other Findings Past Medical History: No date: Arthritis No date: Cancer of skin of back No date: Collar bone fracture     Comment:  x 2 2003: Colon polyp No date: Dysrhythmia     Comment:  occasionally skips a beat and has bradycardia No date: Edema     Comment:  legs/feet occas No date: Headache No date: History of hiatal hernia No date: Hypertension No date: Pre-diabetes No date: Renal mass, right No date: Sleep apnea     Comment:  20 years ago and did not wear cpap long  Past Surgical History: 2011: BUNIONECTOMY WITH HAMMERTOE RECONSTRUCTION; Left 1970: CHOLECYSTECTOMY 01/09/2016: CIRCUMCISION; N/A     Comment:  Procedure: CIRCUMCISION ADULT;  Surgeon: Dustin Gimenez,              MD;  Location: [facility] ORS;  Service: Urology;  Laterality:               N/A; 03/22/2008: COLONOSCOPY     Comment:  Dr Marquita Situ No date: FOOT SURGERY; Left     Comment:  x 2 pins 1970: FRACTURE SURGERY; Left     Comment:  pins in left big toe and first toe 1970: HERNIA  REPAIR 10/18/2021: IR RADIOLOGIST EVAL & MGMT 12/28/2021: IR RADIOLOGIST EVAL & MGMT 04/12/2022: IR RADIOLOGIST EVAL & MGMT 10/23/2022: IR RADIOLOGIST EVAL & MGMT No date: OTHER SURGICAL HISTORY     Comment:  transanal excision of a villous adenoma 11/21/2021: RADIOLOGY WITH ANESTHESIA; N/A     Comment:  Procedure: RADIOLOGY WITH ANESTHESIA CT MICROWAVE               ABLATION;  Surgeon: Federico Hopkins, MD;  Location: [facility]               ORS;  Service: Radiology;  Laterality: N/A; No date: SKIN CANCER EXCISION     Reproductive/Obstetrics negative OB ROS                             Anesthesia Physical Anesthesia Plan  ASA: 3  Anesthesia Plan: General   Post-op Pain Management: Minimal or no pain anticipated   Induction: Intravenous  PONV Risk Score and Plan: 3 and Propofol  infusion, TIVA and Ondansetron   Airway Management Planned: Nasal Cannula  Additional Equipment: None  Intra-op Plan:   Post-operative Plan:   Informed Consent: I have reviewed the patients History and Physical, chart, labs and discussed the procedure including the risks, benefits and alternatives for the proposed anesthesia with the patient or authorized representative who has indicated  his/her understanding and acceptance.     Dental advisory given  Plan Discussed with: CRNA and Surgeon  Anesthesia Plan Comments: (Discussed risks of anesthesia with patient, including possibility of difficulty with spontaneous ventilation under anesthesia necessitating airway intervention, PONV, and rare risks such as cardiac or respiratory or neurological events, and allergic reactions. Discussed the role of CRNA in patient's perioperative care. Patient understands.)       Anesthesia Quick Evaluation

## 2023-06-04 LAB — SURGICAL PATHOLOGY

## 2023-06-04 NOTE — Anesthesia Postprocedure Evaluation (Signed)
 Anesthesia Post Note  Patient: Patrick Thornton  Procedure(s) Performed: COLONOSCOPY POLYPECTOMY, INTESTINE  Patient location during evaluation: PACU Anesthesia Type: General Level of consciousness: awake and alert, oriented and patient cooperative Pain management: pain level controlled Vital Signs Assessment: post-procedure vital signs reviewed and stable Respiratory status: spontaneous breathing, nonlabored ventilation and respiratory function stable Cardiovascular status: blood pressure returned to baseline and stable Postop Assessment: adequate PO intake Anesthetic complications: no   No notable events documented.   Last Vitals:  Vitals:   06/03/23 1347 06/03/23 1356  BP: (!) 165/71 (!) 155/67  Pulse: (!) 58 65  Resp: 18 18  Temp:    SpO2: 98% 100%    Last Pain:  Vitals:   06/03/23 1356  TempSrc:   PainSc: 0-No pain                 Dorothey Gate

## 2023-07-07 DIAGNOSIS — R7989 Other specified abnormal findings of blood chemistry: Secondary | ICD-10-CM | POA: Diagnosis not present

## 2023-07-07 DIAGNOSIS — I10 Essential (primary) hypertension: Secondary | ICD-10-CM | POA: Diagnosis not present

## 2023-07-07 DIAGNOSIS — R519 Headache, unspecified: Secondary | ICD-10-CM | POA: Diagnosis not present

## 2023-07-07 DIAGNOSIS — K52839 Microscopic colitis, unspecified: Secondary | ICD-10-CM | POA: Diagnosis not present

## 2023-07-07 DIAGNOSIS — C641 Malignant neoplasm of right kidney, except renal pelvis: Secondary | ICD-10-CM | POA: Diagnosis not present

## 2023-07-07 DIAGNOSIS — R197 Diarrhea, unspecified: Secondary | ICD-10-CM | POA: Diagnosis not present

## 2023-07-07 DIAGNOSIS — R7309 Other abnormal glucose: Secondary | ICD-10-CM | POA: Diagnosis not present

## 2023-07-08 DIAGNOSIS — R946 Abnormal results of thyroid function studies: Secondary | ICD-10-CM | POA: Diagnosis not present

## 2023-07-08 DIAGNOSIS — I10 Essential (primary) hypertension: Secondary | ICD-10-CM | POA: Diagnosis not present

## 2023-07-08 DIAGNOSIS — K52839 Microscopic colitis, unspecified: Secondary | ICD-10-CM | POA: Diagnosis not present

## 2023-07-08 DIAGNOSIS — R7309 Other abnormal glucose: Secondary | ICD-10-CM | POA: Diagnosis not present

## 2023-07-08 DIAGNOSIS — R519 Headache, unspecified: Secondary | ICD-10-CM | POA: Diagnosis not present

## 2023-07-09 DIAGNOSIS — H35033 Hypertensive retinopathy, bilateral: Secondary | ICD-10-CM | POA: Diagnosis not present

## 2023-07-09 DIAGNOSIS — H2512 Age-related nuclear cataract, left eye: Secondary | ICD-10-CM | POA: Diagnosis not present

## 2023-07-09 DIAGNOSIS — H04223 Epiphora due to insufficient drainage, bilateral lacrimal glands: Secondary | ICD-10-CM | POA: Diagnosis not present

## 2023-07-09 DIAGNOSIS — H2511 Age-related nuclear cataract, right eye: Secondary | ICD-10-CM | POA: Diagnosis not present

## 2023-07-14 DIAGNOSIS — R519 Headache, unspecified: Secondary | ICD-10-CM | POA: Diagnosis not present

## 2023-07-14 DIAGNOSIS — M5481 Occipital neuralgia: Secondary | ICD-10-CM | POA: Diagnosis not present

## 2023-07-30 DIAGNOSIS — K52832 Lymphocytic colitis: Secondary | ICD-10-CM | POA: Diagnosis not present

## 2023-07-30 DIAGNOSIS — Z860101 Personal history of adenomatous and serrated colon polyps: Secondary | ICD-10-CM | POA: Diagnosis not present

## 2023-09-03 DIAGNOSIS — M19012 Primary osteoarthritis, left shoulder: Secondary | ICD-10-CM | POA: Diagnosis not present

## 2023-09-03 DIAGNOSIS — M19011 Primary osteoarthritis, right shoulder: Secondary | ICD-10-CM | POA: Diagnosis not present

## 2023-10-13 DIAGNOSIS — Z23 Encounter for immunization: Secondary | ICD-10-CM | POA: Diagnosis not present

## 2023-10-24 ENCOUNTER — Encounter: Payer: Self-pay | Admitting: Interventional Radiology

## 2023-10-29 ENCOUNTER — Other Ambulatory Visit: Payer: Self-pay | Admitting: Interventional Radiology

## 2023-10-29 DIAGNOSIS — D3001 Benign neoplasm of right kidney: Secondary | ICD-10-CM

## 2023-10-30 DIAGNOSIS — I340 Nonrheumatic mitral (valve) insufficiency: Secondary | ICD-10-CM | POA: Diagnosis not present

## 2023-10-30 DIAGNOSIS — I493 Ventricular premature depolarization: Secondary | ICD-10-CM | POA: Diagnosis not present

## 2023-10-30 DIAGNOSIS — R001 Bradycardia, unspecified: Secondary | ICD-10-CM | POA: Diagnosis not present

## 2023-10-30 DIAGNOSIS — I10 Essential (primary) hypertension: Secondary | ICD-10-CM | POA: Diagnosis not present

## 2023-11-01 ENCOUNTER — Ambulatory Visit
Admission: RE | Admit: 2023-11-01 | Discharge: 2023-11-01 | Disposition: A | Discharge status: Home / Self Care | Source: Ambulatory Visit | Attending: Interventional Radiology | Admitting: Interventional Radiology

## 2023-11-01 DIAGNOSIS — N281 Cyst of kidney, acquired: Secondary | ICD-10-CM | POA: Diagnosis not present

## 2023-11-01 DIAGNOSIS — R160 Hepatomegaly, not elsewhere classified: Secondary | ICD-10-CM | POA: Diagnosis not present

## 2023-11-01 DIAGNOSIS — N289 Disorder of kidney and ureter, unspecified: Secondary | ICD-10-CM | POA: Diagnosis not present

## 2023-11-01 DIAGNOSIS — Z9049 Acquired absence of other specified parts of digestive tract: Secondary | ICD-10-CM | POA: Diagnosis not present

## 2023-11-01 DIAGNOSIS — D3001 Benign neoplasm of right kidney: Secondary | ICD-10-CM | POA: Diagnosis not present

## 2023-11-01 MED ORDER — GADOBUTROL 1 MMOL/ML IV SOLN
10.0000 mL | Freq: Once | INTRAVENOUS | Status: AC | PRN
  Administered 2023-11-01: 10 mL via INTRAVENOUS

## 2023-11-03 NOTE — Progress Notes (Signed)
 Referring Physician(s): Stoioff,Scott C   Chief Complaint: The patient is seen in follow up today s/p right renal mass microwave ablation 11/21/21  History of present illness: HPI from last clinic visit 10/23/22 Patrick Thornton is a 83 y.o. male with history of renal mass to presents via virtual telephone clinic visit at the gracious referral of Dr. Twylla for consideration of renal mass ablation.  Mr. Illes was first diagnosed with a right renal mass in 2021 after undergoing a renal ultrasound for right flank pain.  This was further evaluated a couple months later with an MR which showed an enhancing exophytic upper pole mass measuring up to 3.2 cm.  The mass has been followed by ultrasound since then, and has grown to 3.7 cm.  Given recent increase size, he is motivated to pursue intervention.  He remains asymptomatic without any right flank or abdominal pain, no hematuria.  No recent fevers, chills, chest pain, shortness of breath, nausea, vomiting, or diarrhea.     The patient presents today via virtual telephone clinic visit for follow up after renal mass microwave ablation on 11/21/21. Pathology resulted as oncocytoma.     He is recovering very well. He denies any back pain, abdominal pain, hematuria, fevers, chills, chest pain, or shortness of breath. He underwent surveillance MRI on 10/19/22. He reports recurring headaches after his MRI, which also happened back in March. He is concerned he may be having a reaction to the contrast.  We made plans to follow up in one year with surveillance imaging and a clinic visit. His MR Abdomen was completed 11/01/23.  Past Medical History:  Diagnosis Date   Arthritis    Cancer of skin of back    Collar bone fracture    x 2   Colon polyp 2003   Dysrhythmia    occasionally skips a beat and has bradycardia(in the30's today)   Edema    legs/feet occas   Headache    History of hiatal hernia    Hypertension    Pre-diabetes    Renal mass, right     Sleep apnea    20 years ago and did not wear cpap long    Past Surgical History:  Procedure Laterality Date   BUNIONECTOMY WITH HAMMERTOE RECONSTRUCTION Left 2011   CHOLECYSTECTOMY  1970   CIRCUMCISION N/A 01/09/2016   Procedure: CIRCUMCISION ADULT;  Surgeon: Rosina Riis, MD;  Location: [facility];  Service: Urology;  Laterality: N/A;   COLONOSCOPY  03/22/2008   Dr Dessa   COLONOSCOPY N/A 06/03/2023   Procedure: COLONOSCOPY;  Surgeon: Toledo, Ladell POUR, MD;  Location: [facility];  Service: Gastroenterology;  Laterality: N/A;   FOOT SURGERY Left    x 2 pins   FRACTURE SURGERY Left 1970   pins in left big toe and first toe   HERNIA REPAIR  1970   IR RADIOLOGIST EVAL & MGMT  10/18/2021   IR RADIOLOGIST EVAL & MGMT  12/28/2021   IR RADIOLOGIST EVAL & MGMT  04/12/2022   IR RADIOLOGIST EVAL & MGMT  10/23/2022   OTHER SURGICAL HISTORY     transanal excision of a villous adenoma   POLYPECTOMY  06/03/2023   Procedure: POLYPECTOMY, INTESTINE;  Surgeon: Aundria, Ladell POUR, MD;  Location: [facility];  Service: Gastroenterology;;   RADIOLOGY WITH ANESTHESIA N/A 11/21/2021   Procedure: RADIOLOGY WITH ANESTHESIA CT MICROWAVE ABLATION;  Surgeon: Jennefer Ester PARAS, MD;  Location: [facility];  Service: Radiology;  Laterality: N/A;   SKIN CANCER EXCISION  Allergies: Gadolinium derivatives and Tape  Medications: Prior to Admission medications   Medication Sig Start Date End Date Taking? Authorizing Provider  amLODipine (NORVASC) 2.5 MG tablet Take 2.5 mg by mouth daily. 10/13/19   [provider]  clobetasol ointment (TEMOVATE) 0.05 % Apply 1 application  topically daily as needed (rash). 12/03/18   [provider]  doxazosin  (CARDURA ) 8 MG tablet Take 1 tablet (8 mg total) by mouth daily. 12/06/21   Simmons-Robinson, Makiera, MD  furosemide (LASIX) 20 MG tablet TAKE 1 TABLET BY MOUTH ONCE DAILY Patient taking differently: Take 20 mg by mouth daily as needed for fluid.  12/05/20   Bertrum Charlie CROME, MD  ibuprofen (ADVIL,MOTRIN) 800 MG tablet TAKE 1 TABLET BY MOUTH TWICE A DAY AS NEEDED Patient not taking: Reported on 11/08/2021 11/29/15   Bertrum Charlie CROME, MD  lisinopril  (ZESTRIL ) 20 MG tablet Take 1 tablet (20 mg total) by mouth daily. 08/06/21   Bertrum Charlie CROME, MD  Multiple Vitamin tablet Take 1 tablet by mouth daily.  07/30/11   [provider]  sildenafil  (REVATIO ) 20 MG tablet Take 1-5 tablets by mouth daily. Patient taking differently: Take 20-100 mg by mouth daily as needed (ED). 07/11/21   Bertrum Charlie CROME, MD     Family History  Problem Relation Age of Onset   Stroke Mother    Cancer Father        melanoma   Diabetes Father    Hypertension Father    Heart attack Father    Hyperlipidemia Brother    Heart attack Brother    Diabetes Son    COPD Son    Obesity Son    Heart attack Son    Hepatitis C Son    Cirrhosis Son    Kidney failure Son    CAD Brother    Diverticulitis Daughter    Heart disease Daughter    Heart attack Daughter     Social History   Socioeconomic History   Marital status: Widowed    Spouse name: Kyra   Number of children: 4   Years of education: Not on file   Highest education level: Some college, no degree  Occupational History   Occupation: retired  Tobacco Use   Smoking status: Former    Current packs/day: 0.00    Average packs/day: 1 pack/day for 40.0 years (40.0 ttl pk-yrs)    Types: Cigarettes    Start date: 01/04/1954    Quit date: 01/04/1994    Years since quitting: 29.8   Smokeless tobacco: Never  Vaping Use   Vaping status: Never Used  Substance and Sexual Activity   Alcohol  use: No   Drug use: No   Sexual activity: Never  Other Topics Concern   Not on file  Social History Narrative   Not on file   Social Drivers of Health   Financial Resource Strain: Low Risk  (07/14/2023)   Received from [facility]   Overall Financial Resource Strain (CARDIA)     Difficulty of Paying Living Expenses: Not hard at all  Food Insecurity: No Food Insecurity (07/14/2023)   Received [facility]   Hunger Vital Sign    Within the past 12 months, you worried that your food would run out before you got the money to buy more.: Never true    Within the past 12 months, the food you bought just didn't last and you didn't have money to get more.: Never true  Transportation  Needs: No Transportation Needs (07/14/2023)   Received from [facility] - Transportation    In the past 12 months, has lack of transportation kept you from medical appointments or from getting medications?: No    Lack of Transportation (Non-Medical): No  Physical Activity: Insufficiently Active (02/07/2021)   Exercise Vital Sign    Days of Exercise per Week: 4 days    Minutes of Exercise per Session: 30 min  Stress: No Stress Concern Present (02/07/2021)   [employer] - Occupational Stress Questionnaire    Feeling of Stress : Not at all  Social Connections: Moderately Isolated (02/07/2021)   Social Connection and Isolation Panel    Frequency of Communication with Friends and Family: More than three times a week    Frequency of Social Gatherings with Friends and Family: Once a week    Attends Religious Services: Never    Database administrator or Organizations: Yes    Attends Banker Meetings: Never    Marital Status: Widowed     Vital Signs: There were no vitals taken for this visit.  Physical Exam  Imaging: MR 01/19/20   3.2 cm enhancing, exophytic mass about lateral upper pole of right kidney   US  Renal 09/28/21    MR abdomen 12/24/21  Right renal mass s/p ablation, no evidence of residual enhancement.   MR abdomen 04/02/22  Right renal mass s/p ablation, no evidence of residual enhancement.   MR abdomen 10/19/22  Right renal mass s/p ablation, no evidence of residual enhancement.     Labs:  CBC: No results for input(s): WBC, HGB, HCT, PLT in the last 8760 hours.  COAGS: No results for input(s): INR, APTT in the last 8760 hours.  BMP: No results for input(s): NA, K, CL, CO2, GLUCOSE, BUN, CALCIUM, CREATININE, GFRNONAA, GFRAA in the last 8760 hours.  Invalid input(s): CMP  LIVER FUNCTION TESTS: No results for input(s): BILITOT, AST, ALT, ALKPHOS, PROT, ALBUMIN in the last 8760 hours.  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    Ester Sides, MD Pager: [phone]    I spent a total of 25 Minutes in face to face in clinical consultation, greater than 50% of which was counseling/coordinating care for right renal oncocytic neoplasm.

## 2023-11-05 ENCOUNTER — Inpatient Hospital Stay
Admission: RE | Admit: 2023-11-05 | Discharge: 2023-11-05 | Disposition: A | Source: Ambulatory Visit | Attending: Interventional Radiology

## 2023-11-05 DIAGNOSIS — D3001 Benign neoplasm of right kidney: Secondary | ICD-10-CM | POA: Diagnosis not present

## 2023-11-05 HISTORY — PX: IR RADIOLOGIST EVAL & MGMT: IMG5224
